# Patient Record
Sex: Male | Born: 1960
Health system: Southern US, Community
[De-identification: ages and names within clinical notes are randomized; demographics above are authoritative.]

## PROBLEM LIST (undated history)

## (undated) DIAGNOSIS — J45909 Unspecified asthma, uncomplicated: Secondary | ICD-10-CM

## (undated) DIAGNOSIS — M8700 Idiopathic aseptic necrosis of unspecified bone: Secondary | ICD-10-CM

## (undated) DIAGNOSIS — I10 Essential (primary) hypertension: Secondary | ICD-10-CM

## (undated) DIAGNOSIS — R519 Headache, unspecified: Secondary | ICD-10-CM

## (undated) DIAGNOSIS — M109 Gout, unspecified: Secondary | ICD-10-CM

## (undated) DIAGNOSIS — K219 Gastro-esophageal reflux disease without esophagitis: Secondary | ICD-10-CM

## (undated) DIAGNOSIS — J449 Chronic obstructive pulmonary disease, unspecified: Secondary | ICD-10-CM

## (undated) DIAGNOSIS — R51 Headache: Secondary | ICD-10-CM

## (undated) DIAGNOSIS — I509 Heart failure, unspecified: Secondary | ICD-10-CM

## (undated) DIAGNOSIS — M9390 Osteochondropathy, unspecified of unspecified site: Secondary | ICD-10-CM

## (undated) DIAGNOSIS — G4700 Insomnia, unspecified: Secondary | ICD-10-CM

## (undated) DIAGNOSIS — B182 Chronic viral hepatitis C: Secondary | ICD-10-CM

## (undated) DIAGNOSIS — T7840XA Allergy, unspecified, initial encounter: Secondary | ICD-10-CM

## (undated) DIAGNOSIS — M1610 Unilateral primary osteoarthritis, unspecified hip: Secondary | ICD-10-CM

## (undated) HISTORY — DX: Osteochondropathy, unspecified of unspecified site: M93.90

## (undated) HISTORY — DX: Chronic obstructive pulmonary disease, unspecified: J44.9

## (undated) HISTORY — DX: Idiopathic aseptic necrosis of unspecified bone: M87.00

## (undated) HISTORY — DX: Insomnia, unspecified: G47.00

## (undated) HISTORY — DX: Allergy, unspecified, initial encounter: T78.40XA

## (undated) HISTORY — DX: Gout, unspecified: M10.9

## (undated) HISTORY — DX: Essential (primary) hypertension: I10

## (undated) HISTORY — DX: Chronic viral hepatitis C: B18.2

---

## 2013-10-08 DIAGNOSIS — M8700 Idiopathic aseptic necrosis of unspecified bone: Secondary | ICD-10-CM

## 2013-10-08 HISTORY — DX: Idiopathic aseptic necrosis of unspecified bone: M87.00

## 2016-08-08 DEATH — deceased

## 2016-10-08 DIAGNOSIS — I509 Heart failure, unspecified: Secondary | ICD-10-CM

## 2016-10-08 HISTORY — DX: Heart failure, unspecified: I50.9

## 2017-07-12 ENCOUNTER — Encounter: Payer: Self-pay | Admitting: Family Medicine

## 2017-07-12 ENCOUNTER — Ambulatory Visit (INDEPENDENT_AMBULATORY_CARE_PROVIDER_SITE_OTHER): Payer: Medicaid Other | Admitting: Family Medicine

## 2017-07-12 VITALS — BP 136/90 | HR 67 | Temp 98.7°F | Resp 14 | Ht 67.0 in | Wt 117.0 lb

## 2017-07-12 DIAGNOSIS — Z23 Encounter for immunization: Secondary | ICD-10-CM | POA: Diagnosis not present

## 2017-07-12 DIAGNOSIS — Z8619 Personal history of other infectious and parasitic diseases: Secondary | ICD-10-CM

## 2017-07-12 DIAGNOSIS — Z13220 Encounter for screening for lipoid disorders: Secondary | ICD-10-CM | POA: Diagnosis not present

## 2017-07-12 DIAGNOSIS — Z131 Encounter for screening for diabetes mellitus: Secondary | ICD-10-CM

## 2017-07-12 DIAGNOSIS — Z125 Encounter for screening for malignant neoplasm of prostate: Secondary | ICD-10-CM

## 2017-07-12 DIAGNOSIS — Z1329 Encounter for screening for other suspected endocrine disorder: Secondary | ICD-10-CM | POA: Diagnosis not present

## 2017-07-12 DIAGNOSIS — J449 Chronic obstructive pulmonary disease, unspecified: Secondary | ICD-10-CM

## 2017-07-12 DIAGNOSIS — I10 Essential (primary) hypertension: Secondary | ICD-10-CM | POA: Diagnosis not present

## 2017-07-12 DIAGNOSIS — I509 Heart failure, unspecified: Secondary | ICD-10-CM | POA: Diagnosis not present

## 2017-07-12 MED ORDER — FLUTICASONE PROPIONATE (INHAL) 50 MCG/BLIST IN AEPB
1.0000 | INHALATION_SPRAY | Freq: Two times a day (BID) | RESPIRATORY_TRACT | 12 refills | Status: DC
Start: 2017-07-12 — End: 2017-07-16

## 2017-07-12 MED ORDER — ALBUTEROL SULFATE HFA 108 (90 BASE) MCG/ACT IN AERS: 2.0000 | INHALATION_SPRAY | RESPIRATORY_TRACT | 1 refills | Status: DC | PRN

## 2017-07-12 MED ORDER — CLONIDINE HCL 0.1 MG PO TABS
0.1000 mg | ORAL_TABLET | Freq: Once | ORAL | Status: AC
  Administered 2017-07-12: 0.1 mg via ORAL

## 2017-07-12 MED ORDER — LOSARTAN POTASSIUM 50 MG PO TABS: 50.0000 mg | ORAL_TABLET | Freq: Every day | ORAL | 3 refills | Status: DC

## 2017-07-12 MED ORDER — HYDROCHLOROTHIAZIDE 25 MG PO TABS: 25.0000 mg | ORAL_TABLET | Freq: Every day | ORAL | 3 refills | Status: DC
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
## 2017-07-12 NOTE — Patient Instructions (Signed)
COPD Continue albuterol inhaler 2 puffs every 4 hours as needed for shortness of breath or wheezing. Start Flovent inhaler 1 puff once daily, use everyday regardless of symptoms.  Hypertension Start Losartan and Hydrochlorothiazide .    Chronic Obstructive Pulmonary Disease Chronic obstructive pulmonary disease (COPD) is a long-term (chronic) lung problem. When you have COPD, it is hard for air to get in and out of your lungs. The way your lungs work will never return to normal. Usually the condition gets worse over time. There are things you can do to keep yourself as healthy as possible. Your doctor may treat your condition with:  Medicines.  Quitting smoking, if you smoke.  Rehabilitation. This may involve a team of specialists.  Oxygen.  Exercise and changes to your diet.  Lung surgery.  Comfort measures (palliative care).  Follow these instructions at home: Medicines  Take over-the-counter and prescription medicines only as told by your doctor.  Talk to your doctor before taking any cough or allergy medicines. You may need to avoid medicines that cause your lungs to be dry. Lifestyle  If you smoke, stop. Smoking makes the problem worse. If you need help quitting, ask your doctor.  Avoid being around things that make your breathing worse. This may include smoke, chemicals, and fumes.  Stay active, but remember to also rest.  Learn and use tips on how to relax.  Make sure you get enough sleep. Most adults need at least 7 hours a night.  Eat healthy foods. Eat smaller meals more often. Rest before meals. Controlled breathing  Learn and use tips on how to control your breathing as told by your doctor. Try: ? Breathing in (inhaling) through your nose for 1 second. Then, pucker your lips and breath out (exhale) through your lips for 2 seconds. ? Putting one hand on your belly (abdomen). Breathe in slowly through your nose for 1 second. Your hand on your belly should  move out. Pucker your lips and breathe out slowly through your lips. Your hand on your belly should move in as you breathe out. Controlled coughing  Learn and use controlled coughing to clear mucus from your lungs. The steps are: 1. Lean your head a little forward. 2. Breathe in deeply. 3. Try to hold your breath for 3 seconds. 4. Keep your mouth slightly open while coughing 2 times. 5. Spit any mucus out into a tissue. 6. Rest and do the steps again 1 or 2 times as needed. General instructions  Make sure you get all the shots (vaccines) that your doctor recommends. Ask your doctor about a flu shot and a pneumonia shot.  Use oxygen therapy and therapy to help improve your lungs (pulmonary rehabilitation) if told by your doctor. If you need home oxygen therapy, ask your doctor if you should buy a tool to measure your oxygen level (oximeter).  Make a COPD action plan with your doctor. This helps you know what to do if you feel worse than usual.  Manage any other conditions you have as told by your doctor.  Avoid going outside when it is very hot, cold, or humid.  Avoid people who have a sickness you can catch (contagious).  Keep all follow-up visits as told by your doctor. This is important. Contact a doctor if:  You cough up more mucus than usual.  There is a change in the color or thickness of the mucus.  It is harder to breathe than usual.  Your breathing is faster than  usual.  You have trouble sleeping.  You need to use your medicines more often than usual.  You have trouble doing your normal activities such as getting dressed or walking around the house. Get help right away if:  You have shortness of breath while resting.  You have shortness of breath that stops you from: ? Being able to talk. ? Doing normal activities.  Your chest hurts for longer than 5 minutes.  Your skin color is more blue than usual.  Your pulse oximeter shows that you have low oxygen for  longer than 5 minutes.  You have a fever.  You feel too tired to breathe normally. Summary  Chronic obstructive pulmonary disease (COPD) is a long-term lung problem.  The way your lungs work will never return to normal. Usually the condition gets worse over time. There are things you can do to keep yourself as healthy as possible.  Take over-the-counter and prescription medicines only as told by your doctor.  If you smoke, stop. Smoking makes the problem worse. This information is not intended to replace advice given to you by your health care provider. Make sure you discuss any questions you have with your health care provider. Document Released: 03/12/2008 Document Revised: 03/01/2016 Document Reviewed: 05/21/2013 Elsevier Interactive Patient Education  2017 [employer].

## 2017-07-13 LAB — BRAIN NATRIURETIC PEPTIDE: BRAIN NATRIURETIC PEPTIDE: 63 pg/mL (ref <100)

## 2017-07-16 ENCOUNTER — Telehealth: Payer: Self-pay

## 2017-07-16 ENCOUNTER — Other Ambulatory Visit: Payer: Self-pay | Admitting: Family Medicine

## 2017-07-16 MED ORDER — BUDESONIDE-FORMOTEROL FUMARATE 80-4.5 MCG/ACT IN AERO: 2.0000 | INHALATION_SPRAY | Freq: Two times a day (BID) | RESPIRATORY_TRACT | 12 refills | Status: DC

## 2017-07-16 NOTE — Telephone Encounter (Signed)
Insurance doesn't cover the [employer]

## 2017-07-16 NOTE — Progress Notes (Signed)
I have ordered Symbicort which according to [facility] list is covered .

## 2017-07-18 ENCOUNTER — Telehealth: Payer: Self-pay | Admitting: Family Medicine

## 2017-07-18 DIAGNOSIS — B182 Chronic viral hepatitis C: Secondary | ICD-10-CM

## 2017-07-18 NOTE — Telephone Encounter (Signed)
Contact patient to advise that I did confirm that he has hep C. He is being referred to infectious disease for further evaluation and treatment. Liver enzymes were abnormally elevated with recent lab work , please schedule him for a liver ultrasound and notify patient time for test.  Charles [name]. [name], MSN, FNP-C [facility]  [address] [place], [place] [address] [phone]

## 2017-07-18 NOTE — Telephone Encounter (Signed)
Letter will be sent out to patient to contact office as he does not have any phone numbers to contact him on.

## 2017-07-20 ENCOUNTER — Encounter: Payer: Self-pay | Admitting: Family Medicine

## 2017-07-20 DIAGNOSIS — Z8619 Personal history of other infectious and parasitic diseases: Secondary | ICD-10-CM | POA: Insufficient documentation

## 2017-07-20 DIAGNOSIS — J449 Chronic obstructive pulmonary disease, unspecified: Secondary | ICD-10-CM | POA: Insufficient documentation

## 2017-07-20 DIAGNOSIS — I10 Essential (primary) hypertension: Secondary | ICD-10-CM | POA: Insufficient documentation

## 2017-07-20 DIAGNOSIS — I509 Heart failure, unspecified: Secondary | ICD-10-CM | POA: Insufficient documentation

## 2017-07-23 LAB — HCV RNA,LIPA RFLX NS5A DRUG RESIST

## 2017-07-23 LAB — LIPID PANEL
CHOLESTEROL: 153 mg/dL (ref <200)
HDL: 104 mg/dL (ref >40)
LDL CHOLESTEROL (CALC): 32 mg/dL
Non-HDL Cholesterol (Calc): 49 mg/dL (calc) (ref <130)
Total CHOL/HDL Ratio: 1.5 (calc) (ref <5.0)
Triglycerides: 88 mg/dL (ref <150)

## 2017-07-23 LAB — COMPLETE METABOLIC PANEL WITH GFR
AG Ratio: 1 (calc) (ref 1.0–2.5)
ALKALINE PHOSPHATASE (APISO): 108 U/L (ref 40–115)
ALT: 103 U/L — AB (ref 9–46)
AST: 170 U/L — AB (ref 10–35)
Albumin: 3.8 g/dL (ref 3.6–5.1)
BUN/Creatinine Ratio: 13 (calc) (ref 6–22)
BUN: 9 mg/dL (ref 7–25)
CALCIUM: 9 mg/dL (ref 8.6–10.3)
CO2: 29 mmol/L (ref 20–32)
CREATININE: 0.69 mg/dL — AB (ref 0.70–1.33)
Chloride: 102 mmol/L (ref 98–110)
GFR, EST NON AFRICAN AMERICAN: 105 mL/min/{1.73_m2} (ref >=60)
GFR, Est African American: 122 mL/min/{1.73_m2} (ref >=60)
GLUCOSE: 109 mg/dL — AB (ref 65–99)
Globulin: 3.8 g/dL (calc) — ABNORMAL HIGH (ref 1.9–3.7)
Potassium: 4.1 mmol/L (ref 3.5–5.3)
SODIUM: 137 mmol/L (ref 135–146)
Total Bilirubin: 0.7 mg/dL (ref 0.2–1.2)
Total Protein: 7.6 g/dL (ref 6.1–8.1)

## 2017-07-23 LAB — CBC WITH DIFFERENTIAL/PLATELET
BASOS PCT: 1.4 %
Basophils Absolute: 39 cells/uL (ref 0–200)
EOS PCT: 1.1 %
Eosinophils Absolute: 31 cells/uL (ref 15–500)
HCT: 37.7 % — ABNORMAL LOW (ref 38.5–50.0)
Hemoglobin: 13.1 g/dL — ABNORMAL LOW (ref 13.2–17.1)
LYMPHS ABS: 1114 {cells}/uL (ref 850–3900)
MCH: 34.3 pg — ABNORMAL HIGH (ref 27.0–33.0)
MCHC: 34.7 g/dL (ref 32.0–36.0)
MCV: 98.7 fL (ref 80.0–100.0)
MPV: 9.7 fL (ref 7.5–12.5)
Monocytes Relative: 16.5 %
NEUTROS PCT: 41.2 %
Neutro Abs: 1154 cells/uL — ABNORMAL LOW (ref 1500–7800)
PLATELETS: 229 10*3/uL (ref 140–400)
RBC: 3.82 10*6/uL — ABNORMAL LOW (ref 4.20–5.80)
RDW: 13.9 % (ref 11.0–15.0)
TOTAL LYMPHOCYTE: 39.8 %
WBC: 2.8 10*3/uL — AB (ref 3.8–10.8)
WBCMIX: 462 {cells}/uL (ref 200–950)

## 2017-07-23 LAB — BRAIN NATRIURETIC PEPTIDE: Brain Natriuretic Peptide: 64 pg/mL (ref <100)

## 2017-07-23 LAB — HCV RNA NS5A DRUG RESISTANCE

## 2017-07-23 LAB — HEMOGLOBIN A1C
EAG (MMOL/L): 5.2 (calc)
HEMOGLOBIN A1C: 4.9 %{Hb} (ref <5.7)
MEAN PLASMA GLUCOSE: 94 (calc)

## 2017-07-23 LAB — THYROID PANEL WITH TSH
FREE THYROXINE INDEX: 2.1 (ref 1.4–3.8)
T3 UPTAKE: 33 % (ref 22–35)
T4 TOTAL: 6.5 ug/dL (ref 4.9–10.5)
TSH: 1.25 mIU/L (ref 0.40–4.50)

## 2017-07-23 LAB — HCV RNA, QUANT REAL-TIME PCR W/REFLEX
HCV RNA, PCR, QN (Log): 6.55 LogIU/mL — ABNORMAL HIGH
HCV RNA, PCR, QN: 3580000 [IU]/mL — AB

## 2017-07-23 LAB — PSA: PSA: 0.4 ng/mL (ref <=4.0)

## 2017-07-30 NOTE — Telephone Encounter (Signed)
Patient notified and will contact infectious disease to schedule appointment. Authorization has been submitted for the [place] and has to be reviewed

## 2017-08-05 ENCOUNTER — Ambulatory Visit (HOSPITAL_COMMUNITY): Admission: RE | Admit: 2017-08-05 | Payer: Medicaid Other | Source: Ambulatory Visit

## 2017-08-09 ENCOUNTER — Encounter: Payer: Self-pay | Admitting: Family Medicine

## 2017-08-09 ENCOUNTER — Ambulatory Visit (INDEPENDENT_AMBULATORY_CARE_PROVIDER_SITE_OTHER): Payer: Medicaid Other | Admitting: Family Medicine

## 2017-08-09 ENCOUNTER — Ambulatory Visit (HOSPITAL_COMMUNITY)
Admission: RE | Admit: 2017-08-09 | Discharge: 2017-08-09 | Disposition: A | Discharge status: Home / Self Care | Payer: Medicaid Other | Source: Ambulatory Visit | Attending: Family Medicine | Admitting: Family Medicine

## 2017-08-09 VITALS — BP 124/90 | HR 94 | Temp 98.6°F | Ht 67.0 in | Wt 124.0 lb

## 2017-08-09 DIAGNOSIS — M87852 Other osteonecrosis, left femur: Secondary | ICD-10-CM | POA: Insufficient documentation

## 2017-08-09 DIAGNOSIS — G8929 Other chronic pain: Secondary | ICD-10-CM

## 2017-08-09 DIAGNOSIS — M87052 Idiopathic aseptic necrosis of left femur: Secondary | ICD-10-CM | POA: Diagnosis not present

## 2017-08-09 DIAGNOSIS — I10 Essential (primary) hypertension: Secondary | ICD-10-CM

## 2017-08-09 DIAGNOSIS — Z23 Encounter for immunization: Secondary | ICD-10-CM

## 2017-08-09 DIAGNOSIS — M25561 Pain in right knee: Secondary | ICD-10-CM | POA: Diagnosis present

## 2017-08-09 DIAGNOSIS — M25552 Pain in left hip: Secondary | ICD-10-CM | POA: Diagnosis present

## 2017-08-09 DIAGNOSIS — J449 Chronic obstructive pulmonary disease, unspecified: Secondary | ICD-10-CM | POA: Diagnosis not present

## 2017-08-09 MED ORDER — LOSARTAN POTASSIUM 50 MG PO TABS: 50.0000 mg | ORAL_TABLET | Freq: Every day | ORAL | 3 refills | Status: DC

## 2017-08-09 MED ORDER — ALBUTEROL SULFATE HFA 108 (90 BASE) MCG/ACT IN AERS: 2.0000 | INHALATION_SPRAY | RESPIRATORY_TRACT | 3 refills | Status: DC | PRN

## 2017-08-09 MED ORDER — MELOXICAM 15 MG PO TABS: 15.0000 mg | ORAL_TABLET | Freq: Every day | ORAL | 0 refills | Status: DC

## 2017-08-09 MED ORDER — HYDROCHLOROTHIAZIDE 25 MG PO TABS: 25.0000 mg | ORAL_TABLET | Freq: Every day | ORAL | 3 refills | Status: DC

## 2017-08-09 NOTE — Patient Instructions (Signed)
I have prescribed Meloxicam for you leg and hip pain.  You will be notified by mail regarding your referral to orthopedics.    Chronic Obstructive Pulmonary Disease Chronic obstructive pulmonary disease (COPD) is a long-term (chronic) lung problem. When you have COPD, it is hard for air to get in and out of your lungs. The way your lungs work will never return to normal. Usually the condition gets worse over time. There are things you can do to keep yourself as healthy as possible. Your doctor may treat your condition with:  Medicines.  Quitting smoking, if you smoke.  Rehabilitation. This may involve a team of specialists.  Oxygen.  Exercise and changes to your diet.  Lung surgery.  Comfort measures (palliative care).  Follow these instructions at home: Medicines  Take over-the-counter and prescription medicines only as told by your doctor.  Talk to your doctor before taking any cough or allergy medicines. You may need to avoid medicines that cause your lungs to be dry. Lifestyle  If you smoke, stop. Smoking makes the problem worse. If you need help quitting, ask your doctor.  Avoid being around things that make your breathing worse. This may include smoke, chemicals, and fumes.  Stay active, but remember to also rest.  Learn and use tips on how to relax.  Make sure you get enough sleep. Most adults need at least 7 hours a night.  Eat healthy foods. Eat smaller meals more often. Rest before meals. Controlled breathing  Learn and use tips on how to control your breathing as told by your doctor. Try: ? Breathing in (inhaling) through your nose for 1 second. Then, pucker your lips and breath out (exhale) through your lips for 2 seconds. ? Putting one hand on your belly (abdomen). Breathe in slowly through your nose for 1 second. Your hand on your belly should move out. Pucker your lips and breathe out slowly through your lips. Your hand on your belly should move in as you  breathe out. Controlled coughing  Learn and use controlled coughing to clear mucus from your lungs. The steps are: 1. Lean your head a little forward. 2. Breathe in deeply. 3. Try to hold your breath for 3 seconds. 4. Keep your mouth slightly open while coughing 2 times. 5. Spit any mucus out into a tissue. 6. Rest and do the steps again 1 or 2 times as needed. General instructions  Make sure you get all the shots (vaccines) that your doctor recommends. Ask your doctor about a flu shot and a pneumonia shot.  Use oxygen therapy and therapy to help improve your lungs (pulmonary rehabilitation) if told by your doctor. If you need home oxygen therapy, ask your doctor if you should buy a tool to measure your oxygen level (oximeter).  Make a COPD action plan with your doctor. This helps you know what to do if you feel worse than usual.  Manage any other conditions you have as told by your doctor.  Avoid going outside when it is very hot, cold, or humid.  Avoid people who have a sickness you can catch (contagious).  Keep all follow-up visits as told by your doctor. This is important. Contact a doctor if:  You cough up more mucus than usual.  There is a change in the color or thickness of the mucus.  It is harder to breathe than usual.  Your breathing is faster than usual.  You have trouble sleeping.  You need to use your medicines more  often than usual.  You have trouble doing your normal activities such as getting dressed or walking around the house. Get help right away if:  You have shortness of breath while resting.  You have shortness of breath that stops you from: ? Being able to talk. ? Doing normal activities.  Your chest hurts for longer than 5 minutes.  Your skin color is more blue than usual.  Your pulse oximeter shows that you have low oxygen for longer than 5 minutes.  You have a fever.  You feel too tired to breathe normally. Summary  Chronic  obstructive pulmonary disease (COPD) is a long-term lung problem.  The way your lungs work will never return to normal. Usually the condition gets worse over time. There are things you can do to keep yourself as healthy as possible.  Take over-the-counter and prescription medicines only as told by your doctor.  If you smoke, stop. Smoking makes the problem worse. This information is not intended to replace advice given to you by your health care provider. Make sure you discuss any questions you have with your health care provider. Document Released: 03/12/2008 Document Revised: 03/01/2016 Document Reviewed: 05/21/2013 Elsevier Interactive Patient Education  2017 [employer].
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
## 2017-08-15 ENCOUNTER — Ambulatory Visit (HOSPITAL_COMMUNITY): Admission: RE | Admit: 2017-08-15 | Payer: Medicaid Other | Source: Ambulatory Visit

## 2017-08-21 ENCOUNTER — Ambulatory Visit (INDEPENDENT_AMBULATORY_CARE_PROVIDER_SITE_OTHER): Payer: Self-pay | Admitting: Surgery

## 2017-09-04 ENCOUNTER — Encounter: Payer: Medicaid Other | Admitting: Internal Medicine

## 2017-09-05 ENCOUNTER — Encounter: Payer: Medicaid Other | Admitting: Internal Medicine

## 2017-09-06 ENCOUNTER — Emergency Department (HOSPITAL_COMMUNITY)
Admission: EM | Admit: 2017-09-06 | Discharge: 2017-09-06 | Disposition: A | Discharge status: Home / Self Care | Payer: Medicaid Other | Attending: Emergency Medicine | Admitting: Emergency Medicine

## 2017-09-06 ENCOUNTER — Emergency Department (HOSPITAL_COMMUNITY): Payer: Medicaid Other

## 2017-09-06 ENCOUNTER — Encounter (HOSPITAL_COMMUNITY): Payer: Self-pay | Admitting: Emergency Medicine

## 2017-09-06 DIAGNOSIS — F17200 Nicotine dependence, unspecified, uncomplicated: Secondary | ICD-10-CM | POA: Diagnosis not present

## 2017-09-06 DIAGNOSIS — J449 Chronic obstructive pulmonary disease, unspecified: Secondary | ICD-10-CM | POA: Diagnosis not present

## 2017-09-06 DIAGNOSIS — M25561 Pain in right knee: Secondary | ICD-10-CM | POA: Diagnosis not present

## 2017-09-06 DIAGNOSIS — I110 Hypertensive heart disease with heart failure: Secondary | ICD-10-CM | POA: Insufficient documentation

## 2017-09-06 DIAGNOSIS — R079 Chest pain, unspecified: Secondary | ICD-10-CM | POA: Diagnosis present

## 2017-09-06 DIAGNOSIS — I509 Heart failure, unspecified: Secondary | ICD-10-CM | POA: Insufficient documentation

## 2017-09-06 DIAGNOSIS — Z79899 Other long term (current) drug therapy: Secondary | ICD-10-CM | POA: Diagnosis not present

## 2017-09-06 DIAGNOSIS — G8929 Other chronic pain: Secondary | ICD-10-CM | POA: Insufficient documentation

## 2017-09-06 DIAGNOSIS — M25552 Pain in left hip: Secondary | ICD-10-CM | POA: Insufficient documentation

## 2017-09-06 LAB — BASIC METABOLIC PANEL
Anion gap: 5 (ref 5–15)
BUN: 11 mg/dL (ref 6–20)
CO2: 26 mmol/L (ref 22–32)
Calcium: 7.7 mg/dL — ABNORMAL LOW (ref 8.9–10.3)
Chloride: 105 mmol/L (ref 101–111)
Creatinine, Ser: 0.95 mg/dL (ref 0.61–1.24)
GFR calc Af Amer: >60 mL/min (ref >60)
GFR calc non Af Amer: >60 mL/min (ref >60)
Glucose, Bld: 91 mg/dL (ref 65–99)
Potassium: 3.8 mmol/L (ref 3.5–5.1)
Sodium: 136 mmol/L (ref 135–145)

## 2017-09-06 LAB — I-STAT TROPONIN, ED
TROPONIN I, POC: 0 ng/mL (ref 0.00–0.08)
Troponin i, poc: 0 ng/mL (ref 0.00–0.08)

## 2017-09-06 LAB — CBC
HEMATOCRIT: 30.7 % — AB (ref 39.0–52.0)
Hemoglobin: 10.6 g/dL — ABNORMAL LOW (ref 13.0–17.0)
MCH: 33.1 pg (ref 26.0–34.0)
MCHC: 34.5 g/dL (ref 30.0–36.0)
MCV: 95.9 fL (ref 78.0–100.0)
PLATELETS: 162 10*3/uL (ref 150–400)
RBC: 3.2 MIL/uL — AB (ref 4.22–5.81)
RDW: 14.4 % (ref 11.5–15.5)
WBC: 4.7 10*3/uL (ref 4.0–10.5)

## 2017-09-06 LAB — HEPATIC FUNCTION PANEL
ALBUMIN: 2.6 g/dL — AB (ref 3.5–5.0)
ALT: 54 U/L (ref 17–63)
AST: 53 U/L — ABNORMAL HIGH (ref 15–41)
Alkaline Phosphatase: 75 U/L (ref 38–126)
BILIRUBIN INDIRECT: 0.2 mg/dL — AB (ref 0.3–0.9)
Bilirubin, Direct: 0.3 mg/dL (ref 0.1–0.5)
TOTAL PROTEIN: 5.9 g/dL — AB (ref 6.5–8.1)
Total Bilirubin: 0.5 mg/dL (ref 0.3–1.2)

## 2017-09-06 LAB — RETICULOCYTES
RBC.: 3.14 MIL/uL — AB (ref 4.22–5.81)
RETIC CT PCT: 2.4 % (ref 0.4–3.1)
Retic Count, Absolute: 75.4 10*3/uL (ref 19.0–186.0)

## 2017-09-06 LAB — BRAIN NATRIURETIC PEPTIDE: B NATRIURETIC PEPTIDE 5: 43.9 pg/mL (ref 0.0–100.0)

## 2017-09-06 MED ORDER — ASPIRIN 81 MG PO CHEW
324.0000 mg | CHEWABLE_TABLET | Freq: Once | ORAL | Status: AC
  Administered 2017-09-06: 324 mg via ORAL
  Filled 2017-09-06: qty 4

## 2017-09-06 MED ORDER — TRAMADOL HCL 50 MG PO TABS
50.0000 mg | ORAL_TABLET | Freq: Once | ORAL | Status: AC
  Administered 2017-09-06: 50 mg via ORAL
  Filled 2017-09-06: qty 1

## 2017-09-06 NOTE — ED Provider Notes (Signed)
[facility] EMERGENCY DEPARTMENT Provider Note   CSN: [id number] Arrival date & time: 09/06/17  1106     History   Chief Complaint Chief Complaint  Patient presents with  . Chest Pain    HPI Charles [name] is a 56 y.o. male.  HPI   Presents with chest pain, reports "feels like my heart is swollen" Feeling lightheaded, afraid will fall in shower because of bad knee and hip Feels like heart fluttering and swelling.   When eat rice, potatoes, makes heart hurt really bad.  Feels like fluttering,  Spitting" pain for the last month or two, has been constant over the last month or two and has been getting too worse.  Reports discomfort is in left chest, no radiation. Neck sore all of the time. Has had some left arm heaviness yesterday.   Nothing else seems to make it better or worse. Didn't know he was like this until this year.  Had pneumonia in October and thinks it is trying to flare back up again. Coughing also. Reports at night will have sweats for 2 years.   PCP now at [facility]  Hx of hip and knee pains Discussing concern regarding chronic hip and knee pains frequently during history, has hx of these, diagnosed with avascular necrosis and had right knee disease, unable to see orthopedic physician as the arranged ride did not come get him.  Reports taking meds rx by PCP but continuing to have severe pain. Has had pain since prior accident years ago.  No new trauma.  Htn, anemia, COPD, asthma, smoking. Does report sickle cell but has not seen hematologist and reports recent diagnosis.      Past Medical History:  Diagnosis Date  . Sickle cell anemia [facility])     Patient Active Problem List   Diagnosis Date Noted  . Essential hypertension 07/20/2017  . Hx of hepatitis C 07/20/2017  . Heart failure (HCC) 07/20/2017  . Chronic obstructive pulmonary disease (HCC) 07/20/2017    History reviewed. No pertinent surgical history.     Home Medications     Prior to Admission medications   Medication Sig Start Date End Date Taking? Authorizing Provider  albuterol (PROVENTIL HFA;VENTOLIN HFA) 108 (90 Base) MCG/ACT inhaler Inhale 2 puffs into the lungs every 4 (four) hours as needed for wheezing or shortness of breath (cough, shortness of breath or wheezing.). 08/09/17  Yes Bing Neighbors, FNP  budesonide-formoterol (SYMBICORT) 80-4.5 MCG/ACT inhaler Inhale 2 puffs into the lungs 2 (two) times daily. 07/16/17  Yes Bing Neighbors, FNP  hydrochlorothiazide (HYDRODIURIL) 25 MG tablet Take 1 tablet (25 mg total) by mouth daily. 08/09/17  Yes Bing Neighbors, FNP  losartan (COZAAR) 50 MG tablet Take 1 tablet (50 mg total) by mouth daily. 08/09/17  Yes Bing Neighbors, FNP  meloxicam (MOBIC) 15 MG tablet Take 1 tablet (15 mg total) by mouth daily. 08/09/17  Yes Bing Neighbors, FNP    Family History No family history on file.  Social History Social History   Tobacco Use  . Smoking status: Current Every Day Smoker  . Smokeless tobacco: Never Used  Substance Use Topics  . Alcohol use: No  . Drug use: No     Allergies   Patient has no known allergies.   Review of Systems Review of Systems  Constitutional: Positive for fatigue. Negative for fever.  HENT: Negative for sore throat.   Eyes: Negative for visual disturbance.  Respiratory: Positive for cough (weeks) and  shortness of breath.   Cardiovascular: Positive for chest pain. Negative for leg swelling.  Gastrointestinal: Positive for nausea and vomiting. Negative for abdominal pain and diarrhea.  Genitourinary: Negative for difficulty urinating and dysuria.  Musculoskeletal: Positive for arthralgias. Negative for back pain and neck stiffness.  Skin: Negative for rash.  Neurological: Positive for light-headedness. Negative for syncope and headaches.     Physical Exam Updated Vital Signs BP 106/66   Pulse 99   Temp (!) 97.5 F (36.4 C) (Oral)   Resp 11   SpO2 100%    Physical Exam  Constitutional: He is oriented to person, place, and time. He appears well-developed and well-nourished. No distress.  HENT:  Head: Normocephalic and atraumatic.  Eyes: Conjunctivae and EOM are normal.  Neck: Normal range of motion.  Cardiovascular: Normal rate, regular rhythm, normal heart sounds and intact distal pulses. Exam reveals no gallop and no friction rub.  No murmur heard. Pulmonary/Chest: Effort normal. No respiratory distress. He has wheezes (occasional wheezes). He has no rales.  Abdominal: Soft. He exhibits no distension. There is no tenderness. There is no guarding.  Musculoskeletal: He exhibits no edema.  Neurological: He is alert and oriented to person, place, and time.  Skin: Skin is warm and dry. He is not diaphoretic.  Nursing note and vitals reviewed.    ED Treatments / Results  Labs (all labs ordered are listed, but only abnormal results are displayed) Labs Reviewed  BASIC METABOLIC PANEL - Abnormal; Notable for the following components:      Result Value   Calcium 7.7 (*)    All other components within normal limits  CBC - Abnormal; Notable for the following components:   RBC 3.20 (*)    Hemoglobin 10.6 (*)    HCT 30.7 (*)    All other components within normal limits  RETICULOCYTES - Abnormal; Notable for the following components:   RBC. 3.14 (*)    All other components within normal limits  HEPATIC FUNCTION PANEL - Abnormal; Notable for the following components:   Total Protein 5.9 (*)    Albumin 2.6 (*)    AST 53 (*)    Indirect Bilirubin 0.2 (*)    All other components within normal limits  BRAIN NATRIURETIC PEPTIDE  I-STAT TROPONIN, ED  I-STAT TROPONIN, ED    EKG  EKG Interpretation  Date/Time:  Friday September 06 2017 11:20:39 EST Ventricular Rate:  75 PR Interval:    QRS Duration: 93 QT Interval:  411 QTC Calculation: 460 R Axis:   77 Text Interpretation:  Normal sinus rhythm Minimal ST elevation, anterior leads No  previous ECGs available Confirmed by Alvira MondaySchlossman, Mel Tadros ([id number]) on 09/06/2017 12:38:56 PM       Radiology Dg Chest 2 View  Result Date: 09/06/2017 CLINICAL DATA:  Shortness of breath.  Chest pain. EXAM: CHEST  2 VIEW COMPARISON:  06/06/2017 and 08/31/2014 FINDINGS: The heart size and mediastinal contours are within normal limits. Both lungs are clear. Old healed fracture of the left sixth rib. No significant bone abnormality. IMPRESSION: No active cardiopulmonary disease. Electronically Signed   By: Francene BoyersJames  Maxwell M.D.   On: 09/06/2017 12:11    Procedures Procedures (including critical care time)  Medications Ordered in ED Medications  aspirin chewable tablet 324 mg (324 mg Oral Given 09/06/17 1314)  traMADol (ULTRAM) tablet 50 mg (50 mg Oral Given 09/06/17 1314)     Initial Impression / Assessment and Plan / ED Course  I have reviewed the triage vital signs  and the nursing notes.  Pertinent labs & imaging results that were available during my care of the patient were reviewed by me and considered in my medical decision making (see chart for details).     56yo male with history of htn, HF, possible sickle cell (although unclear, has not seen hematologist reports diagnosis in adulthood) presents with concern for chest pain over the last few months, worsening today.  EKG without significant findings.  Troponin negative x2, with months of atypical pain/"fluttering", nonexertional. Recommend outpatient follow up for chest pain.  Low suspicion for PE, dissection by history and physical exam.  Given presence of cough, viral etiology. No resp distress, doubt COPD exacerbation. No sign of Acute Chest and unclear if pt has true diagnosis of sickle cell on review of PCP note and available information.  Recommend close follow up with PCP.  Patient with concern regarding chronic hip and knee pain he has had for years. No new trauma.  Has avascular necrosis. Given crutches, knee sleeve, recommend outpt  follow up with orthopedics. Patient discharged in stable condition with understanding of reasons to return.   Final Clinical Impressions(s) / ED Diagnoses   Final diagnoses:  Chest pain, unspecified type  Chronic left hip pain  Chronic pain of right knee    ED Discharge Orders    None       Alvira MondaySchlossman, Tashea Othman, MD 09/06/17 2056

## 2017-09-06 NOTE — ED Triage Notes (Addendum)
Pt arrives via EMS from home with complaints of sudden central chest pain that radiating down left arm and neck. Pt endorses hx of sickle cell. Has been feeling weak for 2-3 weeks, N/V, and feels his "blood is low". EMS gave 325 ASA, nitro and 500 cc NS bolus

## 2017-09-06 NOTE — Progress Notes (Signed)
Orthopedic Tech Progress Note Patient Details:  Charles AbuCarl Maser Nov 29, 1960 [id number]  Ortho Devices Type of Ortho Device: Crutches, Knee Sleeve Ortho Device/Splint Location: Right Ortho Device/Splint Interventions: Application, Adjustment   Alvina ChouWilliams, Daequan Kozma C 09/06/2017, 3:42 PM

## 2017-10-08 DIAGNOSIS — Z96651 Presence of right artificial knee joint: Secondary | ICD-10-CM

## 2017-10-08 HISTORY — DX: Presence of right artificial knee joint: Z96.651

## 2017-11-11 ENCOUNTER — Encounter: Payer: Self-pay | Admitting: Family Medicine

## 2017-11-11 ENCOUNTER — Ambulatory Visit (INDEPENDENT_AMBULATORY_CARE_PROVIDER_SITE_OTHER): Payer: Medicaid Other | Admitting: Family Medicine

## 2017-11-11 VITALS — BP 130/84 | HR 84 | Temp 98.8°F | Resp 16 | Ht 67.0 in | Wt 134.0 lb

## 2017-11-11 DIAGNOSIS — I10 Essential (primary) hypertension: Secondary | ICD-10-CM

## 2017-11-11 DIAGNOSIS — M87052 Idiopathic aseptic necrosis of left femur: Secondary | ICD-10-CM

## 2017-11-11 DIAGNOSIS — M109 Gout, unspecified: Secondary | ICD-10-CM | POA: Diagnosis not present

## 2017-11-11 DIAGNOSIS — J449 Chronic obstructive pulmonary disease, unspecified: Secondary | ICD-10-CM

## 2017-11-11 LAB — POCT URINALYSIS DIP (DEVICE)
Bilirubin Urine: NEGATIVE
GLUCOSE, UA: NEGATIVE mg/dL
Hgb urine dipstick: NEGATIVE
Ketones, ur: NEGATIVE mg/dL
Leukocytes, UA: NEGATIVE
Nitrite: NEGATIVE
PROTEIN: NEGATIVE mg/dL
SPECIFIC GRAVITY, URINE: 1.015 (ref 1.005–1.030)
UROBILINOGEN UA: 0.2 mg/dL (ref 0.0–1.0)
pH: 7 (ref 5.0–8.0)

## 2017-11-11 MED ORDER — MELOXICAM 15 MG PO TABS: 15.0000 mg | ORAL_TABLET | Freq: Every day | ORAL | 0 refills | Status: DC

## 2017-11-11 MED ORDER — LOSARTAN POTASSIUM 50 MG PO TABS: 50.0000 mg | ORAL_TABLET | Freq: Every day | ORAL | 3 refills | Status: DC

## 2017-11-11 MED ORDER — HYDROCHLOROTHIAZIDE 25 MG PO TABS: 25.0000 mg | ORAL_TABLET | Freq: Every day | ORAL | 3 refills | Status: DC

## 2017-11-11 NOTE — Patient Instructions (Addendum)
Contact [facility] to schedule treatment for Hep C- [address] [place], [place] [address] [phone]  Orthopedics call to schedule an appointment at: [address], [place], [place] [address] [address] [place], [place], [place] [address]        Gout Gout is painful swelling that can happen in some of your joints. Gout is a type of arthritis. This condition is caused by having too much uric acid in your body. Uric acid is a chemical that is made when your body breaks down substances called purines. If your body has too much uric acid, sharp crystals can form and build up in your joints. This causes pain and swelling. Gout attacks can happen quickly and be very painful (acute gout). Over time, the attacks can affect more joints and happen more often (chronic gout). Follow these instructions at home: During a Gout Attack  If directed, put ice on the painful area: ? Put ice in a plastic bag. ? Place a towel between your skin and the bag. ? Leave the ice on for 20 minutes, 2-3 times a day.  Rest the joint as much as possible. If the joint is in your leg, you may be given crutches to use.  Raise (elevate) the painful joint above the level of your heart as often as you can.  Drink enough fluids to keep your pee (urine) clear or pale yellow.  Take over-the-counter and prescription medicines only as told by your doctor.  Do not drive or use heavy machinery while taking prescription pain medicine.  Follow instructions from your doctor about what you can or cannot eat and drink.  Return to your normal activities as told by your doctor. Ask your doctor what activities are safe for you. Avoiding Future Gout Attacks  Follow a low-purine diet as told by a specialist (dietitian) or your doctor. Avoid foods and drinks that have a lot of purines, such as: ? Liver. ? Kidney. ? Anchovies. ? Asparagus. ? Herring. ? Mushrooms ? Mussels. ? Beer.  Limit alcohol intake  to no more than 1 drink a day for nonpregnant women and 2 drinks a day for men. One drink equals 12 oz of beer, 5 oz of wine, or 1 oz of hard liquor.  Stay at a healthy weight or lose weight if you are overweight. If you want to lose weight, talk with your doctor. It is important that you do not lose weight too fast.  Start or continue an exercise plan as told by your doctor.  Drink enough fluids to keep your pee clear or pale yellow.  Take over-the-counter and prescription medicines only as told by your doctor.  Keep all follow-up visits as told by your doctor. This is important. Contact a doctor if:  You have another gout attack.  You still have symptoms of a gout attack after10 days of treatment.  You have problems (side effects) because of your medicines.  You have chills or a fever.  You have burning pain when you pee (urinate).  You have pain in your lower back or belly. Get help right away if:  You have very bad pain.  Your pain cannot be controlled.  You cannot pee. This information is not intended to replace advice given to you by your health care provider. Make sure you discuss any questions you have with your health care provider. Document Released: 07/03/2008 Document Revised: 03/01/2016 Document Reviewed: 07/07/2015 Elsevier Interactive Patient Education  [employer].  Low-Purine Diet Purines are compounds that affect the level of uric acid in your body. A low-purine diet is a diet that is low in purines. Eating a low-purine diet can prevent the level of uric acid in your body from getting too high and causing gout or kidney stones or both. What do I need to know about this diet?  Choose low-purine foods. Examples of low-purine foods are listed in the next section.  Drink plenty of fluids, especially water. Fluids can help remove uric acid from your body. Try to drink 8-16 cups (1.9-3.8 L) a day.  Limit foods high in fat, especially saturated fat,  as fat makes it harder for the body to get rid of uric acid. Foods high in saturated fat include pizza, cheese, ice cream, whole milk, fried foods, and gravies. Choose foods that are lower in fat and lean sources of protein. Use olive oil when cooking as it contains healthy fats that are not high in saturated fat.  Limit alcohol. Alcohol interferes with the elimination of uric acid from your body. If you are having a gout attack, avoid all alcohol.  Keep in mind that different people's bodies react differently to different foods. You will probably learn over time which foods do or do not affect you. If you discover that a food tends to cause your gout to flare up, avoid eating that food. You can more freely enjoy foods that do not cause problems. If you have any questions about a food item, talk to your dietitian or health care provider. Which foods are low, moderate, and high in purines? The following is a list of foods that are low, moderate, and high in purines. You can eat any amount of the foods that are low in purines. You may be able to have small amounts of foods that are moderate in purines. Ask your health care provider how much of a food moderate in purines you can have. Avoid foods high in purines. Grains  Foods low in purines: Enriched white bread, pasta, rice, cake, cornbread, popcorn.  Foods moderate in purines: Whole-grain breads and cereals, wheat germ, bran, oatmeal. Uncooked oatmeal. Dry wheat bran or wheat germ.  Foods high in purines: Pancakes, [place] toast, biscuits, muffins. Vegetables  Foods low in purines: All vegetables, except those that are moderate in purines.  Foods moderate in purines: Asparagus, cauliflower, spinach, mushrooms, green peas. Fruits  All fruits are low in purines. Meats and other Protein Foods  Foods low in purines: Eggs, nuts, peanut butter.  Foods moderate in purines: 80-90% lean beef, lamb, veal, pork, poultry, fish, eggs, peanut butter,  nuts. Crab, lobster, oysters, and shrimp. Cooked dried beans, peas, and lentils.  Foods high in purines: Anchovies, sardines, herring, mussels, tuna, codfish, scallops, trout, and haddock. Charles [name]. Organ meats (such as liver or kidney). Tripe. Game meat. Goose. Sweetbreads. Dairy  All dairy foods are low in purines. Low-fat and fat-free dairy products are best because they are low in saturated fat. Beverages  Drinks low in purines: Water, carbonated beverages, tea, coffee, cocoa.  Drinks moderate in purines: Soft drinks and other drinks sweetened with high-fructose corn syrup. Juices. To find whether a food or drink is sweetened with high-fructose corn syrup, look at the ingredients list.  Drinks high in purines: Alcoholic beverages (such as beer). Condiments  Foods low in purines: Salt, herbs, olives, pickles, relishes, vinegar.  Foods moderate in purines: Butter, margarine, oils, mayonnaise. Fats and Oils  Foods low in purines: All types, except  gravies and sauces made with meat.  Foods high in purines: Gravies and sauces made with meat. Other Foods  Foods low in purines: Sugars, sweets, gelatin. Cake. Soups made without meat.  Foods moderate in purines: Meat-based or fish-based soups, broths, or bouillons. Foods and drinks sweetened with high-fructose corn syrup.  Foods high in purines: High-fat desserts (such as ice cream, cookies, cakes, pies, doughnuts, and chocolate). Contact your dietitian for more information on foods that are not listed here. This information is not intended to replace advice given to you by your health care provider. Make sure you discuss any questions you have with your health care provider. Document Released: 01/19/2011 Document Revised: 03/01/2016 Document Reviewed: 08/31/2013 Elsevier Interactive Patient Education  2017 [employer]

## 2017-11-11 NOTE — Progress Notes (Signed)
Patient ID: Charles [name], male    DOB: 10-13-1960, 57 y.o.   MRN: [id number]  PCP: Bing NeighborsHarris, Meshia Rau S, FNP  Chief Complaint  Patient presents with  . Follow-up    3 month n chronic condition    Subjective:  HPI Charles [name] is a 57 y.o. male with a history of COPD, HTN, AVN,  Hep C, substance abuse, presents for evaluation of chronic conditions. Reports that he is no longer living in shelter and is now residing in [place].  He complains of mild wheezing, and shortness of breath secondary to COPD.  He complains of a cough which mostly occurs at nighttime which is nonproductive.  He sleeps with his head elevated at night improve ventilation.  Reports compliance with all medications.  Frequently uses his rescue inhaler.  Unfortunately he continues to smoke although endorses attempt to try and cut back. During Kollen's prior visit he was referred to [facility] for treatment and evaluation of hep C infection and referred to [facility] for evaluation of avascular necrosis of the left femoral head, however he reports that he does not have a telephone and was unaware of these appointments.  He also reports recently being evaluated and diagnosed with gout at a community clinic.  Gout was localized to the left great toe and he was started on emanation treatment with colchicine and allopurinol.  He also complains of left bunion pain which is interfering with walking and wearing shoes as both cause severe left lateral foot pain. He has had no prior follow-up with podiatry.  He denies chest pain, new weakness, headache, dizziness, or worsening shortness of breath compared to normal baseline dyspnea.  Reports that he continues to be alcohol free and participates in classes daily every week. Social History   Socioeconomic History  . Marital status: Single    Spouse name: Not on file  . Number of children: Not on file  . Years of education: Not on file  . Highest education level: Not  on file  Social Needs  . Financial resource strain: Not on file  . Food insecurity - worry: Not on file  . Food insecurity - inability: Not on file  . Transportation needs - medical: Not on file  . Transportation needs - non-medical: Not on file  Occupational History  . Not on file  Tobacco Use  . Smoking status: Current Every Day Smoker  . Smokeless tobacco: Never Used  Substance and Sexual Activity  . Alcohol use: No  . Drug use: No  . Sexual activity: Not on file  Other Topics Concern  . Not on file  Social History Narrative  . Not on file    Family History  Problem Relation Age of Onset  . Hypertension Mother    Review of Systems  HENT: Negative.   Eyes:       Self-reports yellowing of sclera bilaterally  Respiratory: Positive for cough, shortness of breath and wheezing.   Cardiovascular: Negative.   Genitourinary: Negative.   Musculoskeletal: Positive for arthralgias.       Left hip pain.  Left foot pain  Neurological: Negative.   Psychiatric/Behavioral: Negative.  Negative for self-injury, sleep disturbance and suicidal ideas. The patient is not nervous/anxious.    Patient Active Problem List   Diagnosis Date Noted  . Essential hypertension 07/20/2017  . Hx of hepatitis C 07/20/2017  . Heart failure (HCC) 07/20/2017  . Chronic obstructive pulmonary disease (HCC) 07/20/2017   No Known Allergies  Prior  to Admission medications   Medication Sig Start Date End Date Taking? Authorizing Provider  albuterol (PROVENTIL HFA;VENTOLIN HFA) 108 (90 Base) MCG/ACT inhaler Inhale 2 puffs into the lungs every 4 (four) hours as needed for wheezing or shortness of breath (cough, shortness of breath or wheezing.). 08/09/17  Yes Bing Neighbors, FNP  allopurinol (ZYLOPRIM) 100 MG tablet Take 100 mg by mouth daily.   Yes [provider]  budesonide-formoterol (SYMBICORT) 80-4.5 MCG/ACT inhaler Inhale 2 puffs into the lungs 2 (two) times daily. 07/16/17  Yes Bing Neighbors, FNP  Colchicine (MITIGARE) 0.6 MG CAPS Take by mouth.   Yes [provider]  hydrochlorothiazide (HYDRODIURIL) 25 MG tablet Take 1 tablet (25 mg total) by mouth daily. 08/09/17  Yes Bing Neighbors, FNP  losartan (COZAAR) 50 MG tablet Take 1 tablet (50 mg total) by mouth daily. 08/09/17  Yes Bing Neighbors, FNP  meloxicam (MOBIC) 15 MG tablet Take 1 tablet (15 mg total) by mouth daily. 08/09/17  Yes Bing Neighbors, FNP  tamsulosin (FLOMAX) 0.4 MG CAPS capsule Take 0.4 mg by mouth.   Yes [provider]  Past Medical, Surgical Family and Social History reviewed and updated.  Objective:   Today's Vitals   11/11/17 1011  BP: 130/84  Pulse: 84  Resp: 16  Temp: 98.8 F (37.1 C)  TempSrc: Oral  SpO2: 100%  Weight: 134 lb (60.8 kg)  Height: 5\' 7"  (1.702 m)    Wt Readings from Last 3 Encounters:  11/11/17 134 lb (60.8 kg)  08/09/17 124 lb (56.2 kg)  07/12/17 117 lb (53.1 kg)    Physical Exam Constitutional: He is oriented to person, place, and time. He appears well-developed. He appears cachectic.  HENT: Head: Normocephalic.  Eyes: Pupils are equal, round, and reactive to light. Mild scleral icterus.  Neck: Normal range of motion. Neck supple.  Cardiovascular: Normal rate, regular rhythm, normal heart sounds and intact distal pulses.   Pulmonary/Chest: Effort normal and breath sounds normal. He has no wheezes.  Abdominal: Soft. Bowel sounds are normal.  Musculoskeletal: Left hip: He exhibits decreased strength and tenderness.    Neurological: He is alert and oriented to person, place, and time.  Skin: Skin is warm and dry.  Psychiatric: He has a normal mood and affect. His behavior is normal. Judgment and thought content normal.   Assessment & Plan:  1. Avascular necrosis of left femoral head (HCC), diagnosed at prior visit and confirmed by imaging.  Patient was referred to [facility], however did not follow-up as he does not have a  telephone.  I provided him with the phone number [facility] and advised him to follow-up as soon as possible to schedule an initial visit.  2. Essential hypertension, well controlled today we will continue current regimen.  Checking a CMP today to evaluate electrolyte status. We have discussed target BP range and blood pressure goal. I have advised patient to check BP regularly and to call us back or report to clinic if the numbers are consistently higher than 140/90. We discussed the importance of compliance with medical therapy and DASH diet recommended, consequences of uncontrolled hypertension discussed.    -3. Chronic obstructive pulmonary disease, unspecified COPD type (HCC) currently symptoms are active.  Patient encouraged to completely discontinue smoking as this is likely exacerbating his symptoms.  On exam today he is negative for COPD exacerbation therefore prednisone is not indicated.  Advised consistent use of prescribed medications LABA and SABAevery 4-6 hours  as needed for wheezing, shortness of breath, or cough.   4. Gout, unspecified cause, unspecified chronicity, unspecified site, obtaining a uric acid level today.  Uncertain of where patient was diagnosed with gout.  He is continuing colchicine and allopurinol. We will also obtain a CBC.  Patient has been difficult to reach as he does not have a telephone.  I have provided him today with a telephone number for [facility] as well as [facility] in order for him to follow-up on referrals previously placed.   Return for care in 6 months chronic condition management.    Orders Placed This Encounter  Procedures  . Uric Acid  . Comprehensive metabolic panel  . CBC with Differential  . POCT urinalysis dip (device)    Meds ordered this encounter  Medications  . meloxicam (MOBIC) 15 MG tablet    Sig: Take 1 tablet (15 mg total) by mouth daily.    Dispense:  30 tablet    Refill:  0    Order  Specific Question:   Supervising Provider    Answer:   Quentin Angst [id number]  . hydrochlorothiazide (HYDRODIURIL) 25 MG tablet    Sig: Take 1 tablet (25 mg total) by mouth daily.    Dispense:  90 tablet    Refill:  3    Order Specific Question:   Supervising Provider    Answer:   Quentin Angst [id number]  . losartan (COZAAR) 50 MG tablet    Sig: Take 1 tablet (50 mg total) by mouth daily.    Dispense:  90 tablet    Refill:  3    Order Specific Question:   Supervising Provider    Answer:   Quentin Angst [[id number]]     EXBMWUXL K. [name], MSN, FNP-C [facility]  [address] [place], [place] [address] [phone]

## 2017-11-12 LAB — CBC WITH DIFFERENTIAL/PLATELET
BASOS ABS: 0 10*3/uL (ref 0.0–0.2)
Basos: 1 %
EOS (ABSOLUTE): 0 10*3/uL (ref 0.0–0.4)
Eos: 1 %
HEMOGLOBIN: 13.5 g/dL (ref 13.0–17.7)
Hematocrit: 38.8 % (ref 37.5–51.0)
IMMATURE GRANS (ABS): 0 10*3/uL (ref 0.0–0.1)
IMMATURE GRANULOCYTES: 0 %
LYMPHS: 28 %
Lymphocytes Absolute: 1.5 10*3/uL (ref 0.7–3.1)
MCH: 33.4 pg — ABNORMAL HIGH (ref 26.6–33.0)
MCHC: 34.8 g/dL (ref 31.5–35.7)
MCV: 96 fL (ref 79–97)
MONOCYTES: 12 %
Monocytes Absolute: 0.7 10*3/uL (ref 0.1–0.9)
NEUTROS PCT: 58 %
Neutrophils Absolute: 3.1 10*3/uL (ref 1.4–7.0)
PLATELETS: 270 10*3/uL (ref 150–379)
RBC: 4.04 x10E6/uL — ABNORMAL LOW (ref 4.14–5.80)
RDW: 14 % (ref 12.3–15.4)
WBC: 5.3 10*3/uL (ref 3.4–10.8)

## 2017-11-12 LAB — COMPREHENSIVE METABOLIC PANEL
A/G RATIO: 1.1 — AB (ref 1.2–2.2)
ALT: 25 IU/L (ref 0–44)
AST: 30 IU/L (ref 0–40)
Albumin: 3.9 g/dL (ref 3.5–5.5)
Alkaline Phosphatase: 88 IU/L (ref 39–117)
BILIRUBIN TOTAL: 0.2 mg/dL (ref 0.0–1.2)
BUN/Creatinine Ratio: 10 (ref 9–20)
BUN: 8 mg/dL (ref 6–24)
CALCIUM: 9.4 mg/dL (ref 8.7–10.2)
CHLORIDE: 97 mmol/L (ref 96–106)
CO2: 28 mmol/L (ref 20–29)
Creatinine, Ser: 0.78 mg/dL (ref 0.76–1.27)
GFR calc Af Amer: 115 mL/min/{1.73_m2} (ref >59)
GFR, EST NON AFRICAN AMERICAN: 99 mL/min/{1.73_m2} (ref >59)
GLUCOSE: 96 mg/dL (ref 65–99)
Globulin, Total: 3.6 g/dL (ref 1.5–4.5)
POTASSIUM: 3.6 mmol/L (ref 3.5–5.2)
Sodium: 138 mmol/L (ref 134–144)
TOTAL PROTEIN: 7.5 g/dL (ref 6.0–8.5)

## 2017-11-12 LAB — URIC ACID: URIC ACID: 6.2 mg/dL (ref 3.7–8.6)

## 2017-11-20 ENCOUNTER — Telehealth: Payer: Self-pay

## 2017-11-20 ENCOUNTER — Telehealth: Payer: Self-pay | Admitting: Family Medicine

## 2017-11-20 DIAGNOSIS — B1710 Acute hepatitis C without hepatic coma: Secondary | ICD-10-CM

## 2017-11-20 NOTE — Telephone Encounter (Signed)
Patient would like a referral for cardiology and would also like labs showing that he is negative for sickle cell.

## 2017-11-20 NOTE — Telephone Encounter (Signed)
Patient has been referred to orthopedics and will need an infectious disease referral however, he was to call with contact information so that appointments could be scheduled. He has already been referred to Orthopedics and provided there phone number to schedule an appointment. There is a phone number listed on his EMR now and I will proceed with infectious disease referral for evaluation and treatment of hep c. A cardiology referral has not been discussed or warranted per my recent evaluation of patient. He has not been tested for sickle cell disease as he has not history of anemia or any symptoms that warrant a work up for sickle cell disease. We discussed at last visit our office in the past was [facility] and his referral to this clinic doesn't indicate that he has sickle cell disease.  Godfrey PickKimberly S. Tiburcio PeaHarris, MSN, FNP-C [facility]  [address] [place], [place] [address] [phone]

## 2017-11-25 NOTE — Progress Notes (Deleted)
Subjective:    Patient ID: Charles [name], male    DOB: 1960-12-16, 57 y.o.   MRN: [id number]  No chief complaint on file.   HPI:  Charles [name] is a 57 y.o. male who presents today for evaluation of and consideration for treatment of chronic hepatitis C.  Charles [name] has a significant history for chronic Hepatitis C with risk factors including . He most recently tested positive when evaluated by his PCP on 07/12/17. Lab work was reviewed and he was found to have a 1a genotype with a DNA level of 3,580,000 IU/ml. He has never sought treatment prior to today. An ultrasound of the abdomen was ordered, however it has yet to be completed to date. Denies a past history nor family history of liver disease. He does not currently have any associated symptoms. He has not had hepatitis A or B immunity or vaccination.    Immunization History  Administered Date(s) Administered  . Influenza,inj,Quad PF,6+ Mos 08/09/2017  . Tdap 07/12/2017     No Known Allergies    Outpatient Medications Prior to Visit  Medication Sig Dispense Refill  . albuterol (PROVENTIL HFA;VENTOLIN HFA) 108 (90 Base) MCG/ACT inhaler Inhale 2 puffs into the lungs every 4 (four) hours as needed for wheezing or shortness of breath (cough, shortness of breath or wheezing.). 1 Inhaler 3  . allopurinol (ZYLOPRIM) 100 MG tablet Take 100 mg by mouth daily.    . budesonide-formoterol (SYMBICORT) 80-4.5 MCG/ACT inhaler Inhale 2 puffs into the lungs 2 (two) times daily. 1 Inhaler 12  . Colchicine (MITIGARE) 0.6 MG CAPS Take by mouth.    . hydrochlorothiazide (HYDRODIURIL) 25 MG tablet Take 1 tablet (25 mg total) by mouth daily. 90 tablet 3  . losartan (COZAAR) 50 MG tablet Take 1 tablet (50 mg total) by mouth daily. 90 tablet 3  . meloxicam (MOBIC) 15 MG tablet Take 1 tablet (15 mg total) by mouth daily. 30 tablet 0  . tamsulosin (FLOMAX) 0.4 MG CAPS capsule Take 0.4 mg by mouth.     No facility-administered medications prior to visit.       Past Medical History:  Diagnosis Date  . Sickle cell anemia (HCC)       No past surgical history on file.    Family History  Problem Relation Age of Onset  . Hypertension Mother       Social History   Socioeconomic History  . Marital status: Single    Spouse name: Not on file  . Number of children: Not on file  . Years of education: Not on file  . Highest education level: Not on file  Social Needs  . Financial resource strain: Not on file  . Food insecurity - worry: Not on file  . Food insecurity - inability: Not on file  . Transportation needs - medical: Not on file  . Transportation needs - non-medical: Not on file  Occupational History  . Not on file  Tobacco Use  . Smoking status: Current Every Day Smoker  . Smokeless tobacco: Never Used  Substance and Sexual Activity  . Alcohol use: No  . Drug use: No  . Sexual activity: Not on file  Other Topics Concern  . Not on file  Social History Narrative  . Not on file      Review of Systems     Objective:    There were no vitals taken for this visit. Nursing note and vital signs reviewed.  Physical Exam  Assessment & Plan:   Problem List Items Addressed This Visit    None       I am having Charles [name] maintain his budesonide-formoterol, albuterol, Colchicine, allopurinol, tamsulosin, meloxicam, hydrochlorothiazide, and losartan.   No orders of the defined types were placed in this encounter.    Follow-up: No Follow-up on file.  Jeanine Luz, FNP [facility]

## 2017-11-26 ENCOUNTER — Encounter: Payer: Medicaid Other | Admitting: Family

## 2017-12-04 ENCOUNTER — Other Ambulatory Visit (HOSPITAL_COMMUNITY)
Admission: RE | Admit: 2017-12-04 | Discharge: 2017-12-04 | Disposition: A | Discharge status: Home / Self Care | Payer: Medicaid Other | Source: Ambulatory Visit | Attending: Infectious Diseases | Admitting: Infectious Diseases

## 2017-12-04 ENCOUNTER — Ambulatory Visit (INDEPENDENT_AMBULATORY_CARE_PROVIDER_SITE_OTHER): Payer: Medicaid Other | Admitting: Infectious Diseases

## 2017-12-04 ENCOUNTER — Encounter: Payer: Self-pay | Admitting: Infectious Diseases

## 2017-12-04 VITALS — BP 105/103 | Wt 132.0 lb

## 2017-12-04 DIAGNOSIS — I509 Heart failure, unspecified: Secondary | ICD-10-CM | POA: Insufficient documentation

## 2017-12-04 DIAGNOSIS — B182 Chronic viral hepatitis C: Secondary | ICD-10-CM | POA: Insufficient documentation

## 2017-12-04 DIAGNOSIS — Z113 Encounter for screening for infections with a predominantly sexual mode of transmission: Secondary | ICD-10-CM

## 2017-12-04 DIAGNOSIS — M8700 Idiopathic aseptic necrosis of unspecified bone: Secondary | ICD-10-CM | POA: Diagnosis not present

## 2017-12-04 NOTE — Assessment & Plan Note (Signed)
Need to set him up with CV (has name of Dr Herbie BaltimoreHarding)

## 2017-12-04 NOTE — Progress Notes (Signed)
HPI: Charles [name] is a 57 y.o. male who is here for his initial hep C visit with Dr Ninetta LightsHatcher.   Lab Results  Component Value Date   HCVGENOTYPE 1a 07/12/2017    Allergies: No Known Allergies  Vitals: BP: 105/103 (02/27 0948)  Past Medical History: Past Medical History:  Diagnosis Date  . 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 Hepatitis C virus carrier state (HCC)   . Hypertension   . Osteochondrosis   . Sickle cell anemia (HCC)     Social History: Social History   Socioeconomic History  . Marital status: Single    Spouse name: None  . Number of children: None  . Years of education: None  . Highest education level: None  Social Needs  . Financial resource strain: None  . Food insecurity - worry: None  . Food insecurity - inability: None  . Transportation needs - medical: None  . Transportation needs - non-medical: None  Occupational History  . None  Tobacco Use  . Smoking status: Current Every Day Smoker    Packs/day: 0.20  . Smokeless tobacco: Never Used  Substance and Sexual Activity  . Alcohol use: Yes    Alcohol/week: 4.2 oz    Types: 7 Cans of beer per week  . Drug use: No  . Sexual activity: No  Other Topics Concern  . None  Social History Narrative  . None    Labs: No results found for: HIV1RNAQUANT, HIV1RNAVL, CD4TABS, HEPBSAB, HEPBSAG, HCVAB  Lab Results  Component Value Date   HCVGENOTYPE 1a 07/12/2017    No flowsheet data found.  AST  Date Value  11/11/2017 30 IU/L  09/06/2017 53 U/L (H)  07/12/2017 170 U/L (H)   ALT  Date Value  11/11/2017 25 IU/L  09/06/2017 54 U/L  07/12/2017 103 U/L (H)    CrCl: CrCl cannot be calculated (Patient's most recent lab result is older than the maximum 21 days allowed.).  Fibrosis Score: Pending  Child-Pugh Score: Pending  Previous Treatment Regimen: None  Assessment: Charles [name] was diagnosis with hep C around Oct of last year. He has 1a virus. They did do a baseline NS5A and  he is positive for resistance. He admitted to getting this through snorting crack. He has not used for a while and is attending the [employer]. He has active Medicaid. Explained to him that there is a very good chance for a cure but he could get re-infected if he was to use again. He signed the readiness form today. We are going to get all labs today including the ultrasound. Once the results are back, we will submit based on the score. Likely will be [place]. Counseled him on adherence and side effects.   Recommendations:  Baseline labs and US before [facility]  Minh Pham, Pharm.D., BCPS, AAHIVP Clinical Infectious Disease Pharmacist [facility] 12/04/2017, 10:38 AM

## 2017-12-04 NOTE — Assessment & Plan Note (Signed)
Will send him to ortho. He has disability from this.
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
## 2017-12-04 NOTE — Assessment & Plan Note (Addendum)
Will check ultrasound Will check fiborsure Will check Hep A/B Will check HIV Discussed case with pharmacy, will start rx after staging labs.  rtc in 1 month

## 2017-12-05 LAB — RPR: RPR: REACTIVE — AB

## 2017-12-05 LAB — HEPATITIS B SURFACE ANTIGEN: HEP B S AG: NONREACTIVE

## 2017-12-05 LAB — PROTIME-INR
INR: 1
Prothrombin Time: 10.2 s (ref 9.0–11.5)

## 2017-12-05 LAB — RPR TITER

## 2017-12-05 LAB — HIV ANTIBODY (ROUTINE TESTING W REFLEX): HIV 1&2 Ab, 4th Generation: NONREACTIVE

## 2017-12-05 LAB — URINE CYTOLOGY ANCILLARY ONLY
CHLAMYDIA, DNA PROBE: NEGATIVE
Neisseria Gonorrhea: NEGATIVE

## 2017-12-05 LAB — FLUORESCENT TREPONEMAL AB(FTA)-IGG-BLD: Fluorescent Treponemal ABS: REACTIVE — AB

## 2017-12-05 LAB — HEPATITIS B SURFACE ANTIBODY,QUALITATIVE: Hep B S Ab: NONREACTIVE

## 2017-12-05 LAB — HEPATITIS A ANTIBODY, TOTAL: HEPATITIS A AB,TOTAL: REACTIVE — AB

## 2017-12-07 LAB — LIVER FIBROSIS, FIBROTEST-ACTITEST
ALT: 18 U/L (ref 9–46)
Alpha-2-Macroglobulin: 367 mg/dL — ABNORMAL HIGH (ref 106–279)
Apolipoprotein A1: 184 mg/dL — ABNORMAL HIGH (ref 94–176)
Bilirubin: 0.2 mg/dL (ref 0.2–1.2)
Fibrosis Score: 0.37
GGT: 66 U/L (ref 3–85)
Haptoglobin: 105 mg/dL (ref 43–212)
Necroinflammat ACT Score: 0.08
Reference ID: 2355195

## 2017-12-13 ENCOUNTER — Ambulatory Visit
Admission: RE | Admit: 2017-12-13 | Discharge: 2017-12-13 | Disposition: A | Discharge status: Home / Self Care | Payer: Medicaid Other | Source: Ambulatory Visit | Attending: Infectious Diseases | Admitting: Infectious Diseases

## 2017-12-13 DIAGNOSIS — B182 Chronic viral hepatitis C: Secondary | ICD-10-CM

## 2017-12-17 ENCOUNTER — Encounter (INDEPENDENT_AMBULATORY_CARE_PROVIDER_SITE_OTHER): Payer: Self-pay | Admitting: Orthopaedic Surgery

## 2017-12-17 ENCOUNTER — Ambulatory Visit (INDEPENDENT_AMBULATORY_CARE_PROVIDER_SITE_OTHER): Payer: Medicaid Other | Admitting: Orthopaedic Surgery

## 2017-12-17 DIAGNOSIS — M1711 Unilateral primary osteoarthritis, right knee: Secondary | ICD-10-CM

## 2017-12-17 DIAGNOSIS — M87052 Idiopathic aseptic necrosis of left femur: Secondary | ICD-10-CM

## 2017-12-17 NOTE — Progress Notes (Signed)
Office Visit Note   Patient: Charles [name]           Date of Birth: 07-27-1961           MRN: [id number] Visit Date: 12/17/2017              Requested by: Ginnie SmartHatcher, Jeffrey C, MD [address] [place], [place] [address] PCP: No primary care provider on file.   Assessment & Plan: Visit Diagnoses:  1. Unilateral primary osteoarthritis, right knee   2. Avascular necrosis of hip, left (HCC)     Plan: Impression is #1 left hip avascular necrosis with collapse of femoral head and secondary degenerative joint disease and #2 right knee degenerative joint disease.  At this point patient has failed treatment.  His left hip especially is beyond conservative treatment as he has a collapsed femoral head.  We discussed beginning with a left total hip replacement after a thorough discussion of risk benefits alternatives to surgery including infection, dislocation, leg length discrepancy, DVT which he denies a history of.  Patient does have a history of crack cocaine use but he states that he is no longer using this.  He does have hepatitis C which he is seeing Dr. Ninetta LightsHatcher for.  He has a history of congestive heart failure.  We will get preoperative medical and cardiac clearance prior to scheduling surgery for his left total hip replacement.  We will also plan on getting a urine drug screen preoperatively.  He does endorse marijuana use.  Follow-Up Instructions: Return if symptoms worsen or fail to improve.   Orders:  No orders of the defined types were placed in this encounter.  No orders of the defined types were placed in this encounter.     Procedures: No procedures performed   Clinical Data: No additional findings.   Subjective: Chief Complaint  Patient presents with  . Right Knee - Pain  . Left Hip - Pain    Probable is a 57 year old gentleman who comes in with chronic severe left hip pain and right knee pain.  He has a questionable diagnosis of sickle cell disease.  He does  endorse previous history of heavy alcohol abuse.  He has had stable housing since November 2018.  He endorses constant pain in his left hip and right knee that is worse with activity.  It causes him to give out and occasionally fall.  He does also have left groin pain.  Denies any radicular symptoms.  Denies any back pain.    Review of Systems  Constitutional: Negative.   All other systems reviewed and are negative.    Objective: Vital Signs: There were no vitals taken for this visit.  Physical Exam  Constitutional: He is oriented to person, place, and time. He appears well-developed and well-nourished.  HENT:  Head: Normocephalic and atraumatic.  Eyes: Pupils are equal, round, and reactive to light.  Neck: Neck supple.  Pulmonary/Chest: Effort normal.  Abdominal: Soft.  Musculoskeletal: Normal range of motion.  Neurological: He is alert and oriented to person, place, and time.  Skin: Skin is warm.  Psychiatric: He has a normal mood and affect. His behavior is normal. Judgment and thought content normal.  Nursing note and vitals reviewed.   Ortho Exam Left hip exam shows very painful rotation with minimal internal or external rotation.  Positive Stinchfield sign.  Left leg is slightly shorter.  Right knee exam shows no joint effusion.  Pain with range of motion.  Collaterals and  cruciates are stable.  Minimal patellofemoral crepitus. Specialty Comments:  No specialty comments available.  Imaging: No results found.   PMFS History: Patient Active Problem List   Diagnosis Date Noted  . Chronic viral hepatitis C (HCC) 12/04/2017  . AVN (avascular necrosis of bone) (HCC) 12/04/2017  . Essential hypertension 07/20/2017  . CHF (congestive heart failure) (HCC) 07/20/2017  . Chronic obstructive pulmonary disease (HCC) 07/20/2017   Past Medical History:  Diagnosis Date  . 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 Hepatitis C virus carrier state (HCC)   .  Hypertension   . Osteochondrosis   . Sickle cell anemia (HCC)     Family History  Problem Relation Age of Onset  . Hypertension Mother   . Hypertension Sister        twin  . Seizures Brother   . Schizophrenia Sister     History reviewed. No pertinent surgical history. Social History   Occupational History  . Not on file  Tobacco Use  . Smoking status: Current Every Day Smoker    Packs/day: 0.20  . Smokeless tobacco: Never Used  Substance and Sexual Activity  . Alcohol use: Yes    Alcohol/week: 4.2 oz    Types: 7 Cans of beer per week  . Drug use: No  . Sexual activity: No

## 2017-12-18 ENCOUNTER — Encounter: Payer: Self-pay | Admitting: Infectious Diseases

## 2017-12-18 ENCOUNTER — Telehealth: Payer: Self-pay

## 2017-12-18 NOTE — Telephone Encounter (Signed)
   [facility] Pre-operative Risk Assessment    Request for surgical clearance:  1. What type of surgery is being performed? Hip Arthroplasty   2. When is this surgery scheduled? N/A   3. What type of clearance is required (medical clearance vs. Pharmacy clearance to hold med vs. Both)? Medical  4. Are there any medications that need to be held prior to surgery and how long? N/A  5. Practice name and name of physician performing surgery? Dr. Eduard Roux   6. What is your office phone and fax number? [phone]  Fax [phone]   7. Anesthesia type (None, local, MAC, general) ? Unknown   Charles [name] T 12/18/2017, 2:26 PM  _________________________________________________________________   (provider comments below)

## 2017-12-18 NOTE — Progress Notes (Signed)
Letter written for clearance for L hip arthroplasty.

## 2017-12-19 NOTE — Telephone Encounter (Signed)
   Primary Cardiologist: Not assigned  Chart reviewed as part of pre-operative protocol coverage. Pt has not been seen by our practice. He will need a new pt appointment for preop evaluation for clearance. I will route to call back pool for scheduling.      Robbie LisBrittainy Arcelia Pals, PA-C  12/19/2017, 3:06 PM

## 2017-12-19 NOTE — Telephone Encounter (Signed)
Patient has already been scheduled for a new patient appt with Chelsea AusVin Bhagat, PA on 01/09/18 @ 10am.

## 2017-12-23 ENCOUNTER — Other Ambulatory Visit: Payer: Self-pay | Admitting: Family Medicine

## 2017-12-23 DIAGNOSIS — R9431 Abnormal electrocardiogram [ECG] [EKG]: Secondary | ICD-10-CM

## 2017-12-23 DIAGNOSIS — I509 Heart failure, unspecified: Secondary | ICD-10-CM

## 2017-12-23 NOTE — Progress Notes (Signed)
Fax medical clearance form to Dr. Roda ShuttersXu for left hip arthroplasty. Referring to cardiology for cardiac clearance

## 2018-01-01 ENCOUNTER — Encounter: Payer: Self-pay | Admitting: Infectious Diseases

## 2018-01-01 ENCOUNTER — Ambulatory Visit (INDEPENDENT_AMBULATORY_CARE_PROVIDER_SITE_OTHER): Payer: Medicaid Other | Admitting: Infectious Diseases

## 2018-01-01 DIAGNOSIS — M8700 Idiopathic aseptic necrosis of unspecified bone: Secondary | ICD-10-CM

## 2018-01-01 DIAGNOSIS — B182 Chronic viral hepatitis C: Secondary | ICD-10-CM

## 2018-01-01 DIAGNOSIS — H524 Presbyopia: Secondary | ICD-10-CM | POA: Insufficient documentation

## 2018-01-01 DIAGNOSIS — A539 Syphilis, unspecified: Secondary | ICD-10-CM

## 2018-01-01 MED ORDER — GLECAPREVIR-PIBRENTASVIR 100-40 MG PO TABS: 3.0000 | ORAL_TABLET | Freq: Every day | ORAL | 1 refills | Status: DC

## 2018-01-01 NOTE — Assessment & Plan Note (Signed)
[facility]  1:8 at that time.  No further tx needed at this point.

## 2018-01-01 NOTE — Progress Notes (Signed)
HPI: Charles [name] is a 56 y.o. male who is here to f/u with [place] for his hep C.  Lab Results  Component Value Date   HCVGENOTYPE 1a 07/12/2017    Allergies: No Known Allergies  Vitals: Temp: 97.9 F (36.6 C) (03/27 1126) Temp Source: Oral (03/27 1126) BP: 116/77 (03/27 1126) Pulse Rate: 92 (03/27 1126)  Past Medical History: Past Medical History:  Diagnosis Date  . 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 Hepatitis C virus carrier state (HCC)   . Hypertension   . Osteochondrosis   . Sickle cell anemia (HCC)     Social History: Social History   Socioeconomic History  . Marital status: Single    Spouse name: Not on file  . Number of children: Not on file  . Years of education: Not on file  . Highest education level: Not on file  Occupational History  . Not on file  Social Needs  . Financial resource strain: Not on file  . Food insecurity:    Worry: Not on file    Inability: Not on file  . Transportation needs:    Medical: Not on file    Non-medical: Not on file  Tobacco Use  . Smoking status: Current Every Day Smoker    Packs/day: 0.20  . Smokeless tobacco: Never Used  Substance and Sexual Activity  . Alcohol use: Yes    Alcohol/week: 4.2 oz    Types: 7 Cans of beer per week  . Drug use: No  . Sexual activity: Never  Lifestyle  . Physical activity:    Days per week: Not on file    Minutes per session: Not on file  . Stress: Not on file  Relationships  . Social connections:    Talks on phone: Not on file    Gets together: Not on file    Attends religious service: Not on file    Active member of club or organization: Not on file    Attends meetings of clubs or organizations: Not on file    Relationship status: Not on file  Other Topics Concern  . Not on file  Social History Narrative  . Not on file    Labs: Hep B S Ab (no units)  Date Value  12/04/2017 NON-REACTIVE   Hepatitis B Surface Ag (no units)  Date Value  12/04/2017  NON-REACTIVE    Lab Results  Component Value Date   HCVGENOTYPE 1a 07/12/2017    No flowsheet data found.  AST  Date Value  11/11/2017 30 IU/L  09/06/2017 53 U/L (H)  07/12/2017 170 U/L (H)   ALT  Date Value  12/04/2017 18 U/L  11/11/2017 25 IU/L  09/06/2017 54 U/L  07/12/2017 103 U/L (H)   INR (no units)  Date Value  12/04/2017 1.0    CrCl: CrCl cannot be calculated (Patient's most recent lab result is older than the maximum 21 days allowed.).  Fibrosis Score: F2/3 as assessed by ARFI  Child-Pugh Score: Class A  Previous Treatment Regimen: None  Assessment: Charles [name] saw [place] a month ago for his hep C. All of his labs are back now. His fibrosis score came back as F2/3. Therefore, we will use Mavyret x 8 wks through [facility]. Counseled him on the importance of adherence. He would like for it to be shipped to his treatment center with the address below. Once submitted, he should be approved very quickly. We will bring him back two week post  initiation for monitoring. We will see him for the cure visit.   Recommendations:  Mavyret 3 tabs qday with food x 8 wks [facility] F/u after starting therapy  Charles [name], Pharm.D., BCPS, AAHIVP Clinical Infectious Disease Pharmacist [facility] 01/01/2018, 12:01 PM  [facility] [address] [place] [phone]

## 2018-01-01 NOTE — Assessment & Plan Note (Signed)
Appreciate ortho f/u.  Getting CV clearance next week.

## 2018-01-01 NOTE — Assessment & Plan Note (Signed)
He has never seen ophtho before.  Will get him appt.

## 2018-01-01 NOTE — Assessment & Plan Note (Signed)
He is meeting with pharm today.  Start meds soon.  rtc with pharm in ~ 6 weeks.

## 2018-01-01 NOTE — Progress Notes (Signed)
   Subjective:    Patient ID: Charles [name], male    DOB: [birth year], 59 y.o.   MRN: [id number]  HPI 57 yo M with Hep C (1a VL 3.58 million), previous crack use, AVN of L hip.  He was seen in ID clinic on 12-04-17. He was also eval by pharm and was planned to be started on mavyret.  He has had f/u with ortho since that time. He is awaiting further eval and scheduling. He has new CV appt pending (hx of CHF).  His u/s was F1/2.  He had a RPR+ 1.1. States he was treated in early 7180s. Got 1 shot for this.  States he was unable to sleep last night due to hip and knee pain.   Review of Systems  Constitutional: Negative for appetite change and unexpected weight change.  Eyes: Positive for visual disturbance.  Respiratory: Negative for shortness of breath and stridor.   Gastrointestinal: Negative for abdominal pain, constipation and diarrhea.  Genitourinary: Positive for difficulty urinating.  Hematological: Does not bruise/bleed easily.  Psychiatric/Behavioral: Positive for sleep disturbance.  urinary hesitancy and then post-void drip.  Please see HPI. All other systems reviewed and negative.     Objective:   Physical Exam  Constitutional: He appears well-developed and well-nourished.  HENT:  Mouth/Throat: No oropharyngeal exudate.  Eyes: Pupils are equal, round, and reactive to light. EOM are normal.  Neck: Neck supple.  Cardiovascular: Normal rate, regular rhythm and normal heart sounds.  Pulmonary/Chest: Effort normal and breath sounds normal.  Abdominal: Soft. Bowel sounds are normal. There is no tenderness. There is no rebound.  Musculoskeletal: He exhibits no edema.  Lymphadenopathy:    He has no cervical adenopathy.          Assessment & Plan:

## 2018-01-02 ENCOUNTER — Encounter: Payer: Self-pay | Admitting: Pharmacy Technician

## 2018-01-02 MED FILL — MAVYRET 100-40 MG TABS: 100-40 | 28 days supply | Qty: 84 | Fill #0

## 2018-01-09 ENCOUNTER — Ambulatory Visit (INDEPENDENT_AMBULATORY_CARE_PROVIDER_SITE_OTHER): Payer: Medicaid Other | Admitting: Physician Assistant

## 2018-01-09 ENCOUNTER — Encounter: Payer: Self-pay | Admitting: Physician Assistant

## 2018-01-09 VITALS — BP 118/78 | HR 87 | Ht 67.0 in | Wt 126.4 lb

## 2018-01-09 DIAGNOSIS — Z720 Tobacco use: Secondary | ICD-10-CM | POA: Diagnosis not present

## 2018-01-09 DIAGNOSIS — I119 Hypertensive heart disease without heart failure: Secondary | ICD-10-CM

## 2018-01-09 DIAGNOSIS — R0600 Dyspnea, unspecified: Secondary | ICD-10-CM

## 2018-01-09 DIAGNOSIS — Z01818 Encounter for other preprocedural examination: Secondary | ICD-10-CM | POA: Diagnosis not present

## 2018-01-09 DIAGNOSIS — R002 Palpitations: Secondary | ICD-10-CM

## 2018-01-09 LAB — PRO B NATRIURETIC PEPTIDE: NT-Pro BNP: 216 pg/mL — ABNORMAL HIGH (ref 0–210)

## 2018-01-09 NOTE — Patient Instructions (Addendum)
Medication Instructions:  Your physician recommends that you continue on your current medications as directed. Please refer to the Current Medication list given to you today.   Labwork: Lab work to be done today--BNP  Testing/Procedures: Your physician has requested that you have an echocardiogram. Echocardiography is a painless test that uses sound waves to create images of your heart. It provides your doctor with information about the size and shape of your heart and how well your heart's chambers and valves are working. This procedure takes approximately one hour. There are no restrictions for this procedure.    Follow-Up: Follow up as needed (based on echocardiogram results)    Any Other Special Instructions Will Be Listed Below (If Applicable).     If you need a refill on your cardiac medications before your next appointment, please call your pharmacy.

## 2018-01-09 NOTE — Progress Notes (Addendum)
The patient has been seen in conjunction with Vin Baghat, PAC. All aspects of care have been considered and discussed. The patient has been personally interviewed, examined, and all clinical data has been reviewed.   The patient is cleared for the upcoming surgical procedure.  We have ordered an echocardiogram to document systolic function in the setting of EKG showing significant voltage compatible with LVH.  Rule out diastolic heart failure.  Cardiology Office Note    Date:  01/09/2018   ID:  Charles [name], DOB [birth year], MRN [id number]  PCP:  Bing NeighborsHarris, Kimberly S, FNP  Cardiologist: New to Dr.Smith   Chief Complaint:  Cardiac clearance  History of Present Illness:   Charles AbuCarl Federici is a 57 y.o. male with hx of HTN, COPD with ongoing tobacco smoking,  sickle cell anemia, hep C and AVN of L hip refereed by Dr. Glee ArvinMichael Xu for cardiac clearance for left hip arthroplasty.   No prior cardiac hx. No family hx of CAD or MI. MGF with hx of CHF. 40 years of tobacco smoking, trying to quit. Drinks120 oz of liquor few days/week. No illicit drug use.   Patient does limited ambulation due to chronic left hip pain and right knee pain.  He has intermittent shortness of breath mostly after smoking that resolved with use of as needed albuterol.  He complains of intermittent "fluttering sensation" without any associated symptoms.  Resolves within a few minutes.  Might be exacerbated after heavy drinking.  He denies syncope, dizziness, orthopnea, PND, lower extremity edema or melena.  Past Medical History:  Diagnosis Date  . 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 COPD (chronic obstructive pulmonary disease) (HCC)   . Gout   . Hepatitis C virus carrier state (HCC)   . Hypertension   . Osteochondrosis   . Sickle cell anemia (HCC)     No past surgical history on file.  Current Medications: Prior to Admission medications   Medication Sig Start Date End Date Taking? Authorizing Provider   albuterol (PROVENTIL HFA;VENTOLIN HFA) 108 (90 Base) MCG/ACT inhaler Inhale 2 puffs into the lungs every 4 (four) hours as needed for wheezing or shortness of breath (cough, shortness of breath or wheezing.). 08/09/17   Bing NeighborsHarris, Kimberly S, FNP  allopurinol (ZYLOPRIM) 100 MG tablet Take 100 mg by mouth daily.    [provider]  budesonide-formoterol (SYMBICORT) 80-4.5 MCG/ACT inhaler Inhale 2 puffs into the lungs 2 (two) times daily. 07/16/17   Bing NeighborsHarris, Kimberly S, FNP  Colchicine (MITIGARE) 0.6 MG CAPS Take by mouth.    [provider]  Glecaprevir-Pibrentasvir (MAVYRET) 100-40 MG TABS Take 3 tablets by mouth daily with supper. 01/01/18   Ginnie SmartHatcher, Jeffrey C, MD  hydrochlorothiazide (HYDRODIURIL) 25 MG tablet Take 1 tablet (25 mg total) by mouth daily. 11/11/17   Bing NeighborsHarris, Kimberly S, FNP  HYDROcodone-acetaminophen (NORCO/VICODIN) 5-325 MG tablet Take 1 tablet by mouth every 6 (six) hours as needed for moderate pain.    [provider]  losartan (COZAAR) 50 MG tablet Take 1 tablet (50 mg total) by mouth daily. 11/11/17   Bing NeighborsHarris, Kimberly S, FNP  meloxicam (MOBIC) 15 MG tablet Take 1 tablet (15 mg total) by mouth daily. Patient not taking: Reported on 01/01/2018 11/11/17   Bing NeighborsHarris, Kimberly S, FNP  penicillin v potassium (VEETID) 500 MG tablet Take 500 mg by mouth 4 (four) times daily.    [provider]  tamsulosin (FLOMAX) 0.4 MG CAPS capsule Take 0.4 mg by mouth.  [provider]    Allergies:   Patient has no known allergies.   Social History   Socioeconomic History  . Marital status: Single    Spouse name: Not on file  . Number of children: Not on file  . Years of education: Not on file  . Highest education level: Not on file  Occupational History  . Not on file  Social Needs  . Financial resource strain: Not on file  . Food insecurity:    Worry: Not on file    Inability: Not on file  . Transportation needs:    Medical: Not on file     Non-medical: Not on file  Tobacco Use  . Smoking status: Current Every Day Smoker    Packs/day: 0.20  . Smokeless tobacco: Never Used  Substance and Sexual Activity  . Alcohol use: Yes    Alcohol/week: 4.2 oz    Types: 7 Cans of beer per week  . Drug use: No  . Sexual activity: Never  Lifestyle  . Physical activity:    Days per week: Not on file    Minutes per session: Not on file  . Stress: Not on file  Relationships  . Social connections:    Talks on phone: Not on file    Gets together: Not on file    Attends religious service: Not on file    Active member of club or organization: Not on file    Attends meetings of clubs or organizations: Not on file    Relationship status: Not on file  Other Topics Concern  . Not on file  Social History Narrative  . Not on file     Family History:  The patient's family history includes Hypertension in his mother and sister; Schizophrenia in his sister; Seizures in his brother.   ROS:   Please see the history of present illness.    ROS All other systems reviewed and are negative.   PHYSICAL EXAM:   VS:  BP 118/78 (BP Location: Right Arm)   Pulse 87   Ht 5\' 7"  (1.702 m)   Wt 126 lb 6.4 oz (57.3 kg)   SpO2 99%   BMI 19.80 kg/m    GEN: Thin frail male in no acute distress  HEENT: normal  Neck: no JVD, carotid bruits, or masses Cardiac: RRR; no murmurs, rubs, or gallops,no edema  Respiratory:  clear to auscultation bilaterally, normal work of breathing GI: soft, nontender, nondistended, + BS MS: no deformity or atrophy  Skin: warm and dry, no rash Neuro:  Alert and Oriented x 3, Strength and sensation are intact Psych: euthymic mood, full affect  Wt Readings from Last 3 Encounters:  01/09/18 126 lb 6.4 oz (57.3 kg)  01/01/18 135 lb (61.2 kg)  12/04/17 132 lb (59.9 kg)      Studies/Labs Reviewed:   EKG:  EKG is ordered today.  The ekg ordered today demonstrates normal sinus rhythm at rate of 87 bpm with LVH  criteria  Recent Labs: 07/12/2017: TSH 1.25 09/06/2017: B Natriuretic Peptide 43.9 11/11/2017: BUN 8; Creatinine, Ser 0.78; Hemoglobin 13.5; Platelets 270; Potassium 3.6; Sodium 138 12/04/2017: ALT 18   Lipid Panel    Component Value Date/Time   CHOL 153 07/12/2017 1206   TRIG 88 07/12/2017 1206   HDL 104 07/12/2017 1206   CHOLHDL 1.5 07/12/2017 1206   LDLCALC 32 07/12/2017 1206    Additional studies/ records that were reviewed today include:   As above   ASSESSMENT &  PLAN:    1. Dyspnea -Most likely related to his ongoing tobacco smoking.  He has LVH by EKG.  Will get BNP and echocardiogram to rule out heart failure.  Continue hydrochlorothiazide and albuterol.  2.  Preop cardiovascular clearance -Unable to determine functional status due to limitation from chronic left hip and right knee pain.  Will get echocardiogram prior to clearance.  3.  Tobacco and alcohol abuse -Advised complete cessation.  Education given.  4.  Hypertension -Blood pressure well controlled on current medication  5.  Palpitation -Seems like related to excess alcohol intake. No associated symptoms.  We will continue to follow.  Does not warrants monitor currently.    Medication Adjustments/Labs and Tests Ordered: Current medicines are reviewed at length with the patient today.  Concerns regarding medicines are outlined above.  Medication changes, Labs and Tests ordered today are listed in the Patient Instructions below. Patient Instructions  Medication Instructions:  Your physician recommends that you continue on your current medications as directed. Please refer to the Current Medication list given to you today.   Labwork: Lab work to be done today--BNP  Testing/Procedures: Your physician has requested that you have an echocardiogram. Echocardiography is a painless test that uses sound waves to create images of your heart. It provides your doctor with information about the size and shape of  your heart and how well your heart's chambers and valves are working. This procedure takes approximately one hour. There are no restrictions for this procedure.    Follow-Up: Follow up as needed (based on echocardiogram results)    Any Other Special Instructions Will Be Listed Below (If Applicable).     If you need a refill on your cardiac medications before your next appointment, please call your pharmacy.      Lorelei Pont, [place]  01/09/2018 10:38 AM    [facility] [address] [place], [place], [place]  [address] Phone: [phone]; Fax: [phone]

## 2018-01-13 ENCOUNTER — Other Ambulatory Visit: Payer: Self-pay | Admitting: *Deleted

## 2018-01-13 MED ORDER — FUROSEMIDE 20 MG PO TABS: 20.0000 mg | ORAL_TABLET | Freq: Every day | ORAL | 0 refills | Status: DC

## 2018-01-16 ENCOUNTER — Other Ambulatory Visit: Payer: Self-pay

## 2018-01-16 ENCOUNTER — Ambulatory Visit (HOSPITAL_COMMUNITY): Payer: Medicaid Other | Attending: Cardiovascular Disease

## 2018-01-16 DIAGNOSIS — R0600 Dyspnea, unspecified: Secondary | ICD-10-CM

## 2018-01-16 DIAGNOSIS — Z01818 Encounter for other preprocedural examination: Secondary | ICD-10-CM | POA: Diagnosis not present

## 2018-01-16 DIAGNOSIS — F17200 Nicotine dependence, unspecified, uncomplicated: Secondary | ICD-10-CM | POA: Insufficient documentation

## 2018-01-16 DIAGNOSIS — I10 Essential (primary) hypertension: Secondary | ICD-10-CM | POA: Insufficient documentation

## 2018-01-16 DIAGNOSIS — J449 Chronic obstructive pulmonary disease, unspecified: Secondary | ICD-10-CM | POA: Diagnosis not present

## 2018-01-16 DIAGNOSIS — Z01810 Encounter for preprocedural cardiovascular examination: Secondary | ICD-10-CM | POA: Diagnosis not present

## 2018-01-23 ENCOUNTER — Ambulatory Visit (INDEPENDENT_AMBULATORY_CARE_PROVIDER_SITE_OTHER): Payer: Medicaid Other | Admitting: Pharmacist Clinician (PhC)/ Clinical Pharmacy Specialist

## 2018-01-23 DIAGNOSIS — B182 Chronic viral hepatitis C: Secondary | ICD-10-CM

## 2018-01-23 LAB — COMPLETE METABOLIC PANEL WITH GFR
AG RATIO: 1.1 (calc) (ref 1.0–2.5)
ALBUMIN MSPROF: 4 g/dL (ref 3.6–5.1)
ALT: 20 U/L (ref 9–46)
AST: 27 U/L (ref 10–35)
Alkaline phosphatase (APISO): 101 U/L (ref 40–115)
BILIRUBIN TOTAL: 0.7 mg/dL (ref 0.2–1.2)
BUN: 17 mg/dL (ref 7–25)
CHLORIDE: 99 mmol/L (ref 98–110)
CO2: 34 mmol/L — ABNORMAL HIGH (ref 20–32)
Calcium: 9.6 mg/dL (ref 8.6–10.3)
Creat: 1 mg/dL (ref 0.70–1.33)
GFR, EST AFRICAN AMERICAN: 96 mL/min/{1.73_m2} (ref >=60)
GFR, Est Non African American: 83 mL/min/{1.73_m2} (ref >=60)
Globulin: 3.7 g/dL (calc) (ref 1.9–3.7)
Glucose, Bld: 121 mg/dL — ABNORMAL HIGH (ref 65–99)
POTASSIUM: 4 mmol/L (ref 3.5–5.3)
Sodium: 139 mmol/L (ref 135–146)
TOTAL PROTEIN: 7.7 g/dL (ref 6.1–8.1)

## 2018-01-23 NOTE — Progress Notes (Addendum)
HPI: Charles [name] is a 57 y.o. male here today to follow up for Hep C treatment with Mavryet. He started treatment ~3/30.  Lab Results  Component Value Date   HCVGENOTYPE 1a 07/12/2017    Allergies: No Known Allergies  Vitals:    Past Medical History: Past Medical History:  Diagnosis Date  . 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 COPD (chronic obstructive pulmonary disease) (HCC)   . Gout   . Hepatitis C virus carrier state (HCC)   . Hypertension   . Osteochondrosis   . Sickle cell anemia (HCC)     Social History: Social History   Socioeconomic History  . Marital status: Single    Spouse name: Not on file  . Number of children: Not on file  . Years of education: Not on file  . Highest education level: Not on file  Occupational History  . Not on file  Social Needs  . Financial resource strain: Not on file  . Food insecurity:    Worry: Not on file    Inability: Not on file  . Transportation needs:    Medical: Not on file    Non-medical: Not on file  Tobacco Use  . Smoking status: Current Every Day Smoker    Packs/day: 0.20  . Smokeless tobacco: Never Used  Substance and Sexual Activity  . Alcohol use: Yes    Alcohol/week: 4.2 oz    Types: 7 Cans of beer per week  . Drug use: No  . Sexual activity: Never  Lifestyle  . Physical activity:    Days per week: Not on file    Minutes per session: Not on file  . Stress: Not on file  Relationships  . Social connections:    Talks on phone: Not on file    Gets together: Not on file    Attends religious service: Not on file    Active member of club or organization: Not on file    Attends meetings of clubs or organizations: Not on file    Relationship status: Not on file  Other Topics Concern  . Not on file  Social History Narrative  . Not on file    Labs: Hep B S Ab (no units)  Date Value  12/04/2017 NON-REACTIVE   Hepatitis B Surface Ag (no units)  Date Value  12/04/2017 NON-REACTIVE     Lab Results  Component Value Date   HCVGENOTYPE 1a 07/12/2017    No flowsheet data found.  AST  Date Value  11/11/2017 30 IU/L  09/06/2017 53 U/L (H)  07/12/2017 170 U/L (H)   ALT  Date Value  12/04/2017 18 U/L  11/11/2017 25 IU/L  09/06/2017 54 U/L  07/12/2017 103 U/L (H)   INR (no units)  Date Value  12/04/2017 1.0    CrCl: CrCl cannot be calculated (Patient's most recent lab result is older than the maximum 21 days allowed.).  Fibrosis Score: F2/3 as assessed by ARFI   Child-Pugh Score: A  Previous Treatment Regimen: none  Assessment: [name] is here for his first follow up visit for Hep C after starting Mavryet at [facility] end [facility]. He reports 1 missed dose at the beginning of treatment, but otherwise is adherent. He did have some nausea at the beginning of treatment, and has some abdominal pain and dizziness occasionally. These are not unbearable side effects for him and he will continue Mavryet for total 8 weeks of treatment. He has  a little over 1 week's worth of doses left in his current box. He is on many other medications and recently started sumatriptan for migraines.   Recommendations: Continue Mavryet 3 tablets daily x total 8 weeks  Hep C viral load and CMP today F/u EOT visit, SVR12 labs and cure visit with pharmacy  Kathyrn Sheriff, PharmD PGY1 Pharmacy Resident [facility] 01/23/18 10:26 AM   Agreed with Lindsey's note. [facility] will send the second month of Mavyret to his treatment place.   Ulyses Southward, PharmD, BCPS, AAHIVP, CPP Infectious Disease Pharmacist Pager: [phone] 01/23/2018 10:42 AM

## 2018-01-27 ENCOUNTER — Encounter: Payer: Self-pay | Admitting: *Deleted

## 2018-01-27 LAB — HEPATITIS C RNA QUANTITATIVE
HCV Quantitative Log: <1.18 Log IU/mL
HCV RNA, PCR, QN: NOT DETECTED [IU]/mL

## 2018-02-10 ENCOUNTER — Ambulatory Visit (INDEPENDENT_AMBULATORY_CARE_PROVIDER_SITE_OTHER): Payer: Medicaid Other | Admitting: Family Medicine

## 2018-02-10 ENCOUNTER — Encounter: Payer: Self-pay | Admitting: Family Medicine

## 2018-02-10 VITALS — BP 132/84 | HR 88 | Temp 98.0°F | Resp 16 | Ht 67.0 in | Wt 126.0 lb

## 2018-02-10 DIAGNOSIS — R339 Retention of urine, unspecified: Secondary | ICD-10-CM | POA: Diagnosis not present

## 2018-02-10 DIAGNOSIS — M87052 Idiopathic aseptic necrosis of left femur: Secondary | ICD-10-CM | POA: Diagnosis not present

## 2018-02-10 DIAGNOSIS — I10 Essential (primary) hypertension: Secondary | ICD-10-CM | POA: Diagnosis not present

## 2018-02-10 LAB — POCT URINALYSIS DIP (MANUAL ENTRY)
BILIRUBIN UA: NEGATIVE
Blood, UA: NEGATIVE
GLUCOSE UA: NEGATIVE mg/dL
Ketones, POC UA: NEGATIVE mg/dL
LEUKOCYTES UA: NEGATIVE
NITRITE UA: NEGATIVE
PH UA: 6 (ref 5.0–8.0)
Protein Ur, POC: NEGATIVE mg/dL
Spec Grav, UA: 1.015 (ref 1.010–1.025)
Urobilinogen, UA: 0.2 E.U./dL

## 2018-02-10 NOTE — Patient Instructions (Signed)
Your blood pressure is at goal on current medication regimen.   Continue medication, monitor blood pressure at home. Continue DASH diet. Reminder to go to the ER if any CP, SOB, nausea, dizziness, severe HA, changes vision/speech, left arm numbness and tingling and jaw pain.    Your appointment at [facility] orthopedics is Friday, May 10 at 10 AM

## 2018-02-10 NOTE — Progress Notes (Signed)
Chief Complaint  Patient presents with  . Follow-up    3 month on chronic condition    Subjective:    Patient ID: Charles [name], male    DOB: 01/10/1961, 57 y.o.   MRN: [id number]  HPI A 57 year old male with a history of hypertension, a vascular necrosis, and urinary retention presents for follow-up of chronic conditions. Patient states that he has been taking all antihypertensive medications consistently.  He generally follows a low-salt diet.  He is not been very active due to right hip and knee pain.  Patient is followed by [facility] orthopedic services for a vascular necrosis.  He states that it was recommended that he undergo a right hip replacement.  He is not been scheduled for surgery.  Current pain intensity is 8 out of 10 characterized as constant and throbbing.  Patient states that right hip is aggravated when transitioning from sitting to standing, applying full weight, or any pivoting movements.  Patient is also complaining of urinary retention.  He has been consistently taking Flomax 0.4 mg daily.  He states that he has difficulty starting urine stream and feels as if he is not emptying his bladder completely..  Patient has not had digital rectal exam in greater than 1 year.  He had a PSA 7 months ago which was unremarkable. Past Medical History:  Diagnosis Date  . 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 COPD (chronic obstructive pulmonary disease) (HCC)   . Gout   . Hepatitis C virus carrier state (HCC)   . Hypertension   . Osteochondrosis   . Sickle cell anemia (HCC)    Social History   Socioeconomic History  . Marital status: Single    Spouse name: Not on file  . Number of children: Not on file  . Years of education: Not on file  . Highest education level: Not on file  Occupational History  . Not on file  Social Needs  . Financial resource strain: Not on file  . Food insecurity:    Worry: Not on file    Inability: Not on file  . Transportation  needs:    Medical: Not on file    Non-medical: Not on file  Tobacco Use  . Smoking status: Current Every Day Smoker    Packs/day: 0.20  . Smokeless tobacco: Never Used  Substance and Sexual Activity  . Alcohol use: Yes    Alcohol/week: 4.2 oz    Types: 7 Cans of beer per week  . Drug use: No  . Sexual activity: Never  Lifestyle  . Physical activity:    Days per week: Not on file    Minutes per session: Not on file  . Stress: Not on file  Relationships  . Social connections:    Talks on phone: Not on file    Gets together: Not on file    Attends religious service: Not on file    Active member of club or organization: Not on file    Attends meetings of clubs or organizations: Not on file    Relationship status: Not on file  . Intimate partner violence:    Fear of current or ex partner: Not on file    Emotionally abused: Not on file    Physically abused: Not on file    Forced sexual activity: Not on file  Other Topics Concern  . Not on file  Social History Narrative  . Not on file   Immunization History  Administered Date(s) Administered  . Influenza,inj,Quad PF,6+ Mos 08/09/2017  . Tdap 07/12/2017   No Known Allergies  Review of Systems  Cardiovascular: Negative.   Gastrointestinal: Negative.   Endocrine: Negative for polydipsia, polyphagia and polyuria.  Musculoskeletal: Positive for arthralgias.       Left hip pain  Skin: Negative.   Hematological: Negative.   Psychiatric/Behavioral: Negative.        Objective:   Physical Exam  Constitutional: He appears well-developed and well-nourished.  Eyes: Pupils are equal, round, and reactive to light.  Cardiovascular: Normal rate, regular rhythm, normal heart sounds and intact distal pulses.  Pulmonary/Chest: Effort normal and breath sounds normal.  Abdominal: Soft. Bowel sounds are normal.  Musculoskeletal:       Left hip: He exhibits decreased range of motion, decreased strength and swelling.  Neurological: He  is alert.  Skin: Skin is warm and dry.  Psychiatric: He has a normal mood and affect. His behavior is normal. Judgment and thought content normal.       BP 132/84 (BP Location: Left Arm, Patient Position: Sitting, Cuff Size: Small)   Pulse 88   Temp 98 F (36.7 C) (Oral)   Resp 16   Ht  (1.702 m)   Wt 126 lb (57.2 kg)   SpO2 100%   BMI 19.73 kg/m  Assessment & Plan:  1. Essential hypertension Blood pressure is at goal on current medication regimen No medication changes warranted Reviewed urinalysis no proteinuria present Reviewed previous renal functioning, unremarkable We have discussed target BP range and blood pressure goal. I have advised patient to check BP regularly and to call us back or report to clinic if the numbers are consistently higher than 140/90. We discussed the importance of compliance with medical therapy and DASH diet recommended, consequences of uncontrolled hypertension discussed.  - continue current BP medications  - POCT urinalysis dipstick  2. Urinary retention Continue tamsulosin 0.4 mg daily.  Warrants a referral to urology for further work-up and evaluation. - Ambulatory referral to Urology  3. Avascular necrosis of bone of hip, left [facility]) Call [facility] orthopedic specialist to schedule first available appointment for reevaluation of a vascular necrosis of left hip.  Patient is scheduled for Friday, May 10 at 10:15 AM with Dr. Imogene Burn  RTC: 3 months for chronic conditions   Lachina Rennis Petty  MSN, FNP-C [facility] [address] [place], [place] [address] [phone]

## 2018-02-14 ENCOUNTER — Ambulatory Visit (INDEPENDENT_AMBULATORY_CARE_PROVIDER_SITE_OTHER): Payer: Medicaid Other | Admitting: Orthopaedic Surgery

## 2018-02-18 ENCOUNTER — Ambulatory Visit (INDEPENDENT_AMBULATORY_CARE_PROVIDER_SITE_OTHER): Payer: Medicaid Other

## 2018-02-18 ENCOUNTER — Encounter (INDEPENDENT_AMBULATORY_CARE_PROVIDER_SITE_OTHER): Payer: Self-pay | Admitting: Orthopaedic Surgery

## 2018-02-18 ENCOUNTER — Ambulatory Visit (INDEPENDENT_AMBULATORY_CARE_PROVIDER_SITE_OTHER): Payer: Medicaid Other | Admitting: Orthopaedic Surgery

## 2018-02-18 DIAGNOSIS — M87052 Idiopathic aseptic necrosis of left femur: Secondary | ICD-10-CM

## 2018-02-18 NOTE — Progress Notes (Signed)
Office Visit Note   Patient: Charles [name]           Date of Birth: 04-16-1961           MRN: [id number] Visit Date: 02/18/2018              Requested by: Bing Neighbors, FNP [address] [place], [place] [address] PCP: Bing Neighbors, FNP   Assessment & Plan: Visit Diagnoses:  1. Avascular necrosis of hip, left (HCC)     Plan: Impression is severe collapse of left femoral head secondary to avascular necrosis due to his sickle cell disease.  At this point patient has been medically optimized and ready to proceed with scheduling for his left total hip replacement.  He is aware the risks and benefits related to surgery.  He wishes to have this scheduled as soon as possible.  He may require a brief stay in a skilled nursing facility postoperatively.  Denies any history of DVT.  Follow-Up Instructions: Return if symptoms worsen or fail to improve.   Orders:  Orders Placed This Encounter  Procedures  . XR HIP UNILAT W OR W/O PELVIS 2-3 VIEWS LEFT   No orders of the defined types were placed in this encounter.     Procedures: No procedures performed   Clinical Data: No additional findings.   Subjective: Chief Complaint  Patient presents with  . Left Hip - Pain    [name] follows up for continued left hip avascular necrosis.  He has seen infectious disease, cardiology, PCP has gotten the necessary preoperative clearance.  His hepatitis C viral load is now undetectable.  He has been compliant with his medicines.  He wishes to have hip replacement surgery scheduled ASAP.  He is in significant pain and has limited ability to ambulate any significant distances.  He has severe difficulty with ADLs.   Review of Systems  Constitutional: Negative.   All other systems reviewed and are negative.    Objective: Vital Signs: There were no vitals taken for this visit.  Physical Exam  Constitutional: He is oriented to person, place, and time. He appears well-developed and  well-nourished.  HENT:  Head: Normocephalic and atraumatic.  Eyes: Pupils are equal, round, and reactive to light.  Neck: Neck supple.  Pulmonary/Chest: Effort normal.  Abdominal: Soft.  Musculoskeletal: Normal range of motion.  Neurological: He is alert and oriented to person, place, and time.  Skin: Skin is warm.  Psychiatric: He has a normal mood and affect. His behavior is normal. Judgment and thought content normal.  Nursing note and vitals reviewed.   Ortho Exam Left hip exam shows positive FADIR.  Positive Stinchfield sign. Specialty Comments:  No specialty comments available.  Imaging: No results found.   PMFS History: Patient Active Problem List   Diagnosis Date Noted  . Syphilis 01/01/2018  . Presbyopia 01/01/2018  . Chronic viral hepatitis C (HCC) 12/04/2017  . AVN (avascular necrosis of bone) (HCC) 12/04/2017  . Essential hypertension 07/20/2017  . CHF (congestive heart failure) (HCC) 07/20/2017  . Chronic obstructive pulmonary disease (HCC) 07/20/2017   Past Medical History:  Diagnosis Date  . 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 COPD (chronic obstructive pulmonary disease) (HCC)   . Gout   . Hepatitis C virus carrier state (HCC)   . Hypertension   . Osteochondrosis   . Sickle cell anemia (HCC)     Family History  Problem Relation Age of Onset  .  Hypertension Mother   . Hypertension Sister        twin  . Seizures Brother   . Schizophrenia Sister     History reviewed. No pertinent surgical history. Social History   Occupational History  . Not on file  Tobacco Use  . Smoking status: Current Every Day Smoker    Packs/day: 0.20  . Smokeless tobacco: Never Used  Substance and Sexual Activity  . Alcohol use: Yes    Alcohol/week: 4.2 oz    Types: 7 Cans of beer per week  . Drug use: No  . Sexual activity: Never

## 2018-02-19 ENCOUNTER — Telehealth: Payer: Self-pay | Admitting: Pharmacy Technician

## 2018-02-19 ENCOUNTER — Other Ambulatory Visit: Payer: Self-pay | Admitting: Pharmacist Clinician (PhC)/ Clinical Pharmacy Specialist

## 2018-02-19 NOTE — Telephone Encounter (Signed)
Charles [name] did not complete hep C treatment.  He stopped after 1 box of Mavyret and was supposed to take 2 months.  He told [name] he wanted to get the second box from [facility].  They made all the arrangements and gave him [facility]'s phone number.  He never followed up with them.

## 2018-02-19 NOTE — Progress Notes (Signed)
He finished a month of Mavyret but requested the medication transferred but never follow up with the other pharmacy to complete the transfer. He is off therapy now for at least 15 days now. We will bring him back for labs. No point of continuing med now with this large of a gap.

## 2018-02-28 NOTE — Pre-Procedure Instructions (Signed)
Charles [name]  02/28/2018      [facility] - [place], [place] - [address] [place] [address] Phone: [phone] Fax: [phone]    Your procedure is scheduled on March 10, 2018.  Report to [facility]mitting at 1030 AM.  Call this number if you have problems the morning of surgery:  [phone]   Remember:  No food or drink after midnight.   Continue all medications as directed by your physician except follow these medication instructions before surgery below   Take these medicines the morning of surgery with A SIP OF WATER  Albuterol inhaler-if needed (bring inhaler with you) Allopurinol (zyloprim) symbicort inhaler-bring inhaler with you cochicine (mitigare)   7 days prior to surgery STOP taking any meloxicam (mobic), Aspirin (unless otherwise instructed by your surgeon), Aleve, Naproxen, Ibuprofen, Motrin, Advil, Goody's, BC's, all herbal medications, fish oil, and all vitamins   Do not wear jewelry  Do not wear lotions, powders, or colognes, or deodorant.  Men may shave face and neck.  Do not bring valuables to the hospital.  [facility] is not responsible for any belongings or valuables.  Contacts, dentures or bridgework may not be worn into surgery.  Leave your suitcase in the car.  After surgery it may be brought to your room.  For patients admitted to the hospital, discharge time will be determined by your treatment team.  Patients discharged [name] will not be allowed to drive home.  [facility]- Preparing For Surgery  Before surgery, you can play an important role. Because skin is not sterile, your skin needs to be as free of germs as possible. You can reduce the number of germs on your skin by washing with CHG (chlorahexidine gluconate) Soap before surgery.  CHG is an antiseptic cleaner which kills germs and bonds with the skin to continue killing germs even after washing.    Oral Hygiene  is also important to reduce your risk of infection.  Remember - BRUSH YOUR TEETH THE MORNING OF SURGERY WITH YOUR REGULAR TOOTHPASTE  Please do not use if you have an allergy to CHG or antibacterial soaps. If your skin becomes reddened/irritated stop using the CHG.  Do not shave (including legs and underarms) for at least 48 hours prior to first CHG shower. It is OK to shave your face.  Please follow these instructions carefully.   1. Shower the NIGHT BEFORE SURGERY and the MORNING OF SURGERY with CHG.   2. If you chose to wash your hair, wash your hair first as usual with your normal shampoo.  3. After you shampoo, rinse your hair and body thoroughly to remove the shampoo.  4. Use CHG as you would any other liquid soap. You can apply CHG directly to the skin and wash gently with a scrungie or a clean washcloth.   5. Apply the CHG Soap to your body ONLY FROM THE NECK DOWN.  Do not use on open wounds or open sores. Avoid contact with your eyes, ears, mouth and genitals (private parts). Wash Face and genitals (private parts)  with your normal soap.  6. Wash thoroughly, paying special attention to the area where your surgery will be performed.  7. Thoroughly rinse your body with warm water from the neck down.  8. DO NOT shower/wash with your normal soap after using and rinsing off the CHG Soap.  9. Pat yourself dry with a CLEAN TOWEL.  10.  Wear CLEAN PAJAMAS to bed the night before surgery, wear comfortable clothes the morning of surgery  11. Place CLEAN SHEETS on your bed the night of your first shower and DO NOT SLEEP WITH PETS.   [name]:  Do not apply any deodorants/lotions.  Please wear clean clothes to the hospital/surgery center.   Remember to brush your teeth WITH YOUR REGULAR TOOTHPASTE.   Please read over the following fact sheets that you were given. Pain Booklet, Coughing and Deep Breathing, MRSA Information and Surgical Site Infection Prevention

## 2018-03-04 ENCOUNTER — Encounter (HOSPITAL_COMMUNITY): Payer: Self-pay

## 2018-03-04 ENCOUNTER — Other Ambulatory Visit: Payer: Self-pay

## 2018-03-04 ENCOUNTER — Encounter (HOSPITAL_COMMUNITY)
Admission: RE | Admit: 2018-03-04 | Discharge: 2018-03-04 | Disposition: A | Discharge status: Home / Self Care | Payer: Medicaid Other | Source: Ambulatory Visit | Attending: Orthopaedic Surgery | Admitting: Orthopaedic Surgery

## 2018-03-04 DIAGNOSIS — Z01812 Encounter for preprocedural laboratory examination: Secondary | ICD-10-CM | POA: Insufficient documentation

## 2018-03-04 DIAGNOSIS — M1612 Unilateral primary osteoarthritis, left hip: Secondary | ICD-10-CM | POA: Insufficient documentation

## 2018-03-04 DIAGNOSIS — M879 Osteonecrosis, unspecified: Secondary | ICD-10-CM | POA: Insufficient documentation

## 2018-03-04 HISTORY — DX: Unilateral primary osteoarthritis, unspecified hip: M16.10

## 2018-03-04 LAB — COMPREHENSIVE METABOLIC PANEL
ALK PHOS: 96 U/L (ref 38–126)
ALT: 69 U/L — ABNORMAL HIGH (ref 17–63)
AST: 105 U/L — AB (ref 15–41)
Albumin: 3.3 g/dL — ABNORMAL LOW (ref 3.5–5.0)
Anion gap: 8 (ref 5–15)
BILIRUBIN TOTAL: 1.1 mg/dL (ref 0.3–1.2)
BUN: 15 mg/dL (ref 6–20)
CALCIUM: 8.7 mg/dL — AB (ref 8.9–10.3)
CO2: 29 mmol/L (ref 22–32)
CREATININE: 0.93 mg/dL (ref 0.61–1.24)
Chloride: 102 mmol/L (ref 101–111)
GFR calc Af Amer: >60 mL/min (ref >60)
Glucose, Bld: 182 mg/dL — ABNORMAL HIGH (ref 65–99)
POTASSIUM: 3.9 mmol/L (ref 3.5–5.1)
Sodium: 139 mmol/L (ref 135–145)
TOTAL PROTEIN: 7 g/dL (ref 6.5–8.1)

## 2018-03-04 LAB — CBC WITH DIFFERENTIAL/PLATELET
Abs Immature Granulocytes: 0 10*3/uL (ref 0.0–0.1)
Basophils Absolute: 0.1 10*3/uL (ref 0.0–0.1)
Basophils Relative: 2 %
EOS ABS: 0 10*3/uL (ref 0.0–0.7)
EOS PCT: 1 %
HEMATOCRIT: 37.2 % — AB (ref 39.0–52.0)
Hemoglobin: 12.9 g/dL — ABNORMAL LOW (ref 13.0–17.0)
IMMATURE GRANULOCYTES: 1 %
LYMPHS ABS: 1.6 10*3/uL (ref 0.7–4.0)
Lymphocytes Relative: 40 %
MCH: 32.9 pg (ref 26.0–34.0)
MCHC: 34.7 g/dL (ref 30.0–36.0)
MCV: 94.9 fL (ref 78.0–100.0)
MONOS PCT: 15 %
Monocytes Absolute: 0.6 10*3/uL (ref 0.1–1.0)
NEUTROS PCT: 41 %
Neutro Abs: 1.6 10*3/uL — ABNORMAL LOW (ref 1.7–7.7)
Platelets: 233 10*3/uL (ref 150–400)
RBC: 3.92 MIL/uL — ABNORMAL LOW (ref 4.22–5.81)
RDW: 16.7 % — AB (ref 11.5–15.5)
WBC: 3.9 10*3/uL — ABNORMAL LOW (ref 4.0–10.5)

## 2018-03-04 LAB — TYPE AND SCREEN
ABO/RH(D): O POS
ANTIBODY SCREEN: NEGATIVE

## 2018-03-04 LAB — RAPID URINE DRUG SCREEN, HOSP PERFORMED
AMPHETAMINES: NOT DETECTED
Barbiturates: NOT DETECTED
Benzodiazepines: NOT DETECTED
COCAINE: NOT DETECTED
OPIATES: NOT DETECTED
TETRAHYDROCANNABINOL: POSITIVE — AB

## 2018-03-04 LAB — ABO/RH: ABO/RH(D): O POS

## 2018-03-04 LAB — APTT: aPTT: 33 seconds (ref 24–36)

## 2018-03-04 LAB — SURGICAL PCR SCREEN
MRSA, PCR: NEGATIVE
STAPHYLOCOCCUS AUREUS: NEGATIVE

## 2018-03-04 LAB — PROTIME-INR
INR: 1.28
PROTHROMBIN TIME: 15.9 s — AB (ref 11.4–15.2)

## 2018-03-04 NOTE — Progress Notes (Signed)
PCP - FNP- Joaquin Courts  Cardiologist - Denies  Chest x-ray - 09/06/18 (E)  EKG - 01/09/18 (E)  Stress Test - Denies  ECHO - 01/16/18 (E)  Cardiac Cath - Denies  Sleep Study - Denies CPAP - None  LABS- 03/04/18: CBC w/D, CMP, PT, PTT, T/S  ASA- Denies   Anesthesia- No  Pt denies having chest pain, sob, or fever at this time. All instructions explained to the pt, with a verbal understanding of the material. Pt agrees to go over the instructions while at home for a better understanding. The opportunity to ask questions was provided.

## 2018-03-04 NOTE — Progress Notes (Addendum)
PA Mardella Layman of Dr. Roda Shutters made aware of PT 15.9 and U-drug screen results. Will send to anesthesia for review.

## 2018-03-05 ENCOUNTER — Encounter (HOSPITAL_COMMUNITY): Payer: Self-pay

## 2018-03-05 NOTE — Progress Notes (Signed)
Anesthesia Chart Review:   Case:  [id number] Date/Time:  03/10/18 1215   Procedure:  LEFT TOTAL HIP ARTHROPLASTY ANTERIOR APPROACH (Left Hip)   Anesthesia type:  Spinal   Pre-op diagnosis:  left hip degenerative joint disease, avascular necrosis   Location:  [facility] ROOM 04 / [facility] OR   Surgeon:  Tarry Kos, MD      DISCUSSION: - Pt is a 57 year old male with hx HTN, COPD, hepatitis C  - Pt has cardiac clearance for surgery  - PT 15.9; will recheck day of surgery   VS: BP (!) 158/92   Pulse 76   Temp 36.7 C   Resp 20   Ht 6' (1.829 m)   Wt 126 lb 1.6 oz (57.2 kg)   SpO2 97%   BMI 17.10 kg/m    PROVIDERS: Bing Neighbors, FNP  ID is Johny Sax, MD  - [address] [place] with cardiology for pre-op eval.  Echo ordered, results below.  Pt cleared for surgery in comment on echo   LABS:  - AST 105, ALT 69. Pt has chronic hepatitis.  - PT 15.9, PTT normal. Will recheck PT day of surgery  (all labs ordered are listed, but only abnormal results are displayed)  Labs Reviewed  RAPID URINE DRUG SCREEN, HOSP PERFORMED - Abnormal; Notable for the following components:      Result Value   Tetrahydrocannabinol POSITIVE (*)    All other components within normal limits  CBC WITH DIFFERENTIAL/PLATELET - Abnormal; Notable for the following components:   WBC 3.9 (*)    RBC 3.92 (*)    Hemoglobin 12.9 (*)    HCT 37.2 (*)    RDW 16.7 (*)    Neutro Abs 1.6 (*)    All other components within normal limits  COMPREHENSIVE METABOLIC PANEL - Abnormal; Notable for the following components:   Glucose, Bld 182 (*)    Calcium 8.7 (*)    Albumin 3.3 (*)    AST 105 (*)    ALT 69 (*)    All other components within normal limits  PROTIME-INR - Abnormal; Notable for the following components:   Prothrombin Time 15.9 (*)    All other components within normal limits  SURGICAL PCR SCREEN  APTT  TYPE AND SCREEN  ABO/RH     IMAGES:  CXR 09/06/17: No active cardiopulmonary  disease.   EKG 01/09/18: NSR.  Voltage criteria for LVH.   CV:  Echo 01/16/18:  - Left ventricle: The cavity size was normal. Systolic function was normal. The estimated ejection fraction was in the range of 55% to 60%. Wall motion was normal; there were no regional wall motion abnormalities. Doppler parameters are consistent with abnormal left ventricular relaxation (grade 1 diastolic dysfunction). Doppler parameters are consistent with   indeterminate ventricular filling pressure. - Aortic valve: Transvalvular velocity was within the normal range. There was no stenosis. There was no regurgitation. - Mitral valve: Transvalvular velocity was within the normal range. There was no evidence for stenosis. There was no regurgitation. - Right ventricle: The cavity size was normal. Wall thickness was normal. Systolic function was normal. - Atrial septum: No defect or patent foramen ovale was identified. - Tricuspid valve: There was no regurgitation   Past Medical History:  Diagnosis Date  . 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 COPD (chronic obstructive pulmonary disease) (HCC)   . Gout   . Hepatitis C virus carrier state (HCC)   .  Hypertension   . Osteochondrosis   . Primary localized osteoarthritis of hip    Left  . Sickle cell anemia (HCC)     History reviewed. No pertinent surgical history.  MEDICATIONS: . albuterol (PROVENTIL HFA;VENTOLIN HFA) 108 (90 Base) MCG/ACT inhaler  . allopurinol (ZYLOPRIM) 100 MG tablet  . budesonide-formoterol (SYMBICORT) 80-4.5 MCG/ACT inhaler  . Colchicine (MITIGARE) 0.6 MG CAPS  . Glecaprevir-Pibrentasvir (MAVYRET) 100-40 MG TABS  . hydrochlorothiazide (HYDRODIURIL) 25 MG tablet  . losartan (COZAAR) 50 MG tablet  . meloxicam (MOBIC) 15 MG tablet  . SUMAtriptan (IMITREX) 100 MG tablet  . tamsulosin (FLOMAX) 0.4 MG CAPS capsule   No current facility-administered medications for this encounter.     If labs acceptable day of  surgery, I anticipate pt can proceed with surgery as scheduled.  Rica Mast, FNP-BC [facility] Short Stay Surgical Center/Anesthesiology Phone: [phone] 03/05/2018 11:36 AM

## 2018-03-06 ENCOUNTER — Ambulatory Visit: Payer: Medicaid Other

## 2018-03-07 MED ORDER — TRANEXAMIC ACID 1000 MG/10ML IV SOLN
1000.0000 mg | INTRAVENOUS | Status: DC
  Filled 2018-03-07: qty 10

## 2018-03-07 MED ORDER — TRANEXAMIC ACID 1000 MG/10ML IV SOLN
2000.0000 mg | INTRAVENOUS | Status: AC
  Administered 2018-03-10: 2000 mg via TOPICAL
  Filled 2018-03-07: qty 20

## 2018-03-10 ENCOUNTER — Encounter (HOSPITAL_COMMUNITY): Payer: Self-pay | Admitting: *Deleted

## 2018-03-10 ENCOUNTER — Inpatient Hospital Stay (HOSPITAL_COMMUNITY): Payer: Medicaid Other

## 2018-03-10 ENCOUNTER — Inpatient Hospital Stay (HOSPITAL_COMMUNITY): Payer: Medicaid Other | Admitting: Emergency Medicine

## 2018-03-10 ENCOUNTER — Inpatient Hospital Stay (HOSPITAL_COMMUNITY)
Admission: RE | Admit: 2018-03-10 | Discharge: 2018-03-12 | DRG: 470 | Disposition: A | Discharge status: Skilled Nursing Facility | Payer: Medicaid Other | Attending: Orthopaedic Surgery | Admitting: Orthopaedic Surgery

## 2018-03-10 ENCOUNTER — Encounter (HOSPITAL_COMMUNITY)
Admission: RE | Disposition: A | Discharge status: Skilled Nursing Facility | Payer: Self-pay | Source: Home / Self Care | Attending: Orthopaedic Surgery

## 2018-03-10 DIAGNOSIS — I10 Essential (primary) hypertension: Secondary | ICD-10-CM | POA: Diagnosis present

## 2018-03-10 DIAGNOSIS — F17210 Nicotine dependence, cigarettes, uncomplicated: Secondary | ICD-10-CM | POA: Diagnosis present

## 2018-03-10 DIAGNOSIS — B182 Chronic viral hepatitis C: Secondary | ICD-10-CM | POA: Diagnosis present

## 2018-03-10 DIAGNOSIS — Z7951 Long term (current) use of inhaled steroids: Secondary | ICD-10-CM

## 2018-03-10 DIAGNOSIS — M1612 Unilateral primary osteoarthritis, left hip: Principal | ICD-10-CM | POA: Diagnosis present

## 2018-03-10 DIAGNOSIS — M109 Gout, unspecified: Secondary | ICD-10-CM | POA: Diagnosis present

## 2018-03-10 DIAGNOSIS — J449 Chronic obstructive pulmonary disease, unspecified: Secondary | ICD-10-CM | POA: Diagnosis present

## 2018-03-10 DIAGNOSIS — Z791 Long term (current) use of non-steroidal anti-inflammatories (NSAID): Secondary | ICD-10-CM | POA: Diagnosis not present

## 2018-03-10 DIAGNOSIS — M87052 Idiopathic aseptic necrosis of left femur: Secondary | ICD-10-CM | POA: Diagnosis not present

## 2018-03-10 DIAGNOSIS — M879 Osteonecrosis, unspecified: Secondary | ICD-10-CM | POA: Diagnosis present

## 2018-03-10 DIAGNOSIS — D62 Acute posthemorrhagic anemia: Secondary | ICD-10-CM | POA: Diagnosis not present

## 2018-03-10 DIAGNOSIS — Z96642 Presence of left artificial hip joint: Secondary | ICD-10-CM

## 2018-03-10 DIAGNOSIS — Z8249 Family history of ischemic heart disease and other diseases of the circulatory system: Secondary | ICD-10-CM

## 2018-03-10 DIAGNOSIS — Z419 Encounter for procedure for purposes other than remedying health state, unspecified: Secondary | ICD-10-CM

## 2018-03-10 DIAGNOSIS — Z79899 Other long term (current) drug therapy: Secondary | ICD-10-CM | POA: Diagnosis not present

## 2018-03-10 DIAGNOSIS — Z96649 Presence of unspecified artificial hip joint: Secondary | ICD-10-CM

## 2018-03-10 HISTORY — PX: TOTAL HIP ARTHROPLASTY: SHX124

## 2018-03-10 HISTORY — DX: Presence of left artificial hip joint: Z96.642

## 2018-03-10 LAB — PROTIME-INR
INR: 0.99
PROTHROMBIN TIME: 12.9 s (ref 11.4–15.2)

## 2018-03-10 SURGERY — ARTHROPLASTY, HIP, TOTAL, ANTERIOR APPROACH
Anesthesia: Monitor Anesthesia Care | Site: Hip | Laterality: Left

## 2018-03-10 MED ORDER — ACETAMINOPHEN 325 MG PO TABS
325.0000 mg | ORAL_TABLET | Freq: Four times a day (QID) | ORAL | Status: DC | PRN
  Filled 2018-03-10: qty 2

## 2018-03-10 MED ORDER — METHOCARBAMOL 500 MG PO TABS
500.0000 mg | ORAL_TABLET | Freq: Four times a day (QID) | ORAL | Status: DC | PRN
  Administered 2018-03-10 – 2018-03-12 (×5): 500 mg via ORAL
  Filled 2018-03-10 (×5): qty 1

## 2018-03-10 MED ORDER — ONDANSETRON HCL 4 MG/2ML IJ SOLN
INTRAMUSCULAR | Status: AC
  Filled 2018-03-10: qty 2

## 2018-03-10 MED ORDER — KETOROLAC TROMETHAMINE 15 MG/ML IJ SOLN
30.0000 mg | Freq: Four times a day (QID) | INTRAMUSCULAR | Status: AC
  Administered 2018-03-10 – 2018-03-11 (×4): 30 mg via INTRAVENOUS
  Filled 2018-03-10 (×3): qty 2

## 2018-03-10 MED ORDER — OXYCODONE HCL ER 10 MG PO T12A: 10.0000 mg | EXTENDED_RELEASE_TABLET | Freq: Two times a day (BID) | ORAL | 0 refills | Status: AC

## 2018-03-10 MED ORDER — TRANEXAMIC ACID 1000 MG/10ML IV SOLN
1000.0000 mg | Freq: Once | INTRAVENOUS | Status: AC
  Administered 2018-03-10: 1000 mg via INTRAVENOUS
  Filled 2018-03-10: qty 10

## 2018-03-10 MED ORDER — DEXTROSE 5 % IV SOLN
INTRAVENOUS | Status: DC | PRN
  Administered 2018-03-10: 25 ug/min via INTRAVENOUS

## 2018-03-10 MED ORDER — LACTATED RINGERS IV SOLN
INTRAVENOUS | Status: DC
  Administered 2018-03-10 (×2): via INTRAVENOUS

## 2018-03-10 MED ORDER — LOSARTAN POTASSIUM 50 MG PO TABS
50.0000 mg | ORAL_TABLET | Freq: Every day | ORAL | Status: DC
  Administered 2018-03-10 – 2018-03-12 (×3): 50 mg via ORAL
  Filled 2018-03-10 (×3): qty 1

## 2018-03-10 MED ORDER — ACETAMINOPHEN 500 MG PO TABS
1000.0000 mg | ORAL_TABLET | Freq: Four times a day (QID) | ORAL | Status: AC
  Administered 2018-03-10 – 2018-03-11 (×4): 1000 mg via ORAL
  Filled 2018-03-10 (×4): qty 2

## 2018-03-10 MED ORDER — PROMETHAZINE HCL 25 MG PO TABS: 25.0000 mg | ORAL_TABLET | Freq: Four times a day (QID) | ORAL | 1 refills | Status: DC | PRN

## 2018-03-10 MED ORDER — PROPOFOL 500 MG/50ML IV EMUL
INTRAVENOUS | Status: DC | PRN
  Administered 2018-03-10: 100 ug/kg/min via INTRAVENOUS

## 2018-03-10 MED ORDER — DIPHENHYDRAMINE HCL 12.5 MG/5ML PO ELIX
25.0000 mg | ORAL_SOLUTION | ORAL | Status: DC | PRN
  Administered 2018-03-11: 25 mg via ORAL
  Filled 2018-03-10: qty 10

## 2018-03-10 MED ORDER — DEXAMETHASONE SODIUM PHOSPHATE 10 MG/ML IJ SOLN
10.0000 mg | Freq: Once | INTRAMUSCULAR | Status: AC
  Administered 2018-03-11: 10 mg via INTRAVENOUS
  Filled 2018-03-10: qty 1

## 2018-03-10 MED ORDER — SENNOSIDES-DOCUSATE SODIUM 8.6-50 MG PO TABS: 1.0000 | ORAL_TABLET | Freq: Every evening | ORAL | 1 refills | Status: DC | PRN

## 2018-03-10 MED ORDER — PROPOFOL 10 MG/ML IV BOLUS
INTRAVENOUS | Status: DC | PRN
  Administered 2018-03-10: 20 mg via INTRAVENOUS
  Administered 2018-03-10: 30 mg via INTRAVENOUS

## 2018-03-10 MED ORDER — CEFAZOLIN SODIUM-DEXTROSE 2-4 GM/100ML-% IV SOLN
2.0000 g | INTRAVENOUS | Status: AC
  Administered 2018-03-10: 2 g via INTRAVENOUS
  Filled 2018-03-10: qty 100

## 2018-03-10 MED ORDER — HYDROMORPHONE HCL 2 MG/ML IJ SOLN: 0.5000 mg | INTRAMUSCULAR | Status: DC | PRN

## 2018-03-10 MED ORDER — PROMETHAZINE HCL 25 MG/ML IJ SOLN: 6.2500 mg | INTRAMUSCULAR | Status: DC | PRN

## 2018-03-10 MED ORDER — POLYETHYLENE GLYCOL 3350 17 G PO PACK: 17.0000 g | PACK | Freq: Every day | ORAL | Status: DC | PRN

## 2018-03-10 MED ORDER — OXYCODONE HCL 5 MG PO TABS
ORAL_TABLET | ORAL | Status: AC
  Filled 2018-03-10: qty 1

## 2018-03-10 MED ORDER — SORBITOL 70 % SOLN: 30.0000 mL | Freq: Every day | Status: DC | PRN

## 2018-03-10 MED ORDER — CHLORHEXIDINE GLUCONATE 4 % EX LIQD: 60.0000 mL | Freq: Once | CUTANEOUS | Status: DC

## 2018-03-10 MED ORDER — MENTHOL 3 MG MT LOZG: 1.0000 | LOZENGE | OROMUCOSAL | Status: DC | PRN

## 2018-03-10 MED ORDER — SODIUM CHLORIDE 0.9 % IR SOLN
Status: DC | PRN
  Administered 2018-03-10: 3000 mL

## 2018-03-10 MED ORDER — HYDROMORPHONE HCL 2 MG/ML IJ SOLN
0.3000 mg | INTRAMUSCULAR | Status: DC | PRN
Start: 2018-03-10 — End: 2018-03-10

## 2018-03-10 MED ORDER — ALUM & MAG HYDROXIDE-SIMETH 200-200-20 MG/5ML PO SUSP: 30.0000 mL | ORAL | Status: DC | PRN

## 2018-03-10 MED ORDER — DOCUSATE SODIUM 100 MG PO CAPS
100.0000 mg | ORAL_CAPSULE | Freq: Two times a day (BID) | ORAL | Status: DC
  Administered 2018-03-10 – 2018-03-12 (×4): 100 mg via ORAL
  Filled 2018-03-10 (×4): qty 1

## 2018-03-10 MED ORDER — PHENOL 1.4 % MT LIQD: 1.0000 | OROMUCOSAL | Status: DC | PRN

## 2018-03-10 MED ORDER — OXYCODONE HCL 5 MG PO TABS
10.0000 mg | ORAL_TABLET | ORAL | Status: DC | PRN
  Administered 2018-03-11: 10 mg via ORAL
  Administered 2018-03-11 (×2): 15 mg via ORAL
  Administered 2018-03-12: 10 mg via ORAL
  Administered 2018-03-12: 15 mg via ORAL
  Filled 2018-03-10 (×3): qty 3

## 2018-03-10 MED ORDER — VANCOMYCIN HCL 1000 MG IV SOLR
INTRAVENOUS | Status: DC | PRN
  Administered 2018-03-10: 1000 mg via TOPICAL

## 2018-03-10 MED ORDER — FENTANYL CITRATE (PF) 100 MCG/2ML IJ SOLN
INTRAMUSCULAR | Status: DC | PRN
  Administered 2018-03-10: 50 ug via INTRAVENOUS

## 2018-03-10 MED ORDER — ALBUTEROL SULFATE (2.5 MG/3ML) 0.083% IN NEBU
3.0000 mL | INHALATION_SOLUTION | RESPIRATORY_TRACT | Status: DC | PRN
  Administered 2018-03-11: 3 mL via RESPIRATORY_TRACT
  Filled 2018-03-10: qty 3

## 2018-03-10 MED ORDER — ASPIRIN 81 MG PO CHEW
81.0000 mg | CHEWABLE_TABLET | Freq: Two times a day (BID) | ORAL | Status: DC
  Administered 2018-03-11 – 2018-03-12 (×3): 81 mg via ORAL
  Filled 2018-03-10 (×3): qty 1

## 2018-03-10 MED ORDER — OXYCODONE HCL ER 10 MG PO T12A
10.0000 mg | EXTENDED_RELEASE_TABLET | Freq: Two times a day (BID) | ORAL | Status: DC
  Administered 2018-03-10 – 2018-03-12 (×4): 10 mg via ORAL
  Filled 2018-03-10 (×4): qty 1

## 2018-03-10 MED ORDER — VANCOMYCIN HCL 1000 MG IV SOLR
INTRAVENOUS | Status: AC
  Filled 2018-03-10: qty 1000

## 2018-03-10 MED ORDER — OXYCODONE HCL 5 MG PO TABS: 5.0000 mg | ORAL_TABLET | ORAL | 0 refills | Status: DC | PRN

## 2018-03-10 MED ORDER — OXYCODONE HCL 5 MG PO TABS
5.0000 mg | ORAL_TABLET | ORAL | Status: DC | PRN
  Administered 2018-03-10: 5 mg via ORAL
  Administered 2018-03-12: 10 mg via ORAL
  Filled 2018-03-10 (×2): qty 2

## 2018-03-10 MED ORDER — METHOCARBAMOL 1000 MG/10ML IJ SOLN
500.0000 mg | Freq: Four times a day (QID) | INTRAVENOUS | Status: DC | PRN
  Filled 2018-03-10: qty 5

## 2018-03-10 MED ORDER — ONDANSETRON HCL 4 MG PO TABS: 4.0000 mg | ORAL_TABLET | Freq: Three times a day (TID) | ORAL | 0 refills | Status: DC | PRN

## 2018-03-10 MED ORDER — MIDAZOLAM HCL 5 MG/5ML IJ SOLN
INTRAMUSCULAR | Status: DC | PRN
  Administered 2018-03-10: 2 mg via INTRAVENOUS

## 2018-03-10 MED ORDER — TAMSULOSIN HCL 0.4 MG PO CAPS
0.4000 mg | ORAL_CAPSULE | Freq: Every day | ORAL | Status: DC
  Administered 2018-03-10 – 2018-03-12 (×3): 0.4 mg via ORAL
  Filled 2018-03-10 (×3): qty 1

## 2018-03-10 MED ORDER — ONDANSETRON HCL 4 MG PO TABS: 4.0000 mg | ORAL_TABLET | Freq: Four times a day (QID) | ORAL | Status: DC | PRN

## 2018-03-10 MED ORDER — 0.9 % SODIUM CHLORIDE (POUR BTL) OPTIME
TOPICAL | Status: DC | PRN
  Administered 2018-03-10: 1000 mL

## 2018-03-10 MED ORDER — KETOROLAC TROMETHAMINE 15 MG/ML IJ SOLN
INTRAMUSCULAR | Status: AC
  Filled 2018-03-10: qty 2

## 2018-03-10 MED ORDER — METHOCARBAMOL 500 MG PO TABS
ORAL_TABLET | ORAL | Status: AC
  Administered 2018-03-10: 500 mg
  Filled 2018-03-10: qty 1

## 2018-03-10 MED ORDER — ALLOPURINOL 100 MG PO TABS
100.0000 mg | ORAL_TABLET | Freq: Two times a day (BID) | ORAL | Status: DC
  Administered 2018-03-10 – 2018-03-12 (×4): 100 mg via ORAL
  Filled 2018-03-10 (×4): qty 1

## 2018-03-10 MED ORDER — MAGNESIUM CITRATE PO SOLN: 1.0000 | Freq: Once | ORAL | Status: DC | PRN

## 2018-03-10 MED ORDER — LACTATED RINGERS IV SOLN: INTRAVENOUS | Status: DC

## 2018-03-10 MED ORDER — CEFAZOLIN SODIUM-DEXTROSE 2-4 GM/100ML-% IV SOLN
2.0000 g | Freq: Four times a day (QID) | INTRAVENOUS | Status: AC
  Administered 2018-03-10 – 2018-03-11 (×3): 2 g via INTRAVENOUS
  Filled 2018-03-10 (×3): qty 100

## 2018-03-10 MED ORDER — SODIUM CHLORIDE 0.9 % IV SOLN
INTRAVENOUS | Status: DC
  Administered 2018-03-10: 22:00:00 via INTRAVENOUS

## 2018-03-10 MED ORDER — MEPERIDINE HCL 50 MG/ML IJ SOLN: 6.2500 mg | INTRAMUSCULAR | Status: DC | PRN

## 2018-03-10 MED ORDER — METOCLOPRAMIDE HCL 5 MG PO TABS: 5.0000 mg | ORAL_TABLET | Freq: Three times a day (TID) | ORAL | Status: DC | PRN

## 2018-03-10 MED ORDER — ONDANSETRON HCL 4 MG/2ML IJ SOLN: 4.0000 mg | Freq: Four times a day (QID) | INTRAMUSCULAR | Status: DC | PRN

## 2018-03-10 MED ORDER — MIDAZOLAM HCL 2 MG/2ML IJ SOLN
INTRAMUSCULAR | Status: AC
  Filled 2018-03-10: qty 2

## 2018-03-10 MED ORDER — FENTANYL CITRATE (PF) 250 MCG/5ML IJ SOLN
INTRAMUSCULAR | Status: AC
  Filled 2018-03-10: qty 5

## 2018-03-10 MED ORDER — SUMATRIPTAN SUCCINATE 100 MG PO TABS
100.0000 mg | ORAL_TABLET | ORAL | Status: DC | PRN
  Filled 2018-03-10: qty 1

## 2018-03-10 MED ORDER — PHENYLEPHRINE HCL 10 MG/ML IJ SOLN
INTRAMUSCULAR | Status: DC | PRN
  Administered 2018-03-10 (×2): 120 ug via INTRAVENOUS

## 2018-03-10 MED ORDER — ASPIRIN EC 81 MG PO TBEC: 81.0000 mg | DELAYED_RELEASE_TABLET | Freq: Two times a day (BID) | ORAL | 0 refills | Status: DC

## 2018-03-10 MED ORDER — ONDANSETRON HCL 4 MG/2ML IJ SOLN
INTRAMUSCULAR | Status: DC | PRN
  Administered 2018-03-10: 4 mg via INTRAVENOUS

## 2018-03-10 MED ORDER — GABAPENTIN 300 MG PO CAPS
300.0000 mg | ORAL_CAPSULE | Freq: Three times a day (TID) | ORAL | Status: DC
  Administered 2018-03-10 – 2018-03-12 (×5): 300 mg via ORAL
  Filled 2018-03-10 (×5): qty 1

## 2018-03-10 MED ORDER — METHOCARBAMOL 750 MG PO TABS: 750.0000 mg | ORAL_TABLET | Freq: Two times a day (BID) | ORAL | 0 refills | Status: DC | PRN

## 2018-03-10 MED ORDER — METOCLOPRAMIDE HCL 5 MG/ML IJ SOLN: 5.0000 mg | Freq: Three times a day (TID) | INTRAMUSCULAR | Status: DC | PRN

## 2018-03-10 MED ORDER — ALBUMIN HUMAN 5 % IV SOLN
INTRAVENOUS | Status: DC | PRN
  Administered 2018-03-10 (×2): via INTRAVENOUS

## 2018-03-10 SURGICAL SUPPLY — 49 items
BAG DECANTER FOR FLEXI CONT (MISCELLANEOUS) ×3 IMPLANT
CAPT HIP TOTAL 2 ×3 IMPLANT
CELLS DAT CNTRL 66122 CELL SVR (MISCELLANEOUS) IMPLANT
CLOSURE STERI-STRIP 1/2X4 (GAUZE/BANDAGES/DRESSINGS) ×2
CLSR STERI-STRIP ANTIMIC 1/2X4 (GAUZE/BANDAGES/DRESSINGS) ×4 IMPLANT
COVER SURGICAL LIGHT HANDLE (MISCELLANEOUS) ×3 IMPLANT
DRAPE C-ARM 42X72 X-RAY (DRAPES) ×3 IMPLANT
DRAPE POUCH INSTRU U-SHP 10X18 (DRAPES) ×3 IMPLANT
DRAPE STERI IOBAN 125X83 (DRAPES) ×3 IMPLANT
DRAPE U-SHAPE 47X51 STRL (DRAPES) ×6 IMPLANT
DRSG AQUACEL AG ADV 3.5X10 (GAUZE/BANDAGES/DRESSINGS) ×3 IMPLANT
DURAPREP 26ML APPLICATOR (WOUND CARE) ×3 IMPLANT
ELECT BLADE 4.0 EZ CLEAN MEGAD (MISCELLANEOUS) ×3
ELECT REM PT RETURN 9FT ADLT (ELECTROSURGICAL) ×3
ELECTRODE BLDE 4.0 EZ CLN MEGD (MISCELLANEOUS) ×1 IMPLANT
ELECTRODE REM PT RTRN 9FT ADLT (ELECTROSURGICAL) ×1 IMPLANT
GLOVE BIOGEL PI IND STRL 7.0 (GLOVE) ×1 IMPLANT
GLOVE BIOGEL PI INDICATOR 7.0 (GLOVE) ×2
GLOVE ECLIPSE 7.0 STRL STRAW (GLOVE) ×3 IMPLANT
GLOVE SKINSENSE NS SZ7.5 (GLOVE) ×2
GLOVE SKINSENSE STRL SZ7.5 (GLOVE) ×1 IMPLANT
GLOVE SURG SYN 7.5  E (GLOVE) ×4
GLOVE SURG SYN 7.5 E (GLOVE) ×2 IMPLANT
GOWN SRG XL XLNG 56XLVL 4 (GOWN DISPOSABLE) ×1 IMPLANT
GOWN STRL NON-REIN XL XLG LVL4 (GOWN DISPOSABLE) ×2
GOWN STRL REUS W/ TWL LRG LVL3 (GOWN DISPOSABLE) IMPLANT
GOWN STRL REUS W/TWL LRG LVL3 (GOWN DISPOSABLE)
HANDPIECE INTERPULSE COAX TIP (DISPOSABLE) ×2
HOOD PEEL AWAY FLYTE STAYCOOL (MISCELLANEOUS) ×6 IMPLANT
IV NS IRRIG 3000ML ARTHROMATIC (IV SOLUTION) ×3 IMPLANT
KIT BASIN OR (CUSTOM PROCEDURE TRAY) ×3 IMPLANT
MARKER SKIN DUAL TIP RULER LAB (MISCELLANEOUS) ×3 IMPLANT
NEEDLE SPNL 18GX3.5 QUINCKE PK (NEEDLE) ×3 IMPLANT
PACK TOTAL JOINT (CUSTOM PROCEDURE TRAY) ×3 IMPLANT
PACK UNIVERSAL I (CUSTOM PROCEDURE TRAY) ×3 IMPLANT
RTRCTR WOUND ALEXIS 18CM MED (MISCELLANEOUS)
SAW OSC TIP CART 19.5X105X1.3 (SAW) ×3 IMPLANT
SET HNDPC FAN SPRY TIP SCT (DISPOSABLE) ×1 IMPLANT
STAPLER VISISTAT 35W (STAPLE) IMPLANT
SUT ETHIBOND 2 V 37 (SUTURE) ×3 IMPLANT
SUT MNCRL AB 4-0 PS2 18 (SUTURE) ×3 IMPLANT
SUT VIC AB 1 CT1 27 (SUTURE) ×2
SUT VIC AB 1 CT1 27XBRD ANBCTR (SUTURE) ×1 IMPLANT
SUT VIC AB 2-0 CT1 27 (SUTURE) ×4
SUT VIC AB 2-0 CT1 TAPERPNT 27 (SUTURE) ×2 IMPLANT
SYRINGE 60CC LL (MISCELLANEOUS) ×3 IMPLANT
TOWEL OR 17X26 10 PK STRL BLUE (TOWEL DISPOSABLE) ×3 IMPLANT
TRAY CATH 16FR W/PLASTIC CATH (SET/KITS/TRAYS/PACK) IMPLANT
YANKAUER SUCT BULB TIP NO VENT (SUCTIONS) ×3 IMPLANT

## 2018-03-10 NOTE — Anesthesia Procedure Notes (Signed)
Procedure Name: MAC Date/Time: 03/10/2018 1:16 PM Performed by: Lance Coon, CRNA Pre-anesthesia Checklist: Patient identified, Emergency Drugs available, Suction available, Patient being monitored and Timeout performed Patient Re-evaluated:Patient Re-evaluated prior to induction Oxygen Delivery Method: Nasal cannula

## 2018-03-10 NOTE — H&P (Signed)
PREOPERATIVE H&P  Chief Complaint: left hip degenerative joint disease, avascular necrosis  HPI: Charles [name] is a 57 y.o. male who presents for surgical treatment of left hip degenerative joint disease, avascular necrosis.  He denies any changes in medical history.  Past Medical History:  Diagnosis Date  . 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 COPD (chronic obstructive pulmonary disease) (HCC)   . Gout   . Hepatitis C virus carrier state (HCC)   . Hypertension   . Osteochondrosis   . Primary localized osteoarthritis of hip    Left   No past surgical history on file. Social History   Socioeconomic History  . Marital status: Single    Spouse name: Not on file  . Number of children: Not on file  . Years of education: Not on file  . Highest education level: Not on file  Occupational History  . Not on file  Social Needs  . Financial resource strain: Not on file  . Food insecurity:    Worry: Not on file    Inability: Not on file  . Transportation needs:    Medical: Not on file    Non-medical: Not on file  Tobacco Use  . Smoking status: Current Every Day Smoker    Packs/day: 0.20  . Smokeless tobacco: Never Used  Substance and Sexual Activity  . Alcohol use: Yes    Alcohol/week: 4.2 oz    Types: 7 Cans of beer per week    Comment: OCCASIONAL  . Drug use: No  . Sexual activity: Never  Lifestyle  . Physical activity:    Days per week: Not on file    Minutes per session: Not on file  . Stress: Not on file  Relationships  . Social connections:    Talks on phone: Not on file    Gets together: Not on file    Attends religious service: Not on file    Active member of club or organization: Not on file    Attends meetings of clubs or organizations: Not on file    Relationship status: Not on file  Other Topics Concern  . Not on file  Social History Narrative  . Not on file   Family History  Problem Relation Age of Onset  . Hypertension Mother    . Hypertension Sister        twin  . Seizures Brother   . Schizophrenia Sister    No Known Allergies Prior to Admission medications   Medication Sig Start Date End Date Taking? Authorizing Provider  albuterol (PROVENTIL HFA;VENTOLIN HFA) 108 (90 Base) MCG/ACT inhaler Inhale 2 puffs into the lungs every 4 (four) hours as needed for wheezing or shortness of breath (cough, shortness of breath or wheezing.). 08/09/17  Yes Bing Neighbors, FNP  allopurinol (ZYLOPRIM) 100 MG tablet Take 100 mg by mouth 2 (two) times daily.    Yes [provider]  budesonide-formoterol (SYMBICORT) 80-4.5 MCG/ACT inhaler Inhale 2 puffs into the lungs 2 (two) times daily.   Yes [provider]  Colchicine (MITIGARE) 0.6 MG CAPS Take 0.6 mg by mouth daily as needed (gout flare).    Yes [provider]  hydrochlorothiazide (HYDRODIURIL) 25 MG tablet Take 1 tablet (25 mg total) by mouth daily. 11/11/17  Yes Bing Neighbors, FNP  losartan (COZAAR) 50 MG tablet Take 1 tablet (50 mg total) by mouth daily. 11/11/17  Yes Bing Neighbors, FNP  meloxicam [facility]) 15  MG tablet Take 1 tablet (15 mg total) by mouth daily. 11/11/17  Yes Bing NeighborsHarris, Kimberly S, FNP  SUMAtriptan (IMITREX) 100 MG tablet Take 100 mg by mouth every 2 (two) hours as needed for migraine. May repeat in 2 hours if headache persists or recurs.   Yes [provider]  tamsulosin (FLOMAX) 0.4 MG CAPS capsule Take 0.4 mg by mouth daily.    Yes [provider]  Glecaprevir-Pibrentasvir (MAVYRET) 100-40 MG TABS Take 3 tablets by mouth daily with supper. 01/01/18   Ginnie SmartHatcher, Jeffrey C, MD     Positive ROS: All other systems have been reviewed and were otherwise negative with the exception of those mentioned in the HPI and as above.  Physical Exam: General: Alert, no acute distress Cardiovascular: No pedal edema Respiratory: No cyanosis, no use of accessory musculature GI: abdomen soft Skin: No lesions in the area of  chief complaint Neurologic: Sensation intact distally Psychiatric: Patient is competent for consent with normal mood and affect Lymphatic: no lymphedema  MUSCULOSKELETAL: exam stable  Assessment: left hip degenerative joint disease, avascular necrosis  Plan: Plan for Procedure(s): LEFT TOTAL HIP ARTHROPLASTY ANTERIOR APPROACH  The risks benefits and alternatives were discussed with the patient including but not limited to the risks of nonoperative treatment, versus surgical intervention including infection, bleeding, nerve injury,  blood clots, cardiopulmonary complications, morbidity, mortality, among others, and they were willing to proceed.   Preoperative templating of the joint replacement has been completed, documented, and submitted to the Operating Room personnel in order to optimize intra-operative equipment management.  Glee ArvinMichael Xu, MD   03/10/2018 7:22 AM

## 2018-03-10 NOTE — Transfer of Care (Signed)
Immediate Anesthesia Transfer of Care Note  Patient: Charles [name]  Procedure(s) Performed: LEFT TOTAL HIP ARTHROPLASTY ANTERIOR APPROACH (Left Hip)  Patient Location: PACU  Anesthesia Type:Spinal and MAC combined with regional for post-op pain  Level of Consciousness: awake and patient cooperative  Airway & Oxygen Therapy: Patient Spontanous Breathing  Post-op Assessment: Report given to RN and Post -op Vital signs reviewed and stable  Post vital signs: Reviewed and stable  Last Vitals:  Vitals Value Taken Time  BP 101/77 03/10/2018  3:45 PM  Temp 36.3 C 03/10/2018  3:45 PM  Pulse 75 03/10/2018  3:45 PM  Resp 14 03/10/2018  3:45 PM  SpO2 95 % 03/10/2018  3:45 PM  Vitals shown include unvalidated device data.  Last Pain:  Vitals:   03/10/18 1545  TempSrc:   PainSc: (P) 0-No pain      Patients Stated Pain Goal: 3 (97/41/63 8453)  Complications: No apparent anesthesia complications

## 2018-03-10 NOTE — Anesthesia Preprocedure Evaluation (Addendum)
Anesthesia Evaluation  Patient identified by MRN, date of birth, ID band Patient awake    Reviewed: Allergy & Precautions, NPO status , Patient's Chart, lab work & pertinent test results  Airway Mallampati: II  TM Distance: >3 FB Neck ROM: Full    Dental  (+) Poor Dentition, Dental Advisory Given,    Pulmonary COPD,  COPD inhaler, Current Smoker,    breath sounds clear to auscultation       Cardiovascular hypertension, Pt. on medications  Rhythm:Regular Rate:Normal     Neuro/Psych negative neurological ROS  negative psych ROS   GI/Hepatic (+) Hepatitis -  Endo/Other  negative endocrine ROS  Renal/GU negative Renal ROS     Musculoskeletal  (+) Arthritis , Osteoarthritis,    Abdominal Normal abdominal exam  (+)   Peds  Hematology negative hematology ROS (+)   Anesthesia Other Findings   Reproductive/Obstetrics                            Lab Results  Component Value Date   WBC 3.9 (L) 03/04/2018   HGB 12.9 (L) 03/04/2018   HCT 37.2 (L) 03/04/2018   MCV 94.9 03/04/2018   PLT 233 03/04/2018   Lab Results  Component Value Date   CREATININE 0.93 03/04/2018   BUN 15 03/04/2018   NA 139 03/04/2018   K 3.9 03/04/2018   CL 102 03/04/2018   CO2 29 03/04/2018   Lab Results  Component Value Date   INR 0.99 03/10/2018   INR 1.28 03/04/2018   INR 1.0 12/04/2017   Echo: Left ventricle: The cavity size was normal. Systolic function was   normal. The estimated ejection fraction was in the range of 55%   to 60%. Wall motion was normal; there were no regional wall   motion abnormalities. Doppler parameters are consistent with   abnormal left ventricular relaxation (grade 1 diastolic   dysfunction). Doppler parameters are consistent with   indeterminate ventricular filling pressure. - Aortic valve: Transvalvular velocity was within the normal range.   There was no stenosis. There was no  regurgitation. - Mitral valve: Transvalvular velocity was within the normal range.   There was no evidence for stenosis. There was no regurgitation. - Right ventricle: The cavity size was normal. Wall thickness was   normal. Systolic function was normal. - Atrial septum: No defect or patent foramen ovale was identified. - Tricuspid valve: There was no regurgitation.   Anesthesia Physical Anesthesia Plan  ASA: II  Anesthesia Plan: Spinal   Post-op Pain Management:    Induction: Intravenous  PONV Risk Score and Plan: Ondansetron, Propofol infusion and Midazolam  Airway Management Planned: Simple Face Mask  Additional Equipment: None  Intra-op Plan:   Post-operative Plan:   Informed Consent: I have reviewed the patients History and Physical, chart, labs and discussed the procedure including the risks, benefits and alternatives for the proposed anesthesia with the patient or authorized representative who has indicated his/her understanding and acceptance.     Plan Discussed with: CRNA  Anesthesia Plan Comments:        Anesthesia Quick Evaluation

## 2018-03-10 NOTE — Op Note (Signed)
LEFT TOTAL HIP ARTHROPLASTY ANTERIOR APPROACH  Procedure Note Barnett AbuCarl Ayre   [id number]  Pre-op Diagnosis: left hip degenerative joint disease, avascular necrosis     Post-op Diagnosis: same   Operative Procedures  1. Total hip replacement; Left hip; uncemented [id number]   Personnel  Surgeon(s): Tarry KosXu, Arrietty Dercole M, MD  Assist: Oneal GroutMary Lindsey Stanbery, PA-C; necessary for the timely completion of procedure and due to complexity of procedure.   Anesthesia: spinal  Prosthesis: Depuy Acetabulum: Pinnacle 54 mm Femur: Actis 5 STD Head: 36 mm size: +12 Liner: +4 neutral Bearing Type: ceramic on poly  Total Hip Arthroplasty (Anterior Approach) Op Note:  After informed consent was obtained and the operative extremity marked in the holding area, the patient was brought back to the operating room and placed supine on the HANA table. Next, the operative extremity was prepped and draped in normal sterile fashion. Surgical timeout occurred verifying patient identification, surgical site, surgical procedure and administration of antibiotics.  A modified anterior Smith-Peterson approach to the hip was performed, using the interval between tensor fascia lata and sartorius.  Dissection was carried bluntly down onto the anterior hip capsule. The lateral femoral circumflex vessels were identified and coagulated. A capsulotomy was performed and the capsular flaps tagged for later repair.  Fluoroscopy was utilized to prepare for the femoral neck cut. The neck osteotomy was performed. The femoral head was removed, the acetabular rim was cleared of soft tissue and attention was turned to reaming the acetabulum.  Sequential reaming was performed under fluoroscopic guidance. We reamed to a size 53 mm, and then impacted the acetabular shell. The liner was then placed after irrigation and attention turned to the femur.  After placing the femoral hook, the leg was taken to externally rotated, extended and adducted  position taking care to perform soft tissue releases to allow for adequate mobilization of the femur. Soft tissue was cleared from the shoulder of the greater trochanter and the hook elevator used to improve exposure of the proximal femur. Sequential broaching performed up to a size 5. Trial neck and head were placed. The leg was brought back up to neutral and the construct reduced. The position and sizing of components, offset and leg lengths were checked using fluoroscopy. Stability of the  construct was checked in extension and external rotation without any subluxation or impingement of prosthesis. We dislocated the prosthesis, dropped the leg back into position, removed trial components, and irrigated copiously. The final stem and head was then placed, the leg brought back up, the system reduced and fluoroscopy used to verify positioning.  We irrigated, obtained hemostasis and closed the capsule using #2 ethibond suture.  One gram of vancomycin powder was placed in the surgical bed. The fascia was closed with #1 vicryl plus, the deep fat layer was closed with 0 vicryl, the subcutaneous layers closed with 2.0 Vicryl Plus and the skin closed with 3.0 monocryl and steri strips. A sterile dressing was applied. The patient was awakened in the operating room and taken to recovery in stable condition.  All sponge, needle, and instrument counts were correct at the end of the case.   Position: supine  Complications: none.  Time Out: performed   Drains/Packing: none  Estimated blood loss: 150 cc  Returned to Recovery Room: in good condition.   Antibiotics: yes   Mechanical VTE (DVT) Prophylaxis: sequential compression devices, TED thigh-high  Chemical VTE (DVT) Prophylaxis: aspirin   Fluid Replacement: see anesthesia record  Specimens Removed: 1 to pathology  Sponge and Instrument Count Correct? yes   PACU: portable radiograph - low AP   Admission: inpatient status  Plan/RTC: Return in 2  weeks for staple removal. Weight Bearing/Load Lower Extremity: full  Hip precautions: none Suture Removal: 10-14 days  Betadine to incision twice daily once dressing is removed on POD#7  N. Glee Arvin, MD [facility] [phone] 3:13 PM     Implant Name Type Inv. Item Serial No. Manufacturer Lot No. LRB No. Used  ACETABULAR CUP Cathe Mons - [id number] Plate ACETABULAR CUP W GRIPTION  DEPUY SYNTHES [id number] Left 1  LINER NEUTRAL 54X36MM PLUS 4 - [id number] Hips LINER NEUTRAL 54X36MM PLUS 4  DEPUY SYNTHES [id number] Left 1  SCREW 6.5MMX25MM - [id number] Screw SCREW 6.5MMX25MM  DEPUY SYNTHES [id number] Left 1  HEAD CERAMIC BIO DELTA 36 - [id number] Hips HEAD CERAMIC BIO DELTA 36  DEPUY SYNTHES [id number] Left 1  Femoral Stem 12/14 Taper Hip Prosthesis Cementless Size 5 Stem  1010-11-050 DEPUY SYNTHES [id number] Left 1

## 2018-03-10 NOTE — Discharge Instructions (Signed)

## 2018-03-11 ENCOUNTER — Encounter (HOSPITAL_COMMUNITY): Payer: Self-pay | Admitting: Orthopaedic Surgery

## 2018-03-11 ENCOUNTER — Other Ambulatory Visit: Payer: Self-pay

## 2018-03-11 LAB — BASIC METABOLIC PANEL
Anion gap: 7 (ref 5–15)
BUN: 7 mg/dL (ref 6–20)
CHLORIDE: 99 mmol/L — AB (ref 101–111)
CO2: 30 mmol/L (ref 22–32)
CREATININE: 1.04 mg/dL (ref 0.61–1.24)
Calcium: 8.5 mg/dL — ABNORMAL LOW (ref 8.9–10.3)
GFR calc non Af Amer: >60 mL/min (ref >60)
Glucose, Bld: 239 mg/dL — ABNORMAL HIGH (ref 65–99)
POTASSIUM: 3.8 mmol/L (ref 3.5–5.1)
Sodium: 136 mmol/L (ref 135–145)

## 2018-03-11 LAB — CBC
HEMATOCRIT: 27.6 % — AB (ref 39.0–52.0)
Hemoglobin: 9.3 g/dL — ABNORMAL LOW (ref 13.0–17.0)
MCH: 33.6 pg (ref 26.0–34.0)
MCHC: 33.7 g/dL (ref 30.0–36.0)
MCV: 99.6 fL (ref 78.0–100.0)
PLATELETS: 150 10*3/uL (ref 150–400)
RBC: 2.77 MIL/uL — ABNORMAL LOW (ref 4.22–5.81)
RDW: 15.5 % (ref 11.5–15.5)
WBC: 7.4 10*3/uL (ref 4.0–10.5)

## 2018-03-11 NOTE — Clinical Social Work Note (Signed)
Clinical Social Work Assessment  Patient Details  Name: Charles [name] MRN: [id number] Date of Birth: 08-27-1961  Date of referral:  03/11/18               Reason for consult:  Facility Placement, Discharge Planning                Permission sought to share information with:  Facility Sport and exercise psychologist, Family Supports Permission granted to share information::  Yes, Verbal Permission Granted  Name::     Hyland Mollenkopf  Agency::  SNFs  Relationship::  sister  Contact Information:  [phone]  Housing/Transportation Living arrangements for the past 2 months:  Apartment Source of Information:  Patient Patient Interpreter Needed:  None Criminal Activity/Legal Involvement Pertinent to Current Situation/Hospitalization:  No - Comment as needed Significant Relationships:  Siblings Lives with:  Self Do you feel safe going back to the place where you live?  Yes Need for family participation in patient care:  No (Coment)  Care giving concerns: Patient from home. Hip surgery this admission. PT recommending SNF.   Social Worker assessment / plan: CSW met with patient at bedside. Patient alert and oriented. CSW introduced self and role and discussed disposition planning. Patient agreeable to SNF.   Patient reported he is currently connected with [employer] program for housing, though is in the process of applying for social security disability.  CSW sent out initial SNF referrals, awaiting bed offers. CSW to provide bed offers when available and support with discharge.  Employment status:  Disabled (Comment on whether or not currently receiving Disability) Insurance information:  [place] PT Recommendations:  [place] / Referral to community resources:  [place]  Patient/Family's Response to care: Patient appreciative of care.  Patient/Family's Understanding of and Emotional Response to Diagnosis, Current Treatment, and  Prognosis: Patient with understanding of condition and agreeable to SNF.  Emotional Assessment Appearance:  Appears stated age Attitude/Demeanor/Rapport:  Engaged, Gracious Affect (typically observed):  Accepting, Appropriate, Calm, Pleasant Orientation:  Oriented to Self, Oriented to Place, Oriented to  Time, Oriented to Situation Alcohol / Substance use:  Tobacco Use, Alcohol Use Psych involvement (Current and /or in the community):  No (Comment)  Discharge Needs  Concerns to be addressed:  Discharge Planning Concerns, Care Coordination Readmission within the last 30 days:  No Current discharge risk:  Physical Impairment Barriers to Discharge:  No SNF bed   Estanislado Emms, LCSW 03/11/2018, 4:40 PM

## 2018-03-11 NOTE — Plan of Care (Signed)
  Problem: Activity: Goal: Ability to avoid complications of mobility impairment will improve Outcome: Progressing Goal: Ability to tolerate increased activity will improve Outcome: Progressing   Problem: Pain Management: Goal: Pain level will decrease with appropriate interventions Outcome: Progressing   

## 2018-03-11 NOTE — Discharge Summary (Addendum)
Patient ID: Charles [name] MRN: [id number] DOB/AGE: 02-04-1961 57 y.o.  Admit date: 03/10/2018 Discharge date: 03/12/2018  Admission Diagnoses:  Active Problems:   Surgery, elective   History of total hip replacement   Discharge Diagnoses:  Same  Past Medical History:  Diagnosis Date  . 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 COPD (chronic obstructive pulmonary disease) (HCC)   . Gout   . Hepatitis C virus carrier state (HCC)   . Hypertension   . Osteochondrosis   . Primary localized osteoarthritis of hip    Left    Surgeries: Procedure(s): LEFT TOTAL HIP ARTHROPLASTY ANTERIOR APPROACH on 03/10/2018   Consultants:   Discharged Condition: Improved  Hospital Course: Charles [name] is an 57 y.o. male who was admitted 03/10/2018 for operative treatment of left hip avascular necrosis. Patient has severe unremitting pain that affects sleep, daily activities, and work/hobbies. After pre-op clearance the patient was taken to the operating room on 03/10/2018 and underwent  Procedure(s): LEFT TOTAL HIP ARTHROPLASTY ANTERIOR APPROACH.    Patient was given perioperative antibiotics:  Anti-infectives (From admission, onward)   Start     Dose/Rate Route Frequency Ordered Stop   03/10/18 1900  ceFAZolin (ANCEF) IVPB 2g/100 mL premix     2 g 200 mL/hr over 30 Minutes Intravenous Every 6 hours 03/10/18 1859 03/11/18 0649   03/10/18 1506  vancomycin (VANCOCIN) powder  Status:  Discontinued       As needed 03/10/18 1508 03/10/18 1550   03/10/18 1015  ceFAZolin (ANCEF) IVPB 2g/100 mL premix     2 g 200 mL/hr over 30 Minutes Intravenous On call to O.R. 03/10/18 1005 03/10/18 1326       Patient was given sequential compression devices, early ambulation, and chemoprophylaxis to prevent DVT.  Patient benefited maximally from hospital stay and there were no complications.    Recent vital signs:  Patient Vitals for the past 24 hrs:  BP Temp Temp src Pulse Resp SpO2  03/12/18 0603  107/77 98.5 F (36.9 C) Oral 96 18 100 %  03/11/18 1426 112/74 97.9 F (36.6 C) Oral 90 16 98 %     Recent laboratory studies:  Recent Labs    03/10/18 1016 03/11/18 1424  WBC  --  7.4  HGB  --  9.3*  HCT  --  27.6*  PLT  --  150  NA  --  136  K  --  3.8  CL  --  99*  CO2  --  30  BUN  --  7  CREATININE  --  1.04  GLUCOSE  --  239*  INR 0.99  --   CALCIUM  --  8.5*     Discharge Medications:   Allergies as of 03/12/2018   No Known Allergies     Medication List    STOP taking these medications   meloxicam 15 MG tablet Commonly known as:  MOBIC     TAKE these medications   albuterol 108 (90 Base) MCG/ACT inhaler Commonly known as:  PROVENTIL HFA;VENTOLIN HFA Inhale 2 puffs into the lungs every 4 (four) hours as needed for wheezing or shortness of breath (cough, shortness of breath or wheezing.).   allopurinol 100 MG tablet Commonly known as:  ZYLOPRIM Take 100 mg by mouth 2 (two) times daily.   aspirin EC 81 MG tablet Take 1 tablet (81 mg total) by mouth 2 (two) times daily.   budesonide-formoterol 80-4.5 MCG/ACT inhaler Commonly known as:  SYMBICORT Inhale 2 puffs into the lungs 2 (two) times daily.   Glecaprevir-Pibrentasvir 100-40 MG Tabs Commonly known as:  MAVYRET Take 3 tablets by mouth daily with supper.   hydrochlorothiazide 25 MG tablet Commonly known as:  HYDRODIURIL Take 1 tablet (25 mg total) by mouth daily.   losartan 50 MG tablet Commonly known as:  COZAAR Take 1 tablet (50 mg total) by mouth daily.   methocarbamol 750 MG tablet Commonly known as:  ROBAXIN Take 1 tablet (750 mg total) by mouth 2 (two) times daily as needed for muscle spasms.   MITIGARE 0.6 MG Caps Generic drug:  Colchicine Take 0.6 mg by mouth daily as needed (gout flare).   ondansetron 4 MG tablet Commonly known as:  ZOFRAN Take 1-2 tablets (4-8 mg total) by mouth every 8 (eight) hours as needed for nausea or vomiting.   oxyCODONE 10 mg 12 hr tablet Commonly  known as:  OXYCONTIN Take 1 tablet (10 mg total) by mouth every 12 (twelve) hours for 3 days.   oxyCODONE 5 MG immediate release tablet Commonly known as:  Oxy IR/ROXICODONE Take 1-3 tablets (5-15 mg total) by mouth every 4 (four) hours as needed.   promethazine 25 MG tablet Commonly known as:  PHENERGAN Take 1 tablet (25 mg total) by mouth every 6 (six) hours as needed for nausea.   senna-docusate 8.6-50 MG tablet Commonly known as:  SENOKOT S Take 1 tablet by mouth at bedtime as needed.   SUMAtriptan 100 MG tablet Commonly known as:  IMITREX Take 100 mg by mouth every 2 (two) hours as needed for migraine. May repeat in 2 hours if headache persists or recurs.   tamsulosin 0.4 MG Caps capsule Commonly known as:  FLOMAX Take 0.4 mg by mouth daily.            Durable Medical Equipment  (From admission, onward)        Start     Ordered   03/10/18 1900  DME Walker rolling  Once    Question:  Patient needs a walker to treat with the following condition  Answer:  History of hip replacement   03/10/18 1859   03/10/18 1900  DME 3 n 1  Once     03/10/18 1859   03/10/18 1900  DME Bedside commode  Once    Question:  Patient needs a bedside commode to treat with the following condition  Answer:  History of hip replacement   03/10/18 1859      Diagnostic Studies: Dg Pelvis Portable  Result Date: 03/10/2018 CLINICAL DATA:  57 year old male post left hip arthroplasty. Subsequent encounter. EXAM: PORTABLE PELVIS 1-2 VIEWS COMPARISON:  03/10/2018 intraoperative C-arm views. 02/18/2018 plain film. FINDINGS: Post total left hip replacement which appears in satisfactory position without complication noted on frontal projection. Lateral aspect left acetabulum with bony overgrowth appears similar to the preoperative exam. IMPRESSION: Post total left hip replacement without complication noted on frontal projection. Electronically Signed   By: Lacy DuverneySteven  Olson M.D.   On: 03/10/2018 17:55   Dg  C-arm 1-60 Min  Result Date: 03/10/2018 CLINICAL DATA:  Left total hip arthroplasty.  Anterior approach. EXAM: OPERATIVE LEFT HIP (WITH PELVIS IF PERFORMED) 3 VIEWS TECHNIQUE: Fluoroscopic spot image(s) were submitted for interpretation post-operatively. COMPARISON:  Left hip radiographs 08/09/2017 FINDINGS: AP intraoperative views demonstrate left total hip arthroplasty. The femoral head projects over the acetabular component. No associated fracture is present. IMPRESSION: Left total hip arthroplasty without radiographic evidence for complication. Electronically Signed  By: Marin Roberts M.D.   On: 03/10/2018 15:44   Dg Hip Operative Unilat W Or W/o Pelvis Left  Result Date: 03/10/2018 CLINICAL DATA:  Left total hip arthroplasty.  Anterior approach. EXAM: OPERATIVE LEFT HIP (WITH PELVIS IF PERFORMED) 3 VIEWS TECHNIQUE: Fluoroscopic spot image(s) were submitted for interpretation post-operatively. COMPARISON:  Left hip radiographs 08/09/2017 FINDINGS: AP intraoperative views demonstrate left total hip arthroplasty. The femoral head projects over the acetabular component. No associated fracture is present. IMPRESSION: Left total hip arthroplasty without radiographic evidence for complication. Electronically Signed   By: Marin Roberts M.D.   On: 03/10/2018 15:44   Xr Hip Unilat W Or W/o Pelvis 2-3 Views Left  Result Date: 02/18/2018 Advanced AVN with femoral head collapse and degenerative joint disease   Disposition: Discharge disposition: 03-Skilled Nursing Facility         Follow-up Information    Tarry Kos, MD In 2 weeks.   Specialty:  Orthopedic Surgery Why:  For suture removal, For wound re-check Contact information: [address] [place] [address] [phone]            Signed: Cristie Hem 03/12/2018, 7:49 AM

## 2018-03-11 NOTE — Progress Notes (Signed)
Subjective: 1 Day Post-Op Procedure(s) (LRB): LEFT TOTAL HIP ARTHROPLASTY ANTERIOR APPROACH (Left) Patient reports pain as mild.  Doing well this am.    Objective: Vital signs in last 24 hours: Temp:  [97.4 F (36.3 C)-98.8 F (37.1 C)] 98.8 F (37.1 C) (06/04 0523) Pulse Rate:  [68-99] 99 (06/04 0523) Resp:  [14-28] 16 (06/04 0523) BP: (91-163)/(56-97) 91/56 (06/04 0523) SpO2:  [95 %-100 %] 99 % (06/04 0523) Weight:  [126 lb 1.6 oz (57.2 kg)] 126 lb 1.6 oz (57.2 kg) (06/03 1010)  Intake/Output from previous day: 06/03 0701 - 06/04 0700 In: 2622.5 [P.O.:360; I.V.:1562.5; IV Piggyback:700] Out: 1500 [Urine:800; Blood:700] Intake/Output this shift: No intake/output data recorded.  No results for input(s): HGB in the last 72 hours. No results for input(s): WBC, RBC, HCT, PLT in the last 72 hours. No results for input(s): NA, K, CL, CO2, BUN, CREATININE, GLUCOSE, CALCIUM in the last 72 hours. Recent Labs    03/10/18 1016  INR 0.99    Neurologically intact Neurovascular intact Sensation intact distally Intact pulses distally Dorsiflexion/Plantar flexion intact Incision: scant drainage No cellulitis present Compartment soft   Assessment/Plan: 1 Day Post-Op Procedure(s) (LRB): LEFT TOTAL HIP ARTHROPLASTY ANTERIOR APPROACH (Left) Advance diet Up with therapy Discharge to SNF once bed available WBAT LLE-no precautions Awaiting labs    Cristie HemMary L Stanbery 03/11/2018, 7:39 AM

## 2018-03-11 NOTE — [place] FL2 (Signed)
[place] MEDICAID FL2 LEVEL OF CARE SCREENING TOOL     IDENTIFICATION  Patient Name: Charles [name] Birthdate: December 08, 1960 Sex: male Admission Date (Current Location): 03/10/2018  [facility] [place] [facility] [place]   Facility and Address:  [facility], [address], [place], [place] [address]      Provider Number: [id number]  Attending Physician Name and Address:  Tarry KosXu, Naiping M, MD  Relative Name and Phone Number:  Tressia MinersCarlena Davis, sister, [phone]    Current Level of Care: Hospital Recommended Level of Care: Skilled Nursing Facility Prior Approval Number:    Date Approved/Denied:   PASRR Number: [id number]  Discharge Plan: SNF    Current Diagnoses: Patient Active Problem List   Diagnosis Date Noted  . Surgery, elective 03/10/2018  . History of total hip replacement 03/10/2018  . Syphilis 01/01/2018  . Presbyopia 01/01/2018  . Chronic viral hepatitis C (HCC) 12/04/2017  . AVN (avascular necrosis of bone) (HCC) 12/04/2017  . Essential hypertension 07/20/2017  . CHF (congestive heart failure) (HCC) 07/20/2017  . Chronic obstructive pulmonary disease (HCC) 07/20/2017    Orientation RESPIRATION BLADDER Height & Weight     Self, Time, Situation, Place  Normal Continent Weight: 126 lb 1.6 oz (57.2 kg) Height:  6' (182.9 cm)  BEHAVIORAL SYMPTOMS/MOOD NEUROLOGICAL BOWEL NUTRITION STATUS      Continent Diet(please see DC summary)  AMBULATORY STATUS COMMUNICATION OF NEEDS Skin   Extensive Assist Verbally Surgical wounds(surgical wound L hip)                       Personal Care Assistance Level of Assistance  Bathing, Feeding, Dressing Bathing Assistance: Limited assistance Feeding assistance: Independent Dressing Assistance: Limited assistance     Functional Limitations Info  Sight, Hearing, Speech Sight Info: Adequate Hearing Info: Adequate Speech Info: Adequate    SPECIAL CARE FACTORS FREQUENCY  PT (By licensed PT)      PT Frequency: 5x/week              Contractures Contractures Info: Not present    Additional Factors Info  Code Status, Allergies Code Status Info: Full Allergies Info: No Known Allergies           Current Medications (03/11/2018):  This is the current hospital active medication list Current Facility-Administered Medications  Medication Dose Route Frequency Provider Last Rate Last Dose  . acetaminophen (TYLENOL) tablet 325-650 mg  325-650 mg Oral Q6H PRN Tarry KosXu, Naiping M, MD      . albuterol (PROVENTIL) (2.5 MG/3ML) 0.083% nebulizer solution 3 mL  3 mL Inhalation Q4H PRN Tarry KosXu, Naiping M, MD      . allopurinol (ZYLOPRIM) tablet 100 mg  100 mg Oral BID Tarry KosXu, Naiping M, MD   100 mg at 03/11/18 0915  . alum & mag hydroxide-simeth (MAALOX/MYLANTA) 200-200-20 MG/5ML suspension 30 mL  30 mL Oral Q4H PRN Tarry KosXu, Naiping M, MD      . aspirin chewable tablet 81 mg  81 mg Oral BID Tarry KosXu, Naiping M, MD   81 mg at 03/11/18 0915  . diphenhydrAMINE (BENADRYL) 12.5 MG/5ML elixir 25 mg  25 mg Oral Q4H PRN Tarry KosXu, Naiping M, MD   25 mg at 03/11/18 0027  . docusate sodium (COLACE) capsule 100 mg  100 mg Oral BID Tarry KosXu, Naiping M, MD   100 mg at 03/11/18 0914  . gabapentin (NEURONTIN) capsule 300 mg  300 mg Oral TID Tarry KosXu, Naiping M, MD   300 mg at 03/11/18  1512  . HYDROmorphone (DILAUDID) injection 0.5-1 mg  0.5-1 mg Intravenous Q4H PRN Tarry Kos, MD      . losartan (COZAAR) tablet 50 mg  50 mg Oral Daily Tarry Kos, MD   50 mg at 03/11/18 0914  . magnesium citrate solution 1 Bottle  1 Bottle Oral Once PRN Tarry Kos, MD      . menthol-cetylpyridinium (CEPACOL) lozenge 3 mg  1 lozenge Oral PRN Tarry Kos, MD       Or  . phenol (CHLORASEPTIC) mouth spray 1 spray  1 spray Mouth/Throat PRN Tarry Kos, MD      . methocarbamol (ROBAXIN) tablet 500 mg  500 mg Oral Q6H PRN Tarry Kos, MD   500 mg at 03/11/18 1512   Or  . methocarbamol (ROBAXIN) 500 mg in dextrose 5 % 50 mL IVPB  500 mg Intravenous Q6H PRN Tarry Kos, MD      . metoCLOPramide (REGLAN) tablet 5-10 mg  5-10 mg Oral Q8H PRN Tarry Kos, MD       Or  . metoCLOPramide (REGLAN) injection 5-10 mg  5-10 mg Intravenous Q8H PRN Tarry Kos, MD      . ondansetron [facility]) tablet 4 mg  4 mg Oral Q6H PRN Tarry Kos, MD       Or  . ondansetron [facility]) injection 4 mg  4 mg Intravenous Q6H PRN Tarry Kos, MD      . oxyCODONE (Oxy IR/ROXICODONE) immediate release tablet 10-15 mg  10-15 mg Oral Q4H PRN Tarry Kos, MD   15 mg at 03/11/18 1512  . oxyCODONE (Oxy IR/ROXICODONE) immediate release tablet 5-10 mg  5-10 mg Oral Q4H PRN Tarry Kos, MD   5 mg at 03/10/18 1741  . oxyCODONE (OXYCONTIN) 12 hr tablet 10 mg  10 mg Oral Q12H Tarry Kos, MD   10 mg at 03/11/18 0914  . polyethylene glycol (MIRALAX / GLYCOLAX) packet 17 g  17 g Oral Daily PRN Tarry Kos, MD      . sorbitol 70 % solution 30 mL  30 mL Oral Daily PRN Tarry Kos, MD      . SUMAtriptan (IMITREX) tablet 100 mg  100 mg Oral Q2H PRN Tarry Kos, MD      . tamsulosin [facility]) capsule 0.4 mg  0.4 mg Oral Daily Tarry Kos, MD   0.4 mg at 03/11/18 6962     Discharge Medications: Please see discharge summary for a list of discharge medications.  Relevant Imaging Results:  Relevant Lab Results:   Additional Information SSN: [id number]  Abigail Butts, LCSW

## 2018-03-11 NOTE — Evaluation (Signed)
Physical Therapy Evaluation Patient Details Name: Charles [name] MRN: [id number] DOB: [birth year] Today's Date: 03/11/2018   History of Present Illness  57 y.o. male who presents for surgical treatment of left hip degenerative joint disease, avascular necrosis. s/p direct anterior L THA . PMH significant for COPD, HTN, osteochondritis and peripheral neuropathy  Clinical Impression  Pt is s/p TKA resulting in the deficits listed below (see PT Problem List). Pt was limited in safe mobility by decreased strength and ROM in L hip and R knee as well as dizziness with ambulation. Pt is currently mod I for bed mobility, minA for transfers to RW and minA for ambulation of 20 feet with RW. Pt will benefit from skilled PT to increase their independence and safety with mobility to allow discharge to the venue listed below.     Follow Up Recommendations SNF    Equipment Recommendations  Other (comment)(TBD at next venue)    Recommendations for Other Services       Precautions / Restrictions Precautions Precautions: Fall Restrictions Weight Bearing Restrictions: Yes LLE Weight Bearing: Weight bearing as tolerated      Mobility  Bed Mobility Overal bed mobility: Modified Independent             General bed mobility comments: increased time, effort and use of bed rails to pull to EoB  Transfers Overall transfer level: Needs assistance Equipment used: Rolling walker (2 wheeled) Transfers: Sit to/from Stand Sit to Stand: From elevated surface;Min assist         General transfer comment: minA for power up and steadying in RW, slight dizziness with upright that quickly passed  Ambulation/Gait Ambulation/Gait assistance: Min assist Ambulation Distance (Feet): 20 Feet Assistive device: Rolling walker (2 wheeled) Gait Pattern/deviations: Step-through pattern;Decreased weight shift to left;Decreased stance time - left;Decreased step length - right;Trunk flexed;Antalgic Gait velocity:  slowed Gait velocity interpretation: <1.8 ft/sec, indicate of risk for recurrent falls General Gait Details: minA for steadying with gait, mild instability no overt LoB, no buckling, decreased weightshift to L        Balance Overall balance assessment: Needs assistance Sitting-balance support: No upper extremity supported;Feet supported Sitting balance-Leahy Scale: Fair     Standing balance support: Bilateral upper extremity supported Standing balance-Leahy Scale: Poor                               Pertinent Vitals/Pain Pain Assessment: 0-10 Pain Score: 7  Pain Location: L hip, R knee, and coccyx from fall last week  Pain Descriptors / Indicators: Aching;Sore Pain Intervention(s): Limited activity within patient's tolerance;Monitored during session;Repositioned    Home Living Family/patient expects to be discharged to:: Skilled nursing facility Living Arrangements: Non-relatives/Friends                    Prior Function Level of Independence: Independent with assistive device(s)         Comments: ambulates with crutches or no AD, increasing disability        Extremity/Trunk Assessment   Upper Extremity Assessment Upper Extremity Assessment: Overall WFL for tasks assessed    Lower Extremity Assessment Lower Extremity Assessment: RLE deficits/detail;LLE deficits/detail RLE Deficits / Details: R knee swollen and lacking full extension, painful with flexion in non-weightbearing, worse in weightbearing, Hip and Ankle ROM WFL, strength grossly 3+/5  RLE: Unable to fully assess due to pain RLE Sensation: history of peripheral neuropathy LLE Deficits / Details: L THA after AVN and  OA, hip ROM limited by pain, knee and ankle ROM WFL, strength of knee and ankle grossly 3+/5 LLE: Unable to fully assess due to pain LLE Sensation: history of peripheral neuropathy(peripheral neuropathy to ankle )       Communication   Communication: No difficulties   Cognition Arousal/Alertness: Awake/alert Behavior During Therapy: WFL for tasks assessed/performed Overall Cognitive Status: Within Functional Limits for tasks assessed                                           Exercises     Assessment/Plan    PT Assessment Patient needs continued PT services  PT Problem List Decreased strength;Decreased range of motion;Decreased activity tolerance;Decreased balance;Decreased mobility;Decreased coordination;Decreased safety awareness;Pain       PT Treatment Interventions DME instruction;Gait training;Stair training;Functional mobility training;Therapeutic activities;Therapeutic exercise;Balance training;Patient/family education    PT Goals (Current goals can be found in the Care Plan section)  Acute Rehab PT Goals Patient Stated Goal: have less pain with walking PT Goal Formulation: With patient Time For Goal Achievement: 03/25/18 Potential to Achieve Goals: Fair    Frequency 7X/week   Barriers to discharge Decreased caregiver support;Inaccessible home environment lives on 3rd floor with room mate who is only available for intermittent care       AM-PAC PT "6 Clicks" Daily Activity  Outcome Measure Difficulty turning over in bed (including adjusting bedclothes, sheets and blankets)?: A Little Difficulty moving from lying on back to sitting on the side of the bed? : A Little Difficulty sitting down on and standing up from a chair with arms (e.g., wheelchair, bedside commode, etc,.)?: Unable Help needed moving to and from a bed to chair (including a wheelchair)?: A Little Help needed walking in hospital room?: A Little Help needed climbing 3-5 steps with a railing? : A Lot 6 Click Score: 15    End of Session Equipment Utilized During Treatment: Gait belt Activity Tolerance: Patient tolerated treatment well Patient left: in chair;with call bell/phone within reach;with chair alarm set Nurse Communication: Mobility  status PT Visit Diagnosis: Unsteadiness on feet (R26.81);Other abnormalities of gait and mobility (R26.89);Muscle weakness (generalized) (M62.81);Difficulty in walking, not elsewhere classified (R26.2);History of falling (Z91.81);Pain Pain - Right/Left: Left Pain - part of body: Hip    Time: 1610-9604 PT Time Calculation (min) (ACUTE ONLY): 35 min   Charges:   PT Evaluation $PT Eval Low Complexity: 1 Low PT Treatments $Gait Training: 8-22 mins   PT G Codes:        Kaleab Frasier B. Beverely Risen PT, DPT Acute Rehabilitation  [phone] Pager [phone]    Elon Alas Fleet 03/11/2018, 12:55 PM

## 2018-03-12 NOTE — Social Work (Signed)
Clinical Social Worker facilitated patient discharge including contacting patient family and facility to confirm patient discharge plans.  Clinical information faxed to facility and family agreeable with plan.    CSW arranged ambulance transport via [facility] to [facility] .    RN to call [phone] to give report prior to discharge.  Clinical Social Worker will sign off for now as social work intervention is no longer needed. Please consult us again if new need arises.  Pebble Botkin, LCSW Clinical Social Worker [phone]    

## 2018-03-12 NOTE — Social Work (Signed)
CSW met with patient at bedside and discussed SNF offers.  Pt accepted bed offer at [facility]. CSW confirming bed offer with SNF.  Elissa Hefty, LCSW Clinical Social Worker [phone]

## 2018-03-12 NOTE — Progress Notes (Signed)
Physical Therapy Treatment Patient Details Name: Charles [name] MRN: [id number] DOB: [birth year] Today's Date: 03/12/2018    History of Present Illness 57 y.o. male who presents for surgical treatment of left hip degenerative joint disease, avascular necrosis. s/p direct anterior L THA . PMH significant for COPD, HTN, osteochondritis and peripheral neuropathy    PT Comments    Patient is progressing well towards their physical therapy goals, however, does require increased encouragement to participate. Presents with increased impulsivity this session and refused to use walker for mobility. Requiring min assist to steady during ambulating 30 feet with no assistive device. Could benefit from gait training using SPC. Rest of session focused on proximal lower extremity strengthening. Plan for discharge to SNF today.    Follow Up Recommendations  SNF     Equipment Recommendations  Other (comment)(TBD at next venue)    Recommendations for Other Services       Precautions / Restrictions Precautions Precautions: Fall Restrictions Weight Bearing Restrictions: Yes LLE Weight Bearing: Weight bearing as tolerated    Mobility  Bed Mobility Overal bed mobility: Independent                Transfers Overall transfer level: Needs assistance Equipment used: Rolling walker (2 wheeled) Transfers: Sit to/from Stand Sit to Stand: Min guard         General transfer comment: min guard for safety  Ambulation/Gait Ambulation/Gait assistance: Min assist Ambulation Distance (Feet): 30 Feet Assistive device: None Gait Pattern/deviations: Decreased weight shift to left;Decreased stance time - left;Decreased step length - right;Trunk flexed;Antalgic;Step-through pattern Gait velocity: slowed   General Gait Details: Patient is very impulsive and refused to use RW for balance, requiring min assist to steady.    Stairs             Wheelchair Mobility    Modified Rankin (Stroke  Patients Only)       Balance Overall balance assessment: Needs assistance Sitting-balance support: No upper extremity supported;Feet supported Sitting balance-Leahy Scale: Good     Standing balance support: No upper extremity supported;During functional activity Standing balance-Leahy Scale: Fair                              Cognition Arousal/Alertness: Awake/alert Behavior During Therapy: Impulsive Overall Cognitive Status: Within Functional Limits for tasks assessed                                        Exercises Total Joint Exercises Heel Slides: 15 reps;Left;Supine Hip ABduction/ADduction: 10 reps;Left;Supine Straight Leg Raises: 10 reps;Left;Supine    General Comments        Pertinent Vitals/Pain Pain Assessment: Faces Faces Pain Scale: Hurts a little bit Pain Location: L hip Pain Descriptors / Indicators: Aching;Sore Pain Intervention(s): Monitored during session    Home Living                      Prior Function            PT Goals (current goals can now be found in the care plan section) Acute Rehab PT Goals Patient Stated Goal: have less pain with walking PT Goal Formulation: With patient Time For Goal Achievement: 03/25/18 Potential to Achieve Goals: Fair Progress towards PT goals: Progressing toward goals    Frequency    7X/week      PT Plan  Current plan remains appropriate    Co-evaluation              AM-PAC PT "6 Clicks" Daily Activity  Outcome Measure  Difficulty turning over in bed (including adjusting bedclothes, sheets and blankets)?: None Difficulty moving from lying on back to sitting on the side of the bed? : None Difficulty sitting down on and standing up from a chair with arms (e.g., wheelchair, bedside commode, etc,.)?: A Little Help needed moving to and from a bed to chair (including a wheelchair)?: A Little Help needed walking in hospital room?: A Little Help needed climbing  3-5 steps with a railing? : A Little 6 Click Score: 20    End of Session Equipment Utilized During Treatment: Gait belt Activity Tolerance: Patient tolerated treatment well Patient left: with call bell/phone within reach;in bed Nurse Communication: Mobility status PT Visit Diagnosis: Unsteadiness on feet (R26.81);Other abnormalities of gait and mobility (R26.89);Muscle weakness (generalized) (M62.81);Difficulty in walking, not elsewhere classified (R26.2);History of falling (Z91.81);Pain Pain - Right/Left: Left Pain - part of body: Hip     Time: 1352-1400 PT Time Calculation (min) (ACUTE ONLY): 8 min  Charges:  $Therapeutic Exercise: 8-22 mins                    G Codes:      Laurina Bustle, PT, DPT Acute Rehabilitation Services  Pager: [phone]    JEDRICK HUTCHERSON 03/12/2018, 2:10 PM

## 2018-03-12 NOTE — Progress Notes (Signed)
RN [facility] to [facility]. Secretary transferred twice and no one answered. Will attempt again later.

## 2018-03-12 NOTE — Progress Notes (Signed)
Subjective: 2 Days Post-Op Procedure(s) (LRB): LEFT TOTAL HIP ARTHROPLASTY ANTERIOR APPROACH (Left) Patient reports pain as mild.  Still sore to left hip, otherwise doing well.  Objective: Vital signs in last 24 hours: Temp:  [97.9 F (36.6 C)-98.5 F (36.9 C)] 98.5 F (36.9 C) (06/05 0603) Pulse Rate:  [90-96] 96 (06/05 0603) Resp:  [16-18] 18 (06/05 0603) BP: (107-112)/(74-77) 107/77 (06/05 0603) SpO2:  [98 %-100 %] 100 % (06/05 0603)  Intake/Output from previous day: 06/04 0701 - 06/05 0700 In: 1582.5 [P.O.:720; I.V.:862.5] Out: 800 [Urine:800] Intake/Output this shift: No intake/output data recorded.  Recent Labs    03/11/18 1424  HGB 9.3*   Recent Labs    03/11/18 1424  WBC 7.4  RBC 2.77*  HCT 27.6*  PLT 150   Recent Labs    03/11/18 1424  NA 136  K 3.8  CL 99*  CO2 30  BUN 7  CREATININE 1.04  GLUCOSE 239*  CALCIUM 8.5*   Recent Labs    03/10/18 1016  INR 0.99    Neurologically intact Neurovascular intact Sensation intact distally Intact pulses distally Dorsiflexion/Plantar flexion intact Incision: scant drainage No cellulitis present Compartment soft    Assessment/Plan: 2 Days Post-Op Procedure(s) (LRB): LEFT TOTAL HIP ARTHROPLASTY ANTERIOR APPROACH (Left) Advance diet Up with therapy Discharge to SNF once bed available WBAT LLE-no precautions     Cristie HemMary L Stanbery 03/12/2018, 7:48 AM

## 2018-03-12 NOTE — Clinical Social Work Placement (Signed)
   CLINICAL SOCIAL WORK PLACEMENT  NOTE  Date:  03/12/2018  Patient Details  Name: Charles [name] MRN: [id number] Date of Birth: 12-12-1960  Clinical Social Work is seeking post-discharge placement for this patient at the Skilled  Nursing Facility level of care (*CSW will initial, date and re-position this form in  chart as items are completed):  Yes   Patient/family provided with [facility] Clinical Social Work Department's list of facilities offering this level of care within the geographic area requested by the patient (or if unable, by the patient's family).  Yes   Patient/family informed of their freedom to choose among providers that offer the needed level of care, that participate in Medicare, Medicaid or managed care program needed by the patient, have an available bed and are willing to accept the patient.  Yes   Patient/family informed of [facility]'s ownership interest in [facility] and [facility], as well as of the fact that they are under no obligation to receive care at these facilities.  PASRR submitted to EDS on       PASRR number received on 03/11/18     Existing PASRR number confirmed on       FL2 transmitted to all facilities in geographic area requested by pt/family on 03/11/18     FL2 transmitted to all facilities within larger geographic area on       Patient informed that his/her managed care company has contracts with or will negotiate with certain facilities, including the following:        Yes   Patient/family informed of bed offers received.  [facility]     Physician recommends and patient chooses bed at      [facility].  Patient to be transferred to facility by [facility]     Patient family notified on 03/12/18 of transfer.  Name of family member notified:  pt responsible for self     PHYSICIAN       Additional Comment:     _______________________________________________ Charles MoorePatricia V Jovanny Stephanie, LCSW 03/12/2018, 10:58 AM

## 2018-03-12 NOTE — Social Work (Signed)
CSW f/u on disposition.  CSW f/u on SNF offers.  Pt payor is medicaid and many SNF's do not accept.  Keene BreathPatricia Shakima Nisley, LCSW Clinical Social Worker [phone]

## 2018-03-14 ENCOUNTER — Encounter (HOSPITAL_COMMUNITY): Payer: Self-pay | Admitting: Orthopaedic Surgery

## 2018-03-14 NOTE — Anesthesia Postprocedure Evaluation (Signed)
Anesthesia Post Note  Patient: Charles [name]  Procedure(s) Performed: LEFT TOTAL HIP ARTHROPLASTY ANTERIOR APPROACH (Left Hip)     Patient location during evaluation: PACU Anesthesia Type: Spinal and MAC Level of consciousness: awake and alert Pain management: pain level controlled Vital Signs Assessment: post-procedure vital signs reviewed and stable Respiratory status: spontaneous breathing, nonlabored ventilation, respiratory function stable and patient connected to nasal cannula oxygen Cardiovascular status: stable and blood pressure returned to baseline Postop Assessment: no apparent nausea or vomiting and spinal receding Anesthetic complications: no    Last Vitals:  Vitals:   03/11/18 1426 03/12/18 0603  BP: 112/74 107/77  Pulse: 90 96  Resp: 16 18  Temp: 36.6 C 36.9 C  SpO2: 98% 100%    Last Pain:  Vitals:   03/12/18 1447  TempSrc:   PainSc: Asleep                 Veronnica Hennings

## 2018-03-25 ENCOUNTER — Inpatient Hospital Stay (INDEPENDENT_AMBULATORY_CARE_PROVIDER_SITE_OTHER): Payer: Medicaid Other | Admitting: Orthopaedic Surgery

## 2018-03-27 ENCOUNTER — Ambulatory Visit (INDEPENDENT_AMBULATORY_CARE_PROVIDER_SITE_OTHER): Payer: Medicaid Other | Admitting: Orthopaedic Surgery

## 2018-03-27 DIAGNOSIS — M87052 Idiopathic aseptic necrosis of left femur: Secondary | ICD-10-CM

## 2018-03-27 DIAGNOSIS — M1711 Unilateral primary osteoarthritis, right knee: Secondary | ICD-10-CM | POA: Diagnosis not present

## 2018-03-27 NOTE — Progress Notes (Signed)
Charles [name] is 2-week status post left total hip replacement.  Overall he is doing well.  He is still at the rehab facility.  His surgical incision has healed without any evidence of infection.  He is progressing with physical therapy although slightly slower than I would like.  I encouraged him today to push himself with physical therapy a little bit more so that he can get the full benefit from the hip replacement.  In terms of his right knee it is fairly arthritic and we will provide him with a knee brace today to hopefully give him some support.  Eventually I do think that he will need a knee replacement but we need him to fully recover from hip replacement first.  Continue to take aspirin 81 mg twice a day until the next follow-up.[name].  He will need standing AP pelvis and lateral left hip on return.

## 2018-04-02 ENCOUNTER — Ambulatory Visit: Payer: Medicaid Other | Admitting: Infectious Diseases

## 2018-04-29 ENCOUNTER — Ambulatory Visit (INDEPENDENT_AMBULATORY_CARE_PROVIDER_SITE_OTHER): Payer: Medicaid Other

## 2018-04-29 ENCOUNTER — Ambulatory Visit (INDEPENDENT_AMBULATORY_CARE_PROVIDER_SITE_OTHER): Payer: Medicaid Other | Admitting: Orthopaedic Surgery

## 2018-04-29 DIAGNOSIS — M1711 Unilateral primary osteoarthritis, right knee: Secondary | ICD-10-CM

## 2018-04-29 DIAGNOSIS — Z96642 Presence of left artificial hip joint: Secondary | ICD-10-CM | POA: Diagnosis not present

## 2018-04-29 DIAGNOSIS — M25561 Pain in right knee: Secondary | ICD-10-CM

## 2018-04-29 DIAGNOSIS — G8929 Other chronic pain: Secondary | ICD-10-CM | POA: Insufficient documentation

## 2018-04-29 MED ORDER — TRAMADOL HCL 50 MG PO TABS: 50.0000 mg | ORAL_TABLET | Freq: Three times a day (TID) | ORAL | 0 refills | Status: DC | PRN

## 2018-04-29 NOTE — Progress Notes (Signed)
Office Visit Note   Patient: Charles [name]           Date of Birth: 03/02/1961           MRN: [id number] Visit Date: 04/29/2018              Requested by: Kallie LocksStroud, Natalie M, FNP [address] [place], [place] [id number] PCP: Kallie LocksStroud, Natalie M, FNP   Assessment & Plan: Visit Diagnoses:  1. Status post hip replacement, left   2. Primary osteoarthritis of right knee     Plan: Impression is 7 weeks status post left total hip replacement and right knee degenerative joint disease.  From a hip standpoint he is doing very well he is very pleased with his interpreter as the right knee he would like to schedule a total knee replacement at this point since he has failed conservative treatment.  He is ambulating with a cane.  Prescription for tramadol provided today.  Risks and benefits and postoperative rehab and recovery regarding the right knee replacement was discussed today.  Patient is in agreement and wishes to proceed  With scheduling.  Follow-Up Instructions: Return in about 6 weeks (around 06/10/2018).   Orders:  Orders Placed This Encounter  Procedures  . XR HIP UNILAT W OR W/O PELVIS 2-3 VIEWS LEFT  . XR Knee Complete 4 Views Right   Meds ordered this encounter  Medications  . traMADol (ULTRAM) 50 MG tablet    Sig: Take 1 tablet (50 mg total) by mouth 3 (three) times daily as needed.    Dispense:  30 tablet    Refill:  0      Procedures: No procedures performed   Clinical Data: No additional findings.   Subjective: Chief Complaint  Patient presents with  . Left Hip - Routine Post Op    03/10/18  Left total hip arthroplasty, anterior approach    Charles [name] is a proximal weeks status post left total hip replacement.  He is doing well overall.  He has noticed a significant improvement in terms of his pain.  He is right knee pain that is severe.  He is currently not able to work due to the pain.  He denies any constitutional symptoms.   Review of Systems  Constitutional:  Negative.   All other systems reviewed and are negative.    Objective: Vital Signs: There were no vitals taken for this visit.  Physical Exam  Constitutional: He is oriented to person, place, and time. He appears well-developed and well-nourished.  Pulmonary/Chest: Effort normal.  Abdominal: Soft.  Neurological: He is alert and oriented to person, place, and time.  Skin: Skin is warm.  Psychiatric: He has a normal mood and affect. His behavior is normal. Judgment and thought content normal.  Nursing note and vitals reviewed.   Ortho Exam Left hip exam shows a fully healed surgical scar.  Painless rotation of the hip.  Leg lengths are equal.  Right knee exam shows no joint effusion.  Collaterals and cruciates are stable.  Medial joint line tenderness. Specialty Comments:  No specialty comments available.  Imaging: Xr Hip Unilat W Or W/o Pelvis 2-3 Views Left  Result Date: 04/29/2018 Stable left total hip replacement in good alignment.  Xr Knee Complete 4 Views Right  Result Date: 04/29/2018 Advanced degenerative joint disease right knee with questionable osteonecrosis of the medial femoral condyle and secondary degenerative disease.    PMFS History: Patient Active Problem List   Diagnosis Date Noted  . Chronic  pain of right knee 04/29/2018  . Surgery, elective 03/10/2018  . Status post hip replacement, left 03/10/2018  . Syphilis 01/01/2018  . Presbyopia 01/01/2018  . Chronic viral hepatitis C (HCC) 12/04/2017  . AVN (avascular necrosis of bone) (HCC) 12/04/2017  . Essential hypertension 07/20/2017  . CHF (congestive heart failure) (HCC) 07/20/2017  . Chronic obstructive pulmonary disease (HCC) 07/20/2017   Past Medical History:  Diagnosis Date  . 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 COPD (chronic obstructive pulmonary disease) (HCC)   . Gout   . Hepatitis C virus carrier state (HCC)   . Hypertension   . Osteochondrosis   . Primary localized  osteoarthritis of hip    Left    Family History  Problem Relation Age of Onset  . Hypertension Mother   . Hypertension Sister        twin  . Seizures Brother   . Schizophrenia Sister     Past Surgical History:  Procedure Laterality Date  . TOTAL HIP ARTHROPLASTY Left 03/10/2018   Procedure: LEFT TOTAL HIP ARTHROPLASTY ANTERIOR APPROACH;  Surgeon: Tarry Kos, MD;  Location: [facility] OR;  Service: Orthopedics;  Laterality: Left;   Social History   Occupational History  . Not on file  Tobacco Use  . Smoking status: Current Every Day Smoker    Packs/day: 0.20  . Smokeless tobacco: Never Used  Substance and Sexual Activity  . Alcohol use: Yes    Alcohol/week: 4.2 oz    Types: 7 Cans of beer per week    Comment: OCCASIONAL  . Drug use: Yes    Types: Marijuana    Comment: 03/10/18 last smoked "a couple of weeks ago"  . Sexual activity: Never

## 2018-05-14 ENCOUNTER — Ambulatory Visit (INDEPENDENT_AMBULATORY_CARE_PROVIDER_SITE_OTHER): Payer: Medicaid Other | Admitting: Family Medicine

## 2018-05-14 ENCOUNTER — Encounter: Payer: Self-pay | Admitting: Family Medicine

## 2018-05-14 VITALS — BP 118/72 | HR 80 | Temp 98.4°F | Ht 72.0 in | Wt 148.0 lb

## 2018-05-14 DIAGNOSIS — F17200 Nicotine dependence, unspecified, uncomplicated: Secondary | ICD-10-CM

## 2018-05-14 DIAGNOSIS — H538 Other visual disturbances: Secondary | ICD-10-CM

## 2018-05-14 DIAGNOSIS — I10 Essential (primary) hypertension: Secondary | ICD-10-CM

## 2018-05-14 DIAGNOSIS — Z9889 Other specified postprocedural states: Secondary | ICD-10-CM | POA: Diagnosis not present

## 2018-05-14 DIAGNOSIS — B182 Chronic viral hepatitis C: Secondary | ICD-10-CM

## 2018-05-14 DIAGNOSIS — Z131 Encounter for screening for diabetes mellitus: Secondary | ICD-10-CM

## 2018-05-14 DIAGNOSIS — J449 Chronic obstructive pulmonary disease, unspecified: Secondary | ICD-10-CM

## 2018-05-14 DIAGNOSIS — G4700 Insomnia, unspecified: Secondary | ICD-10-CM

## 2018-05-14 DIAGNOSIS — Z09 Encounter for follow-up examination after completed treatment for conditions other than malignant neoplasm: Secondary | ICD-10-CM

## 2018-05-14 DIAGNOSIS — Z716 Tobacco abuse counseling: Secondary | ICD-10-CM

## 2018-05-14 DIAGNOSIS — Z889 Allergy status to unspecified drugs, medicaments and biological substances status: Secondary | ICD-10-CM

## 2018-05-14 DIAGNOSIS — M109 Gout, unspecified: Secondary | ICD-10-CM

## 2018-05-14 LAB — POCT GLYCOSYLATED HEMOGLOBIN (HGB A1C): Hemoglobin A1C: 5.5 % (ref 4.0–5.6)

## 2018-05-14 LAB — POCT URINALYSIS DIP (MANUAL ENTRY)
Bilirubin, UA: NEGATIVE
Blood, UA: NEGATIVE
Glucose, UA: NEGATIVE mg/dL
Ketones, POC UA: NEGATIVE mg/dL
Leukocytes, UA: NEGATIVE
Nitrite, UA: NEGATIVE
Protein Ur, POC: NEGATIVE mg/dL
Spec Grav, UA: 1.02 (ref 1.010–1.025)
Urobilinogen, UA: 0.2 E.U./dL
pH, UA: 5.5 (ref 5.0–8.0)

## 2018-05-14 MED ORDER — KETOCONAZOLE 2 % EX CREA: 2.0000 "application " | TOPICAL_CREAM | Freq: Two times a day (BID) | CUTANEOUS | 3 refills | Status: AC

## 2018-05-14 MED ORDER — TRAZODONE HCL 50 MG PO TABS: 25.0000 mg | ORAL_TABLET | Freq: Every evening | ORAL | 3 refills | Status: DC | PRN

## 2018-05-14 MED ORDER — CETIRIZINE HCL 10 MG PO TABS: 10.0000 mg | ORAL_TABLET | Freq: Every day | ORAL | 11 refills | Status: DC

## 2018-05-14 MED ORDER — OMEPRAZOLE 40 MG PO CPDR: 40.0000 mg | DELAYED_RELEASE_CAPSULE | Freq: Every day | ORAL | 3 refills | Status: DC

## 2018-05-14 MED ORDER — ALBUTEROL SULFATE HFA 108 (90 BASE) MCG/ACT IN AERS: 2.0000 | INHALATION_SPRAY | RESPIRATORY_TRACT | 3 refills | Status: DC | PRN

## 2018-05-14 MED ORDER — BUDESONIDE-FORMOTEROL FUMARATE 80-4.5 MCG/ACT IN AERO: 2.0000 | INHALATION_SPRAY | Freq: Two times a day (BID) | RESPIRATORY_TRACT | 11 refills | Status: DC

## 2018-05-14 MED ORDER — METHOCARBAMOL 750 MG PO TABS: 750.0000 mg | ORAL_TABLET | Freq: Two times a day (BID) | ORAL | 3 refills | Status: DC | PRN

## 2018-05-14 MED ORDER — LOSARTAN POTASSIUM 50 MG PO TABS: 50.0000 mg | ORAL_TABLET | Freq: Every day | ORAL | 3 refills | Status: DC

## 2018-05-14 MED ORDER — SENNOSIDES-DOCUSATE SODIUM 8.6-50 MG PO TABS: 1.0000 | ORAL_TABLET | Freq: Every evening | ORAL | 3 refills | Status: DC | PRN

## 2018-05-14 MED ORDER — ASPIRIN EC 81 MG PO TBEC: 81.0000 mg | DELAYED_RELEASE_TABLET | Freq: Two times a day (BID) | ORAL | 3 refills | Status: DC

## 2018-05-14 MED ORDER — TAMSULOSIN HCL 0.4 MG PO CAPS: 0.4000 mg | ORAL_CAPSULE | Freq: Every day | ORAL | 3 refills | Status: DC

## 2018-05-14 MED ORDER — ALLOPURINOL 100 MG PO TABS: 100.0000 mg | ORAL_TABLET | Freq: Two times a day (BID) | ORAL | 3 refills | Status: DC

## 2018-05-14 MED ORDER — COLCHICINE 0.6 MG PO CAPS: 0.6000 mg | ORAL_CAPSULE | Freq: Every day | ORAL | 3 refills | Status: DC | PRN

## 2018-05-14 MED ORDER — HYDROCHLOROTHIAZIDE 25 MG PO TABS: 25.0000 mg | ORAL_TABLET | Freq: Every day | ORAL | 3 refills | Status: DC

## 2018-05-14 NOTE — Patient Instructions (Addendum)
Trazodone tablets What is this medicine? TRAZODONE (TRAZ oh done) is used to treat depression. This medicine may be used for other purposes; ask your health care provider or pharmacist if you have questions. COMMON BRAND NAME(S): Desyrel What should I tell my health care provider before I take this medicine? They need to know if you have any of these conditions: -attempted suicide or thinking about it -bipolar disorder -bleeding problems -glaucoma -heart disease, or previous heart attack -irregular heart beat -kidney or liver disease -low levels of sodium in the blood -an unusual or allergic reaction to trazodone, other medicines, foods, dyes or preservatives -pregnant or trying to get pregnant -breast-feeding How should I use this medicine? Take this medicine by mouth with a glass of water. Follow the directions on the prescription label. Take this medicine shortly after a meal or a light snack. Take your medicine at regular intervals. Do not take your medicine more often than directed. Do not stop taking this medicine suddenly except upon the advice of your doctor. Stopping this medicine too quickly may cause serious side effects or your condition may worsen. A special MedGuide will be given to you by the pharmacist with each prescription and refill. Be sure to read this information carefully each time. Talk to your pediatrician regarding the use of this medicine in children. Special care may be needed. Overdosage: If you think you have taken too much of this medicine contact a poison control center or emergency room at once. NOTE: This medicine is only for you. Do not share this medicine with others. What if I miss a dose? If you miss a dose, take it as soon as you can. If it is almost time for your next dose, take only that dose. Do not take double or extra doses. What may interact with this medicine? Do not take this medicine with any of the following medications: -certain medicines  for fungal infections like fluconazole, itraconazole, ketoconazole, posaconazole, voriconazole -cisapride -dofetilide -dronedarone -linezolid -MAOIs like Carbex, Eldepryl, Marplan, Nardil, and Parnate -mesoridazine -methylene blue (injected into a vein) -pimozide -saquinavir -thioridazine -ziprasidone This medicine may also interact with the following medications: -alcohol -antiviral medicines for HIV or AIDS -aspirin and aspirin-like medicines -barbiturates like phenobarbital -certain medicines for blood pressure, heart disease, irregular heart beat -certain medicines for depression, anxiety, or psychotic disturbances -certain medicines for migraine headache like almotriptan, eletriptan, frovatriptan, naratriptan, rizatriptan, sumatriptan, zolmitriptan -certain medicines for seizures like carbamazepine and phenytoin -certain medicines for sleep -certain medicines that treat or prevent blood clots like dalteparin, enoxaparin, warfarin -digoxin -fentanyl -lithium -NSAIDS, medicines for pain and inflammation, like ibuprofen or naproxen -other medicines that prolong the QT interval (cause an abnormal heart rhythm) -rasagiline -supplements like St. John's wort, kava kava, valerian -tramadol -tryptophan This list may not describe all possible interactions. Give your health care provider a list of all the medicines, herbs, non-prescription drugs, or dietary supplements you use. Also tell them if you smoke, drink alcohol, or use illegal drugs. Some items may interact with your medicine. What should I watch for while using this medicine? Tell your doctor if your symptoms do not get better or if they get worse. Visit your doctor or health care professional for regular checks on your progress. Because it may take several weeks to see the full effects of this medicine, it is important to continue your treatment as prescribed by your doctor. Patients and their families should watch out for new  or worsening thoughts of suicide or depression. Also   watch out for sudden changes in feelings such as feeling anxious, agitated, panicky, irritable, hostile, aggressive, impulsive, severely restless, overly excited and hyperactive, or not being able to sleep. If this happens, especially at the beginning of treatment or after a change in dose, call your health care professional. Bonita QuinYou may get drowsy or dizzy. Do not drive, use machinery, or do anything that needs mental alertness until you know how this medicine affects you. Do not stand or sit up quickly, especially if you are an older patient. This reduces the risk of dizzy or fainting spells. Alcohol may interfere with the effect of this medicine. Avoid alcoholic drinks. This medicine may cause dry eyes and blurred vision. If you wear contact lenses you may feel some discomfort. Lubricating drops may help. See your eye doctor if the problem does not go away or is severe. Your mouth may get dry. Chewing sugarless gum, sucking hard candy and drinking plenty of water may help. Contact your doctor if the problem does not go away or is severe. What side effects may I notice from receiving this medicine? Side effects that you should report to your doctor or health care professional as soon as possible: -allergic reactions like skin rash, itching or hives, swelling of the face, lips, or tongue -elevated mood, decreased need for sleep, racing thoughts, impulsive behavior -confusion -fast, irregular heartbeat -feeling faint or lightheaded, falls -feeling agitated, angry, or irritable -loss of balance or coordination -painful or prolonged erections -restlessness, pacing, inability to keep still -suicidal thoughts or other mood changes -tremors -trouble sleeping -seizures -unusual bleeding or bruising Side effects that usually do not require medical attention (report to your doctor or health care professional if they continue or are bothersome): -change in  sex drive or performance -change in appetite or weight -constipation -headache -muscle aches or pains -nausea This list may not describe all possible side effects. Call your doctor for medical advice about side effects. You may report side effects to FDA at 1-800-FDA-1088. Where should I keep my medicine? Keep out of the reach of children. Store at room temperature between 15 and 30 degrees C (59 to 86 degrees F). Protect from light. Keep container tightly closed. Throw away any unused medicine after the expiration date. NOTE: This sheet is a summary. It may not cover all possible information. If you have questions about this medicine, talk to your doctor, pharmacist, or health care provider.  2018 Elsevier/Gold Standard (2016-02-23 16:57:05) Cetirizine tablets What is this medicine? CETIRIZINE (se TI ra zeen) is an antihistamine. This medicine is used to treat or prevent symptoms of allergies. It is also used to help reduce itchy skin rash and hives. This medicine may be used for other purposes; ask your health care provider or pharmacist if you have questions. COMMON BRAND NAME(S): All Day Allergy, Zyrtec, Zyrtec Hives Relief What should I tell my health care provider before I take this medicine? They need to know if you have any of these conditions: -kidney disease -liver disease -an unusual or allergic reaction to cetirizine, hydroxyzine, other medicines, foods, dyes, or preservatives -pregnant or trying to get pregnant -breast-feeding How should I use this medicine? Take this medicine by mouth with a glass of water. Follow the directions on the prescription label. You can take this medicine with food or on an empty stomach. Take your medicine at regular times. Do not take more often than directed. You may need to take this medicine for several days before your symptoms improve. Talk to  your pediatrician regarding the use of this medicine in children. Special care may be needed. While  this drug may be prescribed for children as young as 44 years of age for selected conditions, precautions do apply. Overdosage: If you think you have taken too much of this medicine contact a poison control center or emergency room at once. NOTE: This medicine is only for you. Do not share this medicine with others. What if I miss a dose? If you miss a dose, take it as soon as you can. If it is almost time for your next dose, take only that dose. Do not take double or extra doses. What may interact with this medicine? -alcohol -certain medicines for anxiety or sleep -narcotic medicines for pain -other medicines for colds or allergies This list may not describe all possible interactions. Give your health care provider a list of all the medicines, herbs, non-prescription drugs, or dietary supplements you use. Also tell them if you smoke, drink alcohol, or use illegal drugs. Some items may interact with your medicine. What should I watch for while using this medicine? Visit your doctor or health care professional for regular checks on your health. Tell your doctor if your symptoms do not improve. You may get drowsy or dizzy. Do not drive, use machinery, or do anything that needs mental alertness until you know how this medicine affects you. Do not stand or sit up quickly, especially if you are an older patient. This reduces the risk of dizzy or fainting spells. Your mouth may get dry. Chewing sugarless gum or sucking hard candy, and drinking plenty of water may help. Contact your doctor if the problem does not go away or is severe. What side effects may I notice from receiving this medicine? Side effects that you should report to your doctor or health care professional as soon as possible: -allergic reactions like skin rash, itching or hives, swelling of the face, lips, or tongue -changes in vision or hearing -fast or irregular heartbeat -trouble passing urine or change in the amount of urine Side  effects that usually do not require medical attention (report to your doctor or health care professional if they continue or are bothersome): -dizziness -dry mouth -irritability -sore throat -stomach pain -tiredness This list may not describe all possible side effects. Call your doctor for medical advice about side effects. You may report side effects to FDA at 1-800-FDA-1088. Where should I keep my medicine? Keep out of the reach of children. Store at room temperature between 15 and 30 degrees C (59 and 86 degrees F). Throw away any unused medicine after the expiration date. NOTE: This sheet is a summary. It may not cover all possible information. If you have questions about this medicine, talk to your doctor, pharmacist, or health care provider.  2018 Elsevier/Gold Standard (2014-10-19 13:44:42)

## 2018-05-14 NOTE — Progress Notes (Signed)
Follow Up  Subjective:    Patient ID: Charles [name], male    DOB: 12-25-1960, 57 y.o.   MRN: [id number]   Chief Complaint  Patient presents with  . Follow-up    3 month chronic condition  . Headache    HPI  Charles [name] has a past medical history of Left Hip Surgery, Osteochondrosis, Hypertension, Hepatitis C Virus, Gout, and COPD. He is here today for follow up.   Current Status: Since her last office visit, he is doing well, but has c/o allergies and scant nose bleeds in the mornings. He is s/p left hip surgery, which he has completed rehab. He has upcoming right knee surgery. He has occasional shortness of breath and cough, which he r/t his asthma and smoking.   He denies fevers, chills, fatigue, recent infections, weight loss, and night sweats. He has not had any headaches, visual changes, dizziness, and falls. No chest pain, heart palpitations, cough and shortness of breath reported. No reports of GI problems such as nausea, vomiting, diarrhea, and constipation. He has no reports of blood in stools, dysuria and hematuria. No depression or anxiety, and denies suicidal ideations, homicidal ideations, or auditory hallucinations. He denies pain today.                     Past Medical History:  Diagnosis Date  . 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 COPD (chronic obstructive pulmonary disease) (HCC)   . Gout   . Hepatitis C virus carrier state (HCC)   . Hypertension   . Osteochondrosis   . Primary localized osteoarthritis of hip    Left    Family History  Problem Relation Age of Onset  . Hypertension Mother   . Hypertension Sister        twin  . Seizures Brother   . Schizophrenia Sister     Social History   Socioeconomic History  . Marital status: Single    Spouse name: Not on file  . Number of children: Not on file  . Years of education: Not on file  . Highest education level: Not on file  Occupational History  . Not on file  Social Needs  . Financial  resource strain: Not on file  . Food insecurity:    Worry: Not on file    Inability: Not on file  . Transportation needs:    Medical: Not on file    Non-medical: Not on file  Tobacco Use  . Smoking status: Current Every Day Smoker    Packs/day: 0.20  . Smokeless tobacco: Never Used  Substance and Sexual Activity  . Alcohol use: Yes    Alcohol/week: 4.2 oz    Types: 7 Cans of beer per week    Comment: OCCASIONAL  . Drug use: Yes    Types: Marijuana    Comment: 03/10/18 last smoked "a couple of weeks ago"  . Sexual activity: Never  Lifestyle  . Physical activity:    Days per week: Not on file    Minutes per session: Not on file  . Stress: Not on file  Relationships  . Social connections:    Talks on phone: Not on file    Gets together: Not on file    Attends religious service: Not on file    Active member of club or organization: Not on file    Attends meetings of clubs or organizations: Not on file    Relationship status: Not on  file  . Intimate partner violence:    Fear of current or ex partner: Not on file    Emotionally abused: Not on file    Physically abused: Not on file    Forced sexual activity: Not on file  Other Topics Concern  . Not on file  Social History Narrative  . Not on file    Past Surgical History:  Procedure Laterality Date  . TOTAL HIP ARTHROPLASTY Left 03/10/2018   Procedure: LEFT TOTAL HIP ARTHROPLASTY ANTERIOR APPROACH;  Surgeon: Tarry Kos, MD;  Location: [facility] OR;  Service: Orthopedics;  Laterality: Left;    Immunization History  Administered Date(s) Administered  . Influenza,inj,Quad PF,6+ Mos 08/09/2017  . Tdap 07/12/2017    Current Meds  Medication Sig  . albuterol (PROVENTIL HFA;VENTOLIN HFA) 108 (90 Base) MCG/ACT inhaler Inhale 2 puffs into the lungs every 4 (four) hours as needed for wheezing or shortness of breath (cough, shortness of breath or wheezing.).  [name] allopurinol (ZYLOPRIM) 100 MG tablet Take 1 tablet (100 mg total) by  mouth 2 (two) times daily.  [name] aspirin EC 81 MG tablet Take 1 tablet (81 mg total) by mouth 2 (two) times daily.  . budesonide-formoterol (SYMBICORT) 80-4.5 MCG/ACT inhaler Inhale 2 puffs into the lungs 2 (two) times daily.  . Colchicine (MITIGARE) 0.6 MG CAPS Take 0.6 mg by mouth daily as needed (gout flare).  . hydrochlorothiazide (HYDRODIURIL) 25 MG tablet Take 1 tablet (25 mg total) by mouth daily.  [name] ketoconazole (NIZORAL) 2 % cream Apply 2 application topically 2 (two) times daily.  [name] losartan (COZAAR) 50 MG tablet Take 1 tablet (50 mg total) by mouth daily.  . methocarbamol (ROBAXIN) 750 MG tablet Take 1 tablet (750 mg total) by mouth 2 (two) times daily as needed for muscle spasms.  [name] omeprazole (PRILOSEC) 40 MG capsule Take 1 capsule (40 mg total) by mouth daily.  [name] oxyCODONE (OXY IR/ROXICODONE) 5 MG immediate release tablet Take 1-3 tablets (5-15 mg total) by mouth every 4 (four) hours as needed.  . senna-docusate (SENOKOT S) 8.6-50 MG tablet Take 1 tablet by mouth at bedtime as needed.  . SUMAtriptan (IMITREX) 100 MG tablet Take 100 mg by mouth every 2 (two) hours as needed for migraine. May repeat in 2 hours if headache persists or recurs.  . tamsulosin (FLOMAX) 0.4 MG CAPS capsule Take 1 capsule (0.4 mg total) by mouth daily.  . traMADol (ULTRAM) 50 MG tablet Take 1 tablet (50 mg total) by mouth 3 (three) times daily as needed.  . [DISCONTINUED] albuterol (PROVENTIL HFA;VENTOLIN HFA) 108 (90 Base) MCG/ACT inhaler Inhale 2 puffs into the lungs every 4 (four) hours as needed for wheezing or shortness of breath (cough, shortness of breath or wheezing.).  . [DISCONTINUED] allopurinol (ZYLOPRIM) 100 MG tablet Take 100 mg by mouth 2 (two) times daily.   . [DISCONTINUED] aspirin EC 81 MG tablet Take 1 tablet (81 mg total) by mouth 2 (two) times daily.  . [DISCONTINUED] budesonide-formoterol (SYMBICORT) 80-4.5 MCG/ACT inhaler Inhale 2 puffs into the lungs 2 (two) times daily.  . [DISCONTINUED]  Colchicine (MITIGARE) 0.6 MG CAPS Take 0.6 mg by mouth daily as needed (gout flare).   . [DISCONTINUED] hydrochlorothiazide (HYDRODIURIL) 25 MG tablet Take 1 tablet (25 mg total) by mouth daily.  . [DISCONTINUED] ketoconazole (NIZORAL) 2 % cream Apply 2 application topically.  . [DISCONTINUED] losartan (COZAAR) 50 MG tablet Take 1 tablet (50 mg total) by mouth daily.  . [DISCONTINUED] methocarbamol (ROBAXIN) 750 MG tablet  Take 1 tablet (750 mg total) by mouth 2 (two) times daily as needed for muscle spasms.  . [DISCONTINUED] methocarbamol (ROBAXIN) 750 MG tablet Take 1 tablet (750 mg total) by mouth 2 (two) times daily as needed for muscle spasms.  . [DISCONTINUED] omeprazole (PRILOSEC) 40 MG capsule Take 40 mg by mouth daily.  . [DISCONTINUED] senna-docusate (SENOKOT S) 8.6-50 MG tablet Take 1 tablet by mouth at bedtime as needed.  . [DISCONTINUED] tamsulosin (FLOMAX) 0.4 MG CAPS capsule Take 0.4 mg by mouth daily.     No Known Allergies  BP 118/72 (BP Location: Left Arm, Patient Position: Sitting, Cuff Size: Small)   Pulse 80   Temp 98.4 F (36.9 C) (Oral)   Ht 6' (1.829 m)   Wt 148 lb (67.1 kg)   SpO2 100%   BMI 20.07 kg/m      Review of Systems  Constitutional: Negative.   HENT: Positive for nosebleeds (occasional ).   Eyes: Positive for visual disturbance (Blurry Vision).  Respiratory: Positive for cough (r/t smoking).   Cardiovascular: Negative.   Gastrointestinal: Negative.   Endocrine: Negative.   Genitourinary: Negative.   Musculoskeletal: Negative.   Skin: Negative.   Allergic/Immunologic: Negative.   Neurological: Positive for dizziness and headaches.  Hematological: Negative.   Psychiatric/Behavioral: Negative.    Objective:   Physical Exam  Constitutional: He is oriented to person, place, and time. He appears well-developed and well-nourished.  HENT:  Head: Normocephalic and atraumatic.  Eyes: EOM are normal.  Neck: Normal range of motion. Neck supple.   Cardiovascular: Normal rate, regular rhythm, normal heart sounds and intact distal pulses.  Pulmonary/Chest: Effort normal and breath sounds normal.  Abdominal: Soft.  Musculoskeletal: Normal range of motion.  Neurological: He is alert and oriented to person, place, and time.  Skin: Skin is warm. Capillary refill takes less than 2 seconds.  Psychiatric: He has a normal mood and affect. His behavior is normal.  Nursing note and vitals reviewed.  Assessment & Plan:   1. Status post hip surgery Stable. No c/o pain today. He uses a cane to help with ambulation. He continues to walk daily.   2. Screening for diabetes mellitus Hgb A1c is within normal range of 5.5 today. He will continue to decrease foods/beverages high in sugars and carbs and follow Heart Healthy or DASH diet. Increase physical activity to at least 30 minutes cardio exercise daily.   - POCT glycosylated hemoglobin (Hb A1C) - POCT urinalysis dipstick  3. Insomnia, unspecified type We will initiate Trazodone today to aide with sleep.  - traZODone (DESYREL) 50 MG tablet; Take 0.5-1 tablets (25-50 mg total) by mouth at bedtime as needed for sleep.  Dispense: 60 tablet; Refill: 3  4. Essential hypertension Blood pressure is a normal level of 118/72 today. He will continue HCTZ and Cozaar as prescribed.   5. Gout, unspecified cause, unspecified chronicity, unspecified site Stable today. Continue Allopurinol and Colchicine as prescribed.   6. Chronic hepatitis C without hepatic coma (HCC) He continues to fo/llow with Dr. Maxwell Caul. Next office visit is scheduled for 05/26/2018 to review anti-viral medications.   7. Chronic obstructive pulmonary disease, unspecified COPD type (HCC) Stable. He will continue Symbicort and Albuterol Inhalers as prescribed.   8. Blurry vision, bilateral - Ambulatory referral to Ophthalmology  9. Multiple allergies We will send Rx of Zyrtec to pharmacy today.   10. Tobacco dependence States  that he currently smokes 2-3 cigarettes a day.   11. Encounter for smoking cessation counseling  Counseled on ways to help him quit smoking. He will contact office when he is ready to quit.   12. Follow up He will follow up in 3 months.   Meds ordered this encounter  Medications  . traZODone (DESYREL) 50 MG tablet    Sig: Take 0.5-1 tablets (25-50 mg total) by mouth at bedtime as needed for sleep.    Dispense:  60 tablet    Refill:  3  . cetirizine (ZYRTEC) 10 MG tablet    Sig: Take 1 tablet (10 mg total) by mouth daily.    Dispense:  30 tablet    Refill:  11  . albuterol (PROVENTIL HFA;VENTOLIN HFA) 108 (90 Base) MCG/ACT inhaler    Sig: Inhale 2 puffs into the lungs every 4 (four) hours as needed for wheezing or shortness of breath (cough, shortness of breath or wheezing.).    Dispense:  1 Inhaler    Refill:  3  . allopurinol (ZYLOPRIM) 100 MG tablet    Sig: Take 1 tablet (100 mg total) by mouth 2 (two) times daily.    Dispense:  60 tablet    Refill:  3  . aspirin EC 81 MG tablet    Sig: Take 1 tablet (81 mg total) by mouth 2 (two) times daily.    Dispense:  84 tablet    Refill:  3  . budesonide-formoterol (SYMBICORT) 80-4.5 MCG/ACT inhaler    Sig: Inhale 2 puffs into the lungs 2 (two) times daily.    Dispense:  1 Inhaler    Refill:  11  . Colchicine (MITIGARE) 0.6 MG CAPS    Sig: Take 0.6 mg by mouth daily as needed (gout flare).    Dispense:  30 capsule    Refill:  3  . hydrochlorothiazide (HYDRODIURIL) 25 MG tablet    Sig: Take 1 tablet (25 mg total) by mouth daily.    Dispense:  30 tablet    Refill:  3  . ketoconazole (NIZORAL) 2 % cream    Sig: Apply 2 application topically 2 (two) times daily.    Dispense:  15 g    Refill:  3  . losartan (COZAAR) 50 MG tablet    Sig: Take 1 tablet (50 mg total) by mouth daily.    Dispense:  30 tablet    Refill:  3  . DISCONTD: methocarbamol (ROBAXIN) 750 MG tablet    Sig: Take 1 tablet (750 mg total) by mouth 2 (two) times  daily as needed for muscle spasms.    Dispense:  60 tablet    Refill:  3  . omeprazole (PRILOSEC) 40 MG capsule    Sig: Take 1 capsule (40 mg total) by mouth daily.    Dispense:  30 capsule    Refill:  3  . senna-docusate (SENOKOT S) 8.6-50 MG tablet    Sig: Take 1 tablet by mouth at bedtime as needed.    Dispense:  30 tablet    Refill:  3  . tamsulosin (FLOMAX) 0.4 MG CAPS capsule    Sig: Take 1 capsule (0.4 mg total) by mouth daily.    Dispense:  30 capsule    Refill:  3  . methocarbamol (ROBAXIN) 750 MG tablet    Sig: Take 1 tablet (750 mg total) by mouth 2 (two) times daily as needed for muscle spasms.    Dispense:  60 tablet    Refill:  3    Referral Orders     Ambulatory referral to Ophthalmology  Raliegh Ip,  MSN, FNP-C [facility] [address] [place], [place] [address] [phone]

## 2018-05-26 ENCOUNTER — Other Ambulatory Visit: Payer: Self-pay | Admitting: Infectious Diseases

## 2018-05-26 ENCOUNTER — Encounter: Payer: Self-pay | Admitting: Infectious Diseases

## 2018-05-26 ENCOUNTER — Ambulatory Visit (INDEPENDENT_AMBULATORY_CARE_PROVIDER_SITE_OTHER): Payer: Medicaid Other | Admitting: Infectious Diseases

## 2018-05-26 DIAGNOSIS — M109 Gout, unspecified: Secondary | ICD-10-CM

## 2018-05-26 DIAGNOSIS — B182 Chronic viral hepatitis C: Secondary | ICD-10-CM

## 2018-05-26 NOTE — Assessment & Plan Note (Signed)
He will call his PCP to get his meds refilled. I offered to refill his allopurinol but he defers.

## 2018-05-26 NOTE — Addendum Note (Signed)
Addended by: Ichiro Chesnut C on: 05/26/2018 11:01 AM   Modules accepted: Orders

## 2018-05-26 NOTE — Progress Notes (Signed)
   Subjective:    Patient ID: Charles [name], male    DOB: 1961/10/06, 57 y.o.   MRN: [id number]  HPI 57 yo M with Hep C (1a VL 3.58 million), previous crack use, AVN of L hip.  He was seen in ID clinic on 12-04-17. He was also eval by pharm and was planned to be started on mavyret. He took this for 1 month but then was lost to f/u.  He has had f/u with ortho since that time. He is awaiting further eval and scheduling. He has new CV appt pending (hx of CHF).  His u/s was F1/2.  He underwent L THR 03-10-18. Today he c/o L ankle pain and swelling from gout for last 2-3 days.  Hip has been healing well, he is to have R TKR in October. Still has occas hip pain.   Review of Systems  Constitutional: Negative for appetite change and unexpected weight change.  Gastrointestinal: Negative for constipation and diarrhea.  Genitourinary: Negative for difficulty urinating.  Musculoskeletal: Positive for joint swelling.  Please see HPI. All other systems reviewed and negative.     Objective:   Physical Exam  Constitutional: He appears well-developed and well-nourished.  HENT:  Mouth/Throat: No oropharyngeal exudate.  Eyes: Pupils are equal, round, and reactive to light. EOM are normal.  Neck: Normal range of motion. Neck supple.  Cardiovascular: Normal rate, regular rhythm and normal heart sounds.  Pulmonary/Chest: Effort normal and breath sounds normal.  Abdominal: Soft. Bowel sounds are normal.  Musculoskeletal: He exhibits edema.  Lymphadenopathy:    He has no cervical adenopathy.  Skin: There is erythema.       Assessment & Plan:

## 2018-05-26 NOTE — Assessment & Plan Note (Signed)
Discussed with pharm Will restart his Hep C rx.  He agrees to this.  Will see him back in 3 months.

## 2018-05-27 ENCOUNTER — Other Ambulatory Visit (INDEPENDENT_AMBULATORY_CARE_PROVIDER_SITE_OTHER): Payer: Self-pay | Admitting: Orthopaedic Surgery

## 2018-05-27 ENCOUNTER — Other Ambulatory Visit (INDEPENDENT_AMBULATORY_CARE_PROVIDER_SITE_OTHER): Payer: Self-pay

## 2018-05-27 MED ORDER — TRAMADOL HCL 50 MG PO TABS: 50.0000 mg | ORAL_TABLET | Freq: Three times a day (TID) | ORAL | 0 refills | Status: DC | PRN

## 2018-05-27 NOTE — Telephone Encounter (Signed)
Called into pharm  

## 2018-05-28 LAB — HEPATITIS C RNA QUANTITATIVE
HCV Quantitative Log: <1.18 Log IU/mL
HCV RNA, PCR, QN: NOT DETECTED [IU]/mL

## 2018-06-05 ENCOUNTER — Other Ambulatory Visit: Payer: Medicaid Other

## 2018-06-10 ENCOUNTER — Ambulatory Visit (INDEPENDENT_AMBULATORY_CARE_PROVIDER_SITE_OTHER): Payer: Medicaid Other | Admitting: Orthopaedic Surgery

## 2018-06-10 ENCOUNTER — Ambulatory Visit (INDEPENDENT_AMBULATORY_CARE_PROVIDER_SITE_OTHER): Payer: Self-pay

## 2018-06-10 DIAGNOSIS — Z96642 Presence of left artificial hip joint: Secondary | ICD-10-CM | POA: Diagnosis not present

## 2018-06-10 DIAGNOSIS — M1711 Unilateral primary osteoarthritis, right knee: Secondary | ICD-10-CM

## 2018-06-10 NOTE — Progress Notes (Signed)
Office Visit Note   Patient: Charles [name]           Date of Birth: [birth year]           MRN: [id number] Visit Date: 06/10/2018              Requested by: Kallie Locks, FNP [address] [place], [place] [address] PCP: Kallie Locks, FNP   Assessment & Plan: Visit Diagnoses:  1. Status post hip replacement, left   2. Unilateral primary osteoarthritis, right knee     Plan: Charles [name] is doing well status post left total hip replacement.  Dental prophylaxis was reinforced.  From her right knee standpoint we will schedule his right total knee replacement in October per his wishes.  He denies a history of DVT or nickel allergy.  Follow-Up Instructions: Return if symptoms worsen or fail to improve.   Orders:  Orders Placed This Encounter  Procedures  . XR HIP UNILAT W OR W/O PELVIS 2-3 VIEWS LEFT   No orders of the defined types were placed in this encounter.     Procedures: No procedures performed   Clinical Data: No additional findings.   Subjective: Chief Complaint  Patient presents with  . Left Hip - Follow-up, Routine Post Op  . Right Knee - Follow-up    Aniruddha is 92 days status post left total hip replacement.  Overall he is doing well.  Occasionally he will have some stinging pain in his left hip.  His right knee is bothering him significantly.  He is walking with a cane.  He wishes to schedule right total knee replacement for October.   Review of Systems   Objective: Vital Signs: There were no vitals taken for this visit.  Physical Exam  Ortho Exam Left hip exam shows a fully healed surgical scar.  Painless movement of the hip. Right knee exam is stable. Specialty Comments:  No specialty comments available.  Imaging: Xr Hip Unilat W Or W/o Pelvis 2-3 Views Left  Result Date: 06/10/2018 Stable left total hip replacement without any evidence of complication.    PMFS History: Patient Active Problem List   Diagnosis Date Noted  . Gout 05/26/2018   . Chronic pain of right knee 04/29/2018  . Surgery, elective 03/10/2018  . Status post hip replacement, left 03/10/2018  . Syphilis 01/01/2018  . Presbyopia 01/01/2018  . Chronic viral hepatitis C (HCC) 12/04/2017  . AVN (avascular necrosis of bone) (HCC) 12/04/2017  . Essential hypertension 07/20/2017  . CHF (congestive heart failure) (HCC) 07/20/2017  . Chronic obstructive pulmonary disease (HCC) 07/20/2017   Past Medical History:  Diagnosis Date  . 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 COPD (chronic obstructive pulmonary disease) (HCC)   . Gout   . Hepatitis C virus carrier state (HCC)   . Hypertension   . Osteochondrosis   . Primary localized osteoarthritis of hip    Left    Family History  Problem Relation Age of Onset  . Hypertension Mother   . Hypertension Sister        twin  . Seizures Brother   . Schizophrenia Sister     Past Surgical History:  Procedure Laterality Date  . TOTAL HIP ARTHROPLASTY Left 03/10/2018   Procedure: LEFT TOTAL HIP ARTHROPLASTY ANTERIOR APPROACH;  Surgeon: Tarry Kos, MD;  Location: [facility] OR;  Service: Orthopedics;  Laterality: Left;   Social History   Occupational History  . Not on file  Tobacco Use  . Smoking status: Current Every Day Smoker    Packs/day: 0.20  . Smokeless tobacco: Never Used  Substance and Sexual Activity  . Alcohol use: Yes    Alcohol/week: 7.0 standard drinks    Types: 7 Cans of beer per week    Comment: OCCASIONAL  . Drug use: Yes    Types: Marijuana    Comment: 03/10/18 last smoked "a couple of weeks ago"  . Sexual activity: Never

## 2018-06-12 ENCOUNTER — Ambulatory Visit: Payer: Medicaid Other

## 2018-06-17 ENCOUNTER — Ambulatory Visit (INDEPENDENT_AMBULATORY_CARE_PROVIDER_SITE_OTHER): Payer: Medicaid Other | Admitting: Pharmacist

## 2018-06-17 DIAGNOSIS — B182 Chronic viral hepatitis C: Secondary | ICD-10-CM | POA: Diagnosis not present

## 2018-06-17 NOTE — Progress Notes (Signed)
HPI: Charles [name] is a 57 y.o. male presenting for HCV follow up. 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  Patient Active Problem List   Diagnosis Date Noted  . Gout 05/26/2018  . Chronic pain of right knee 04/29/2018  . Surgery, elective 03/10/2018  . Status post hip replacement, left 03/10/2018  . Syphilis 01/01/2018  . Presbyopia 01/01/2018  . Chronic viral hepatitis C (HCC) 12/04/2017  . AVN (avascular necrosis of bone) (HCC) 12/04/2017  . Essential hypertension 07/20/2017  . CHF (congestive heart failure) (HCC) 07/20/2017  . Chronic obstructive pulmonary disease (HCC) 07/20/2017    Patient's Medications  New Prescriptions   No medications on file  Previous Medications   ALBUTEROL (PROVENTIL HFA;VENTOLIN HFA) 108 (90 BASE) MCG/ACT INHALER    Inhale 2 puffs into the lungs every 4 (four) hours as needed for wheezing or shortness of breath (cough, shortness of breath or wheezing.).   ALLOPURINOL (ZYLOPRIM) 100 MG TABLET    Take 1 tablet (100 mg total) by mouth 2 (two) times daily.   ASPIRIN EC 81 MG TABLET    Take 1 tablet (81 mg total) by mouth 2 (two) times daily.   BUDESONIDE-FORMOTEROL (SYMBICORT) 80-4.5 MCG/ACT INHALER    Inhale 2 puffs into the lungs 2 (two) times daily.   CETIRIZINE (ZYRTEC) 10 MG TABLET    Take 1 tablet (10 mg total) by mouth daily.   COLCHICINE (MITIGARE) 0.6 MG CAPS    Take 0.6 mg by mouth daily as needed (gout flare).   GLECAPREVIR-PIBRENTASVIR (MAVYRET) 100-40 MG TABS    Take 3 tablets by mouth daily with supper.   HYDROCHLOROTHIAZIDE (HYDRODIURIL) 25 MG TABLET    Take 1 tablet (25 mg total) by mouth daily.   KETOCONAZOLE (NIZORAL) 2 % CREAM    Apply 2 application topically 2 (two) times daily.   LOSARTAN (COZAAR) 50 MG TABLET    Take 1 tablet (50 mg total) by mouth daily.   METHOCARBAMOL (ROBAXIN) 750 MG TABLET    Take 1 tablet (750 mg total) by mouth 2 (two) times daily as needed for muscle  spasms.   OMEPRAZOLE (PRILOSEC) 40 MG CAPSULE    Take 1 capsule (40 mg total) by mouth daily.   ONDANSETRON (ZOFRAN) 4 MG TABLET    Take 1-2 tablets (4-8 mg total) by mouth every 8 (eight) hours as needed for nausea or vomiting.   OXYCODONE (OXY IR/ROXICODONE) 5 MG IMMEDIATE RELEASE TABLET    Take 1-3 tablets (5-15 mg total) by mouth every 4 (four) hours as needed.   PROMETHAZINE (PHENERGAN) 25 MG TABLET    Take 1 tablet (25 mg total) by mouth every 6 (six) hours as needed for nausea.   SENNA-DOCUSATE (SENOKOT S) 8.6-50 MG TABLET    Take 1 tablet by mouth at bedtime as needed.   SUMATRIPTAN (IMITREX) 100 MG TABLET    Take 100 mg by mouth every 2 (two) hours as needed for migraine. May repeat in 2 hours if headache persists or recurs.   TAMSULOSIN (FLOMAX) 0.4 MG CAPS CAPSULE    Take 1 capsule (0.4 mg total) by mouth daily.   TRAMADOL (ULTRAM) 50 MG TABLET    Take 1 tablet (50 mg total) by mouth 3 (three) times daily as needed.   TRAZODONE (DESYREL) 50 MG TABLET    Take 0.5-1 tablets (25-50 mg total) by mouth at bedtime as needed for sleep.  Modified Medications  No medications on file  Discontinued Medications   No medications on file    Allergies: No Known Allergies  Past Medical History: Past Medical History:  Diagnosis Date  . 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 COPD (chronic obstructive pulmonary disease) (HCC)   . Gout   . Hepatitis C virus carrier state (HCC)   . Hypertension   . Osteochondrosis   . Primary localized osteoarthritis of hip    Left    Social History: Social History   Socioeconomic History  . Marital status: Single    Spouse name: Not on file  . Number of children: Not on file  . Years of education: Not on file  . Highest education level: Not on file  Occupational History  . Not on file  Social Needs  . Financial resource strain: Not on file  . Food insecurity:    Worry: Not on file    Inability: Not on file  . Transportation  needs:    Medical: Not on file    Non-medical: Not on file  Tobacco Use  . Smoking status: Current Every Day Smoker    Packs/day: 0.20  . Smokeless tobacco: Never Used  Substance and Sexual Activity  . Alcohol use: Yes    Alcohol/week: 7.0 standard drinks    Types: 7 Cans of beer per week    Comment: OCCASIONAL  . Drug use: Yes    Types: Marijuana    Comment: 03/10/18 last smoked "a couple of weeks ago"  . Sexual activity: Never  Lifestyle  . Physical activity:    Days per week: Not on file    Minutes per session: Not on file  . Stress: Not on file  Relationships  . Social connections:    Talks on phone: Not on file    Gets together: Not on file    Attends religious service: Not on file    Active member of club or organization: Not on file    Attends meetings of clubs or organizations: Not on file    Relationship status: Not on file  Other Topics Concern  . Not on file  Social History Narrative  . Not on file    Labs: Hepatitis C Lab Results  Component Value Date   HCVGENOTYPE 1a 07/12/2017   HCVRNAPCRQN <15 NOT DETECTED 05/26/2018   HCVRNAPCRQN <15 NOT DETECTED 01/23/2018   HCVRNAPCRQN 3,580,000 (H) 07/12/2017   FIBROSTAGE F1-F2 12/04/2017   Hepatitis B Lab Results  Component Value Date   HEPBSAB NON-REACTIVE 12/04/2017   HEPBSAG NON-REACTIVE 12/04/2017   Hepatitis A Lab Results  Component Value Date   HAV REACTIVE (A) 12/04/2017   HIV Lab Results  Component Value Date   HIV NON-REACTIVE 12/04/2017   Lab Results  Component Value Date   CREATININE 1.04 03/11/2018   CREATININE 0.93 03/04/2018   CREATININE 1.00 01/23/2018   CREATININE 0.78 11/11/2017   CREATININE 0.95 09/06/2017   Lab Results  Component Value Date   AST 105 (H) 03/04/2018   AST 27 01/23/2018   AST 30 11/11/2017   ALT 69 (H) 03/04/2018   ALT 20 01/23/2018   ALT 18 12/04/2017   INR 0.99 03/10/2018   INR 1.28 03/04/2018   INR 1.0 12/04/2017    Assessment: Charles [name] is here for  follow up for HCV. He was initially seen earlier this year and prescribed Mavyret x 8 weeks to start at the end of 12/2017 but only took 4 weeks. He  reports that this is because he had hip surgery in June and that is why he did not get the second 4 weeks but this timeline doesn't exactly line up. However, his HCV RNA was undetectable in 01/2018 and again in 05/2018. Therefore, the decision was made not to retreat and continue to follow his viral load.  Charles [name], Charles need to make sure to coordinate appropriately.   Plan: HCV RNA and NS5a resistance F/u results  - If negative, f/u again in 3 months - If positive, f/u for retreatment  Nataline Basara N. Zigmund Daniel, PharmD PGY2 Infectious Diseases Pharmacy Resident Phone: [phone] 06/17/2018, 11:10 AM

## 2018-06-19 NOTE — Progress Notes (Signed)
It seems like he is cured now... Will bring him back in 3 months to check one final time. Does that sound ok?

## 2018-06-20 ENCOUNTER — Ambulatory Visit: Payer: Medicaid Other | Admitting: Infectious Diseases

## 2018-06-22 LAB — HCV RNA NS5A DRUG RESISTANCE: HCV NS5A SUBTYPE: NOT DETECTED

## 2018-06-22 LAB — HEPATITIS C RNA QUANTITATIVE
HCV Quantitative Log: <1.18 Log IU/mL
HCV RNA, PCR, QN: <15 IU/mL

## 2018-06-23 ENCOUNTER — Telehealth: Payer: Self-pay | Admitting: Pharmacist

## 2018-06-23 NOTE — Addendum Note (Signed)
Addended by: Aggie CosierKUPPELWEISER, CASSIE L on: 06/23/2018 11:10 AM   Modules accepted: Orders

## 2018-06-23 NOTE — Telephone Encounter (Addendum)
Called patient to let him know that his Hep C viral load was still undetectable. Need to schedule a 3 month follow-up for him. No answer, so left VM.

## 2018-07-02 ENCOUNTER — Other Ambulatory Visit: Payer: Self-pay

## 2018-07-02 DIAGNOSIS — I10 Essential (primary) hypertension: Secondary | ICD-10-CM

## 2018-07-02 DIAGNOSIS — J449 Chronic obstructive pulmonary disease, unspecified: Secondary | ICD-10-CM

## 2018-07-02 DIAGNOSIS — G4700 Insomnia, unspecified: Secondary | ICD-10-CM

## 2018-07-07 ENCOUNTER — Encounter (HOSPITAL_COMMUNITY): Payer: Self-pay

## 2018-07-07 NOTE — Pre-Procedure Instructions (Addendum)
Ashtan Girtman  07/07/2018    Your procedure is scheduled on Monday, July 14, 2018 at 10:20 AM.   Report to [facility] Entrance "A" Admitting Office at 8:20 AM.   Call this number if you have problems the morning of surgery: [phone]   Questions prior to day of surgery, please call [phone] between 8 & 4 PM.   Remember:  Do not eat or drink after midnight Sunday, 07/13/18.            Take these medicines the morning of surgery with A SIP OF WATER: Cetirizine (Zyrtec), Omeprazole (Prilosec), Tamsulosin (Flomax), Tramadol - if needed, Symbicort inhaler, Albuterol inhaler - if needed (bring this inhaler with you morning of surgery)  Stop Aspirin as of today.   Do not use NSAIDS (Ibuprofen, Aleve, etc) or Aspirin products (Goody's, BC Powder, etc) prior to surgery.  Do NOT smoke 24 hours prior to surgery.    Do not wear jewelry.  Do not wear lotions, powders, cologne or deodorant.  Men may shave face and neck.  Do not bring valuables to the hospital.  [facility] is not responsible for any belongings or valuables.  Contacts, dentures or bridgework may not be worn into surgery.  Leave your suitcase in the car.  After surgery it may be brought to your room.  For patients admitted to the hospital, discharge time will be determined by your treatment team.  [facility] - Preparing for Surgery  Before surgery, you can play an important role.  Because skin is not sterile, your skin needs to be as free of germs as possible.  You can reduce the number of germs on you skin by washing with CHG (chlorahexidine gluconate) soap before surgery.  CHG is an antiseptic cleaner which kills germs and bonds with the skin to continue killing germs even after washing.  Oral Hygiene is also important in reducing the risk of infection.  Remember to brush your teeth with your regular toothpaste the morning of surgery.  Please DO NOT use if you have an allergy to CHG or antibacterial soaps.  If  your skin becomes reddened/irritated stop using the CHG and inform your nurse when you arrive at Short Stay.  Do not shave (including legs and underarms) for at least 48 hours prior to the first CHG shower.  You may shave your face.  Please follow these instructions carefully:   1.  Shower with CHG Soap the night before surgery and the morning of Surgery.  2.  If you choose to wash your hair, wash your hair first as usual with your normal shampoo.  3.  After you shampoo, rinse your hair and body thoroughly to remove the shampoo. 4.  Use CHG as you would any other liquid soap.  You can apply chg directly to the skin and wash gently with a      scrungie or washcloth.           5.  Apply the CHG Soap to your body ONLY FROM THE NECK DOWN.   Do not use on open wounds or open sores. Avoid contact with your eyes, ears, mouth and genitals (private parts).  Wash genitals (private parts) with your normal soap.  6.  Wash thoroughly, paying special attention to the area where your surgery will be performed.  7.  Thoroughly rinse your body with warm water from the neck down.  8.  DO NOT shower/wash with your normal soap after using and rinsing off the  CHG Soap.  9.  Pat yourself dry with a clean towel.            10.  Wear clean pajamas.            11.  Place clean sheets on your bed the night of your first shower and do not sleep with pets.  Day of Surgery  Shower as above. Do not apply any lotions/deodorants the morning of surgery.   Please wear clean clothes to the hospital. Remember to brush your teeth with toothpaste.   Please read over the fact sheets that you were given.

## 2018-07-07 NOTE — Pre-Procedure Instructions (Signed)
Charles [name]  07/07/2018      [facility] - [place], [place] - [address] [place] [address] Phone: [phone] Fax: [phone]    Your procedure is scheduled on October 7th.  Report to [facility] Admitting at [phone] A.M.  Call this number if you have problems the morning of surgery:  [phone]   Remember:  Do not eat or drink after midnight.     Take these medicines the morning of surgery with A SIP OF WATER   Albuterol (if needed), Allopurinol, Symbicort, Zyrtec, Robaxin (if needed), Prilosec, Zofran (if needed), Oxycodone  (if needed), Phenergan (if needed), Flomax, Tramadol (if needed)    Do not wear jewelry  Do not wear lotions, powders, or colognes, or deodorant.  Do not shave 48 hours prior to surgery.  Men may shave face and neck.  Do not bring valuables to the hospital.  [facility] is not responsible for any belongings or valuables.  Contacts, dentures or bridgework may not be worn into surgery.  Leave your suitcase in the car.  After surgery it may be brought to your room.  For patients admitted to the hospital, discharge time will be determined by your treatment team.  Patients discharged the day of surgery will not be allowed to drive home.    [facility]- Preparing For Surgery  Before surgery, you can play an important role. Because skin is not sterile, your skin needs to be as free of germs as possible. You can reduce the number of germs on your skin by washing with CHG (chlorahexidine gluconate) Soap before surgery.  CHG is an antiseptic cleaner which kills germs and bonds with the skin to continue killing germs even after washing.    Oral Hygiene is also important to reduce your risk of infection.  Remember - BRUSH YOUR TEETH THE MORNING OF SURGERY WITH YOUR REGULAR TOOTHPASTE  Please do not use if you have an allergy to CHG or antibacterial soaps. If your skin becomes reddened/irritated stop  using the CHG.  Do not shave (including legs and underarms) for at least 48 hours prior to first CHG shower. It is OK to shave your face.  Please follow these instructions carefully.   1. Shower the NIGHT BEFORE SURGERY and the MORNING OF SURGERY with CHG.   2. If you chose to wash your hair, wash your hair first as usual with your normal shampoo.  3. After you shampoo, rinse your hair and body thoroughly to remove the shampoo.  4. Use CHG as you would any other liquid soap. You can apply CHG directly to the skin and wash gently with a scrungie or a clean washcloth.   5. Apply the CHG Soap to your body ONLY FROM THE NECK DOWN.  Do not use on open wounds or open sores. Avoid contact with your eyes, ears, mouth and genitals (private parts). Wash Face and genitals (private parts)  with your normal soap.  6. Wash thoroughly, paying special attention to the area where your surgery will be performed.  7. Thoroughly rinse your body with warm water from the neck down.  8. DO NOT shower/wash with your normal soap after using and rinsing off the CHG Soap.  9. Pat yourself dry with a CLEAN TOWEL.  10. Wear CLEAN PAJAMAS to bed the night before surgery, wear comfortable clothes the morning of surgery  11. Place CLEAN SHEETS on your bed the night of your first  shower and DO NOT SLEEP WITH PETS.    Day of Surgery:  Do not apply any deodorants/lotions.  Please wear clean clothes to the hospital/surgery center.   Remember to brush your teeth WITH YOUR REGULAR TOOTHPASTE.    Please read over the following fact sheets that you were given.

## 2018-07-08 ENCOUNTER — Other Ambulatory Visit: Payer: Self-pay

## 2018-07-08 ENCOUNTER — Encounter (HOSPITAL_COMMUNITY)
Admission: RE | Admit: 2018-07-08 | Discharge: 2018-07-08 | Disposition: A | Discharge status: Home / Self Care | Payer: Medicaid Other | Source: Ambulatory Visit | Attending: Orthopaedic Surgery | Admitting: Orthopaedic Surgery

## 2018-07-08 ENCOUNTER — Other Ambulatory Visit (INDEPENDENT_AMBULATORY_CARE_PROVIDER_SITE_OTHER): Payer: Self-pay | Admitting: Physician Assistant

## 2018-07-08 ENCOUNTER — Encounter (HOSPITAL_COMMUNITY): Payer: Self-pay

## 2018-07-08 DIAGNOSIS — Z79899 Other long term (current) drug therapy: Secondary | ICD-10-CM | POA: Insufficient documentation

## 2018-07-08 DIAGNOSIS — E876 Hypokalemia: Secondary | ICD-10-CM | POA: Diagnosis not present

## 2018-07-08 DIAGNOSIS — Z01812 Encounter for preprocedural laboratory examination: Secondary | ICD-10-CM | POA: Diagnosis not present

## 2018-07-08 HISTORY — DX: Headache, unspecified: R51.9

## 2018-07-08 HISTORY — DX: Gastro-esophageal reflux disease without esophagitis: K21.9

## 2018-07-08 HISTORY — DX: Unspecified asthma, uncomplicated: J45.909

## 2018-07-08 HISTORY — DX: Headache: R51

## 2018-07-08 HISTORY — DX: Heart failure, unspecified: I50.9

## 2018-07-08 LAB — COMPREHENSIVE METABOLIC PANEL
ALT: 16 U/L (ref 0–44)
ANION GAP: 10 (ref 5–15)
AST: 21 U/L (ref 15–41)
Albumin: 3.7 g/dL (ref 3.5–5.0)
Alkaline Phosphatase: 123 U/L (ref 38–126)
BILIRUBIN TOTAL: 0.6 mg/dL (ref 0.3–1.2)
BUN: 6 mg/dL (ref 6–20)
CALCIUM: 8.9 mg/dL (ref 8.9–10.3)
CO2: 28 mmol/L (ref 22–32)
Chloride: 99 mmol/L (ref 98–111)
Creatinine, Ser: 0.91 mg/dL (ref 0.61–1.24)
GLUCOSE: 147 mg/dL — AB (ref 70–99)
Potassium: 3.2 mmol/L — ABNORMAL LOW (ref 3.5–5.1)
Sodium: 137 mmol/L (ref 135–145)
TOTAL PROTEIN: 7.4 g/dL (ref 6.5–8.1)

## 2018-07-08 LAB — CBC WITH DIFFERENTIAL/PLATELET
Abs Immature Granulocytes: 0 10*3/uL (ref 0.0–0.1)
Basophils Absolute: 0.1 10*3/uL (ref 0.0–0.1)
Basophils Relative: 1 %
EOS ABS: 0.1 10*3/uL (ref 0.0–0.7)
EOS PCT: 2 %
HEMATOCRIT: 36.1 % — AB (ref 39.0–52.0)
HEMOGLOBIN: 11.6 g/dL — AB (ref 13.0–17.0)
Immature Granulocytes: 1 %
LYMPHS ABS: 1.9 10*3/uL (ref 0.7–4.0)
LYMPHS PCT: 32 %
MCH: 25.4 pg — AB (ref 26.0–34.0)
MCHC: 32.1 g/dL (ref 30.0–36.0)
MCV: 79.2 fL (ref 78.0–100.0)
MONO ABS: 0.8 10*3/uL (ref 0.1–1.0)
Monocytes Relative: 14 %
Neutro Abs: 3 10*3/uL (ref 1.7–7.7)
Neutrophils Relative %: 50 %
Platelets: 332 10*3/uL (ref 150–400)
RBC: 4.56 MIL/uL (ref 4.22–5.81)
RDW: 18.6 % — AB (ref 11.5–15.5)
WBC: 6 10*3/uL (ref 4.0–10.5)

## 2018-07-08 LAB — SURGICAL PCR SCREEN
MRSA, PCR: NEGATIVE
Staphylococcus aureus: NEGATIVE

## 2018-07-08 LAB — TYPE AND SCREEN
ABO/RH(D): O POS
ANTIBODY SCREEN: NEGATIVE

## 2018-07-08 LAB — PROTIME-INR
INR: 1.06
PROTHROMBIN TIME: 13.7 s (ref 11.4–15.2)

## 2018-07-08 LAB — APTT: aPTT: 30 seconds (ref 24–36)

## 2018-07-08 MED ORDER — POTASSIUM CHLORIDE ER 10 MEQ PO TBCR: EXTENDED_RELEASE_TABLET | ORAL | 0 refills | Status: DC

## 2018-07-08 NOTE — Progress Notes (Addendum)
Pt denies cardiac history, chest pain or sob. Pt states he's not diabetic. Pt lives in a drug/alcohol rehab facility.   EKG -

## 2018-07-08 NOTE — Progress Notes (Signed)
On hctz which I bet is what Is causing the hypokalemia?  Should I call in kdur bid x 4 days?

## 2018-07-11 MED ORDER — TRANEXAMIC ACID 1000 MG/10ML IV SOLN
2000.0000 mg | INTRAVENOUS | Status: AC
  Administered 2018-07-14: 2000 mg via TOPICAL
  Filled 2018-07-11: qty 20

## 2018-07-11 MED ORDER — TRANEXAMIC ACID 1000 MG/10ML IV SOLN
1000.0000 mg | INTRAVENOUS | Status: AC
  Administered 2018-07-14: 1000 mg via INTRAVENOUS
  Filled 2018-07-11: qty 1000

## 2018-07-14 ENCOUNTER — Encounter (HOSPITAL_COMMUNITY): Payer: Self-pay

## 2018-07-14 ENCOUNTER — Inpatient Hospital Stay (HOSPITAL_COMMUNITY): Payer: Medicaid Other | Admitting: Certified Registered Nurse Anesthetist

## 2018-07-14 ENCOUNTER — Inpatient Hospital Stay (HOSPITAL_COMMUNITY): Payer: Medicaid Other

## 2018-07-14 ENCOUNTER — Other Ambulatory Visit: Payer: Self-pay

## 2018-07-14 ENCOUNTER — Inpatient Hospital Stay (HOSPITAL_COMMUNITY)
Admission: RE | Admit: 2018-07-14 | Discharge: 2018-07-15 | DRG: 470 | Disposition: A | Discharge status: Skilled Nursing Facility | Payer: Medicaid Other | Source: Ambulatory Visit | Attending: Orthopaedic Surgery | Admitting: Orthopaedic Surgery

## 2018-07-14 ENCOUNTER — Encounter (HOSPITAL_COMMUNITY)
Admission: RE | Disposition: A | Discharge status: Skilled Nursing Facility | Payer: Self-pay | Source: Ambulatory Visit | Attending: Orthopaedic Surgery

## 2018-07-14 DIAGNOSIS — J449 Chronic obstructive pulmonary disease, unspecified: Secondary | ICD-10-CM | POA: Diagnosis present

## 2018-07-14 DIAGNOSIS — M109 Gout, unspecified: Secondary | ICD-10-CM | POA: Diagnosis present

## 2018-07-14 DIAGNOSIS — I110 Hypertensive heart disease with heart failure: Secondary | ICD-10-CM | POA: Diagnosis present

## 2018-07-14 DIAGNOSIS — K219 Gastro-esophageal reflux disease without esophagitis: Secondary | ICD-10-CM | POA: Diagnosis present

## 2018-07-14 DIAGNOSIS — I509 Heart failure, unspecified: Secondary | ICD-10-CM | POA: Diagnosis present

## 2018-07-14 DIAGNOSIS — Z7951 Long term (current) use of inhaled steroids: Secondary | ICD-10-CM | POA: Diagnosis not present

## 2018-07-14 DIAGNOSIS — B182 Chronic viral hepatitis C: Secondary | ICD-10-CM | POA: Diagnosis present

## 2018-07-14 DIAGNOSIS — Z96651 Presence of right artificial knee joint: Secondary | ICD-10-CM

## 2018-07-14 DIAGNOSIS — M1711 Unilateral primary osteoarthritis, right knee: Secondary | ICD-10-CM | POA: Diagnosis present

## 2018-07-14 DIAGNOSIS — F17210 Nicotine dependence, cigarettes, uncomplicated: Secondary | ICD-10-CM | POA: Diagnosis present

## 2018-07-14 DIAGNOSIS — Z7982 Long term (current) use of aspirin: Secondary | ICD-10-CM

## 2018-07-14 DIAGNOSIS — Z96642 Presence of left artificial hip joint: Secondary | ICD-10-CM | POA: Diagnosis present

## 2018-07-14 DIAGNOSIS — D62 Acute posthemorrhagic anemia: Secondary | ICD-10-CM | POA: Diagnosis not present

## 2018-07-14 DIAGNOSIS — Z79899 Other long term (current) drug therapy: Secondary | ICD-10-CM | POA: Diagnosis not present

## 2018-07-14 DIAGNOSIS — Z96659 Presence of unspecified artificial knee joint: Secondary | ICD-10-CM

## 2018-07-14 HISTORY — PX: TOTAL KNEE ARTHROPLASTY: SHX125

## 2018-07-14 SURGERY — ARTHROPLASTY, KNEE, TOTAL
Anesthesia: Spinal | Site: Knee | Laterality: Right

## 2018-07-14 MED ORDER — OXYCODONE HCL 5 MG PO TABS
5.0000 mg | ORAL_TABLET | ORAL | Status: DC | PRN
  Administered 2018-07-15: 10 mg via ORAL
  Filled 2018-07-14: qty 2

## 2018-07-14 MED ORDER — ALLOPURINOL 100 MG PO TABS
100.0000 mg | ORAL_TABLET | Freq: Two times a day (BID) | ORAL | Status: DC
  Administered 2018-07-14 – 2018-07-15 (×2): 100 mg via ORAL
  Filled 2018-07-14 (×2): qty 1

## 2018-07-14 MED ORDER — MIDAZOLAM HCL 5 MG/5ML IJ SOLN
INTRAMUSCULAR | Status: DC | PRN
  Administered 2018-07-14: 1 mg via INTRAVENOUS

## 2018-07-14 MED ORDER — OXYCODONE HCL ER 10 MG PO T12A
10.0000 mg | EXTENDED_RELEASE_TABLET | Freq: Two times a day (BID) | ORAL | Status: DC
  Administered 2018-07-14 – 2018-07-15 (×2): 10 mg via ORAL
  Filled 2018-07-14 (×2): qty 1

## 2018-07-14 MED ORDER — VANCOMYCIN HCL 1000 MG IV SOLR
INTRAVENOUS | Status: AC
  Filled 2018-07-14: qty 1000

## 2018-07-14 MED ORDER — FENTANYL CITRATE (PF) 250 MCG/5ML IJ SOLN
INTRAMUSCULAR | Status: AC
  Filled 2018-07-14: qty 5

## 2018-07-14 MED ORDER — LACTATED RINGERS IV SOLN
INTRAVENOUS | Status: DC | PRN
  Administered 2018-07-14: 10:00:00 via INTRAVENOUS

## 2018-07-14 MED ORDER — GABAPENTIN 300 MG PO CAPS
300.0000 mg | ORAL_CAPSULE | Freq: Three times a day (TID) | ORAL | Status: DC
  Administered 2018-07-14 – 2018-07-15 (×3): 300 mg via ORAL
  Filled 2018-07-14 (×3): qty 1

## 2018-07-14 MED ORDER — MIDAZOLAM HCL 2 MG/2ML IJ SOLN
INTRAMUSCULAR | Status: AC
  Administered 2018-07-14: 2 mg via INTRAVENOUS
  Filled 2018-07-14: qty 2

## 2018-07-14 MED ORDER — CHLORHEXIDINE GLUCONATE 4 % EX LIQD: 60.0000 mL | Freq: Once | CUTANEOUS | Status: DC

## 2018-07-14 MED ORDER — METOCLOPRAMIDE HCL 5 MG/ML IJ SOLN: 5.0000 mg | Freq: Three times a day (TID) | INTRAMUSCULAR | Status: DC | PRN

## 2018-07-14 MED ORDER — PROPOFOL 500 MG/50ML IV EMUL
INTRAVENOUS | Status: DC | PRN
  Administered 2018-07-14: 75 ug/kg/min via INTRAVENOUS

## 2018-07-14 MED ORDER — SODIUM CHLORIDE 0.9 % IV SOLN
INTRAVENOUS | Status: DC | PRN
  Administered 2018-07-14: 25 ug/min via INTRAVENOUS

## 2018-07-14 MED ORDER — FENTANYL CITRATE (PF) 100 MCG/2ML IJ SOLN
INTRAMUSCULAR | Status: AC
  Filled 2018-07-14: qty 2

## 2018-07-14 MED ORDER — FENTANYL CITRATE (PF) 100 MCG/2ML IJ SOLN
INTRAMUSCULAR | Status: DC | PRN
  Administered 2018-07-14: 50 ug via INTRAVENOUS

## 2018-07-14 MED ORDER — POLYVINYL ALCOHOL 1.4 % OP SOLN: 1.0000 [drp] | OPHTHALMIC | Status: DC | PRN

## 2018-07-14 MED ORDER — MIDAZOLAM HCL 2 MG/2ML IJ SOLN
INTRAMUSCULAR | Status: AC
  Filled 2018-07-14: qty 2

## 2018-07-14 MED ORDER — GLECAPREVIR-PIBRENTASVIR 100-40 MG PO TABS: 3.0000 | ORAL_TABLET | Freq: Every day | ORAL | Status: DC

## 2018-07-14 MED ORDER — ONDANSETRON HCL 4 MG PO TABS: 4.0000 mg | ORAL_TABLET | Freq: Three times a day (TID) | ORAL | 0 refills | Status: DC | PRN

## 2018-07-14 MED ORDER — OXYCODONE HCL 5 MG PO TABS: 5.0000 mg | ORAL_TABLET | Freq: Once | ORAL | Status: DC | PRN

## 2018-07-14 MED ORDER — ONDANSETRON HCL 4 MG/2ML IJ SOLN: 4.0000 mg | Freq: Four times a day (QID) | INTRAMUSCULAR | Status: DC | PRN

## 2018-07-14 MED ORDER — TRANEXAMIC ACID 1000 MG/10ML IV SOLN
1000.0000 mg | Freq: Once | INTRAVENOUS | Status: DC
  Filled 2018-07-14: qty 10

## 2018-07-14 MED ORDER — MAGNESIUM CITRATE PO SOLN: 1.0000 | Freq: Once | ORAL | Status: DC | PRN

## 2018-07-14 MED ORDER — ALBUTEROL SULFATE (2.5 MG/3ML) 0.083% IN NEBU: 2.5000 mg | INHALATION_SOLUTION | RESPIRATORY_TRACT | Status: DC | PRN

## 2018-07-14 MED ORDER — SODIUM CHLORIDE 0.9% FLUSH
INTRAVENOUS | Status: DC | PRN
  Administered 2018-07-14: 40 mL

## 2018-07-14 MED ORDER — BUPIVACAINE LIPOSOME 1.3 % IJ SUSP
20.0000 mL | INTRAMUSCULAR | Status: AC
  Administered 2018-07-14: 20 mL
  Filled 2018-07-14: qty 20

## 2018-07-14 MED ORDER — PHENOL 1.4 % MT LIQD: 1.0000 | OROMUCOSAL | Status: DC | PRN

## 2018-07-14 MED ORDER — LOSARTAN POTASSIUM 50 MG PO TABS
50.0000 mg | ORAL_TABLET | Freq: Every day | ORAL | Status: DC
  Administered 2018-07-15: 50 mg via ORAL
  Filled 2018-07-14: qty 1

## 2018-07-14 MED ORDER — DOCUSATE SODIUM 100 MG PO CAPS
100.0000 mg | ORAL_CAPSULE | Freq: Two times a day (BID) | ORAL | Status: DC
  Administered 2018-07-14 – 2018-07-15 (×2): 100 mg via ORAL
  Filled 2018-07-14 (×2): qty 1

## 2018-07-14 MED ORDER — POLYETHYLENE GLYCOL 3350 17 G PO PACK: 17.0000 g | PACK | Freq: Every day | ORAL | Status: DC | PRN

## 2018-07-14 MED ORDER — LACTATED RINGERS IV SOLN: INTRAVENOUS | Status: DC

## 2018-07-14 MED ORDER — CELECOXIB 200 MG PO CAPS
200.0000 mg | ORAL_CAPSULE | Freq: Two times a day (BID) | ORAL | Status: DC
  Administered 2018-07-14 – 2018-07-15 (×2): 200 mg via ORAL
  Filled 2018-07-14 (×2): qty 1

## 2018-07-14 MED ORDER — METHOCARBAMOL 750 MG PO TABS: 750.0000 mg | ORAL_TABLET | Freq: Two times a day (BID) | ORAL | 0 refills | Status: DC | PRN

## 2018-07-14 MED ORDER — HYDROMORPHONE HCL 1 MG/ML IJ SOLN
0.5000 mg | INTRAMUSCULAR | Status: DC | PRN
  Administered 2018-07-14 – 2018-07-15 (×2): 1 mg via INTRAVENOUS
  Filled 2018-07-14 (×2): qty 1

## 2018-07-14 MED ORDER — ASPIRIN 81 MG PO CHEW
81.0000 mg | CHEWABLE_TABLET | Freq: Two times a day (BID) | ORAL | Status: DC
  Administered 2018-07-14 – 2018-07-15 (×2): 81 mg via ORAL
  Filled 2018-07-14 (×2): qty 1

## 2018-07-14 MED ORDER — BUPIVACAINE IN DEXTROSE 0.75-8.25 % IT SOLN
INTRATHECAL | Status: DC | PRN
  Administered 2018-07-14: 1.8 mL via INTRATHECAL

## 2018-07-14 MED ORDER — MOMETASONE FURO-FORMOTEROL FUM 100-5 MCG/ACT IN AERO
2.0000 | INHALATION_SPRAY | Freq: Two times a day (BID) | RESPIRATORY_TRACT | Status: DC
  Administered 2018-07-14 – 2018-07-15 (×2): 2 via RESPIRATORY_TRACT
  Filled 2018-07-14: qty 8.8

## 2018-07-14 MED ORDER — METHOCARBAMOL 1000 MG/10ML IJ SOLN
500.0000 mg | Freq: Four times a day (QID) | INTRAVENOUS | Status: DC | PRN
  Filled 2018-07-14: qty 5

## 2018-07-14 MED ORDER — ALUM & MAG HYDROXIDE-SIMETH 200-200-20 MG/5ML PO SUSP: 30.0000 mL | ORAL | Status: DC | PRN

## 2018-07-14 MED ORDER — SODIUM CHLORIDE 0.9 % IV SOLN
INTRAVENOUS | Status: DC
  Administered 2018-07-14: 17:00:00 via INTRAVENOUS

## 2018-07-14 MED ORDER — MENTHOL 3 MG MT LOZG: 1.0000 | LOZENGE | OROMUCOSAL | Status: DC | PRN

## 2018-07-14 MED ORDER — FENTANYL CITRATE (PF) 100 MCG/2ML IJ SOLN
50.0000 ug | Freq: Once | INTRAMUSCULAR | Status: AC
  Administered 2018-07-14: 50 ug via INTRAVENOUS

## 2018-07-14 MED ORDER — VANCOMYCIN HCL 1000 MG IV SOLR
INTRAVENOUS | Status: DC | PRN
  Administered 2018-07-14: 1000 mg

## 2018-07-14 MED ORDER — FENTANYL CITRATE (PF) 100 MCG/2ML IJ SOLN
INTRAMUSCULAR | Status: AC
  Administered 2018-07-14: 50 ug via INTRAVENOUS
  Filled 2018-07-14: qty 2

## 2018-07-14 MED ORDER — KETOROLAC TROMETHAMINE 15 MG/ML IJ SOLN
30.0000 mg | Freq: Four times a day (QID) | INTRAMUSCULAR | Status: AC
  Administered 2018-07-14 – 2018-07-15 (×4): 30 mg via INTRAVENOUS
  Filled 2018-07-14 (×4): qty 2

## 2018-07-14 MED ORDER — SUMATRIPTAN SUCCINATE 100 MG PO TABS
100.0000 mg | ORAL_TABLET | ORAL | Status: DC | PRN
  Filled 2018-07-14: qty 1

## 2018-07-14 MED ORDER — PROMETHAZINE HCL 25 MG PO TABS: 25.0000 mg | ORAL_TABLET | Freq: Four times a day (QID) | ORAL | 1 refills | Status: DC | PRN

## 2018-07-14 MED ORDER — OXYCODONE HCL 5 MG PO TABS
10.0000 mg | ORAL_TABLET | ORAL | Status: DC | PRN
  Administered 2018-07-14: 10 mg via ORAL
  Administered 2018-07-14 – 2018-07-15 (×2): 15 mg via ORAL
  Filled 2018-07-14 (×2): qty 3
  Filled 2018-07-14: qty 2

## 2018-07-14 MED ORDER — ONDANSETRON HCL 4 MG/2ML IJ SOLN: 4.0000 mg | Freq: Once | INTRAMUSCULAR | Status: DC | PRN

## 2018-07-14 MED ORDER — TRAZODONE HCL 50 MG PO TABS: 25.0000 mg | ORAL_TABLET | Freq: Every evening | ORAL | Status: DC | PRN

## 2018-07-14 MED ORDER — ROPIVACAINE HCL 7.5 MG/ML IJ SOLN
INTRAMUSCULAR | Status: DC | PRN
  Administered 2018-07-14: 20 mL via PERINEURAL

## 2018-07-14 MED ORDER — 0.9 % SODIUM CHLORIDE (POUR BTL) OPTIME
TOPICAL | Status: DC | PRN
  Administered 2018-07-14: 1000 mL

## 2018-07-14 MED ORDER — TAMSULOSIN HCL 0.4 MG PO CAPS
0.4000 mg | ORAL_CAPSULE | Freq: Every day | ORAL | Status: DC
Start: 2018-07-14 — End: 2018-07-15
  Administered 2018-07-15: 0.4 mg via ORAL
  Filled 2018-07-14: qty 1

## 2018-07-14 MED ORDER — ACETAMINOPHEN 325 MG PO TABS: 325.0000 mg | ORAL_TABLET | Freq: Four times a day (QID) | ORAL | Status: DC | PRN

## 2018-07-14 MED ORDER — DIPHENHYDRAMINE HCL 12.5 MG/5ML PO ELIX: 25.0000 mg | ORAL_SOLUTION | ORAL | Status: DC | PRN

## 2018-07-14 MED ORDER — ASPIRIN EC 81 MG PO TBEC: 81.0000 mg | DELAYED_RELEASE_TABLET | Freq: Two times a day (BID) | ORAL | 0 refills | Status: DC

## 2018-07-14 MED ORDER — ONDANSETRON HCL 4 MG PO TABS: 4.0000 mg | ORAL_TABLET | Freq: Four times a day (QID) | ORAL | Status: DC | PRN

## 2018-07-14 MED ORDER — OXYCODONE HCL 5 MG/5ML PO SOLN: 5.0000 mg | Freq: Once | ORAL | Status: DC | PRN

## 2018-07-14 MED ORDER — METOCLOPRAMIDE HCL 5 MG PO TABS: 5.0000 mg | ORAL_TABLET | Freq: Three times a day (TID) | ORAL | Status: DC | PRN

## 2018-07-14 MED ORDER — MIDAZOLAM HCL 2 MG/2ML IJ SOLN
2.0000 mg | Freq: Once | INTRAMUSCULAR | Status: AC
  Administered 2018-07-14: 2 mg via INTRAVENOUS

## 2018-07-14 MED ORDER — CEFAZOLIN SODIUM-DEXTROSE 2-4 GM/100ML-% IV SOLN
2.0000 g | INTRAVENOUS | Status: AC
  Administered 2018-07-14: 2 g via INTRAVENOUS
  Filled 2018-07-14: qty 100

## 2018-07-14 MED ORDER — METHOCARBAMOL 500 MG PO TABS
500.0000 mg | ORAL_TABLET | Freq: Four times a day (QID) | ORAL | Status: DC | PRN
  Administered 2018-07-14 – 2018-07-15 (×2): 500 mg via ORAL
  Filled 2018-07-14 (×2): qty 1

## 2018-07-14 MED ORDER — OXYCODONE HCL ER 10 MG PO T12A: 10.0000 mg | EXTENDED_RELEASE_TABLET | Freq: Two times a day (BID) | ORAL | 0 refills | Status: AC

## 2018-07-14 MED ORDER — ACETAMINOPHEN 500 MG PO TABS
1000.0000 mg | ORAL_TABLET | Freq: Four times a day (QID) | ORAL | Status: AC
  Administered 2018-07-14 – 2018-07-15 (×4): 1000 mg via ORAL
  Filled 2018-07-14 (×4): qty 2

## 2018-07-14 MED ORDER — DEXAMETHASONE SODIUM PHOSPHATE 10 MG/ML IJ SOLN
10.0000 mg | Freq: Once | INTRAMUSCULAR | Status: AC
  Administered 2018-07-15: 10 mg via INTRAVENOUS
  Filled 2018-07-14: qty 1

## 2018-07-14 MED ORDER — OXYCODONE HCL 5 MG PO TABS: 5.0000 mg | ORAL_TABLET | ORAL | 0 refills | Status: DC | PRN

## 2018-07-14 MED ORDER — CEFAZOLIN SODIUM-DEXTROSE 2-4 GM/100ML-% IV SOLN
2.0000 g | Freq: Four times a day (QID) | INTRAVENOUS | Status: AC
  Administered 2018-07-14 – 2018-07-15 (×3): 2 g via INTRAVENOUS
  Filled 2018-07-14 (×3): qty 100

## 2018-07-14 MED ORDER — SENNOSIDES-DOCUSATE SODIUM 8.6-50 MG PO TABS: 1.0000 | ORAL_TABLET | Freq: Every evening | ORAL | 1 refills | Status: DC | PRN

## 2018-07-14 MED ORDER — SORBITOL 70 % SOLN: 30.0000 mL | Freq: Every day | Status: DC | PRN

## 2018-07-14 MED ORDER — FENTANYL CITRATE (PF) 100 MCG/2ML IJ SOLN
25.0000 ug | INTRAMUSCULAR | Status: DC | PRN
  Administered 2018-07-14: 50 ug via INTRAVENOUS

## 2018-07-14 SURGICAL SUPPLY — 72 items
ALCOHOL ISOPROPYL (RUBBING) (MISCELLANEOUS) ×3 IMPLANT
BAG DECANTER FOR FLEXI CONT (MISCELLANEOUS) ×3 IMPLANT
BANDAGE ACE 4X5 VEL STRL LF (GAUZE/BANDAGES/DRESSINGS) ×3 IMPLANT
BANDAGE ESMARK 6X9 LF (GAUZE/BANDAGES/DRESSINGS) ×1 IMPLANT
BEARING TIBIAL INSERT 13MM (Insert) ×1 IMPLANT
BLADE SAW SGTL 13.0X1.19X90.0M (BLADE) ×3 IMPLANT
BNDG ELASTIC 6X10 VLCR STRL LF (GAUZE/BANDAGES/DRESSINGS) ×3 IMPLANT
BNDG ESMARK 6X9 LF (GAUZE/BANDAGES/DRESSINGS) ×3
CLOSURE STERI-STRIP 1/2X4 (GAUZE/BANDAGES/DRESSINGS) ×1
CLSR STERI-STRIP ANTIMIC 1/2X4 (GAUZE/BANDAGES/DRESSINGS) ×2 IMPLANT
COVER SURGICAL LIGHT HANDLE (MISCELLANEOUS) ×3 IMPLANT
COVER WAND RF STERILE (DRAPES) ×3 IMPLANT
CUFF TOURNIQUET SINGLE 34IN LL (TOURNIQUET CUFF) ×3 IMPLANT
DRAPE EXTREMITY T 121X128X90 (DRAPE) ×3 IMPLANT
DRAPE HALF SHEET 40X57 (DRAPES) ×3 IMPLANT
DRAPE INCISE IOBAN 66X45 STRL (DRAPES) ×3 IMPLANT
DRAPE ORTHO SPLIT 77X108 STRL (DRAPES) ×4
DRAPE POUCH INSTRU U-SHP 10X18 (DRAPES) ×3 IMPLANT
DRAPE SURG ORHT 6 SPLT 77X108 (DRAPES) ×2 IMPLANT
DRAPE U-SHAPE 47X51 STRL (DRAPES) ×6 IMPLANT
DRSG PAD ABDOMINAL 8X10 ST (GAUZE/BANDAGES/DRESSINGS) ×6 IMPLANT
DURAPREP 26ML APPLICATOR (WOUND CARE) ×9 IMPLANT
ELECT CAUTERY BLADE 6.4 (BLADE) ×3 IMPLANT
ELECT REM PT RETURN 9FT ADLT (ELECTROSURGICAL) ×3
ELECTRODE REM PT RTRN 9FT ADLT (ELECTROSURGICAL) ×1 IMPLANT
FEMORAL POSTERIOR SZ6 RT (Femur) ×1 IMPLANT
GAUZE SPONGE 4X4 12PLY STRL (GAUZE/BANDAGES/DRESSINGS) ×3 IMPLANT
GAUZE SPONGE 4X4 12PLY STRL LF (GAUZE/BANDAGES/DRESSINGS) ×3 IMPLANT
GLOVE BIOGEL PI IND STRL 7.0 (GLOVE) ×1 IMPLANT
GLOVE BIOGEL PI INDICATOR 7.0 (GLOVE) ×2
GLOVE ECLIPSE 7.0 STRL STRAW (GLOVE) ×15 IMPLANT
GLOVE SKINSENSE NS SZ7.5 (GLOVE) ×2
GLOVE SKINSENSE STRL SZ7.5 (GLOVE) ×1 IMPLANT
GLOVE SURG SYN 7.5  E (GLOVE) ×8
GLOVE SURG SYN 7.5 E (GLOVE) ×4 IMPLANT
GOWN STRL REIN XL XLG (GOWN DISPOSABLE) ×3 IMPLANT
GOWN STRL REUS W/ TWL LRG LVL3 (GOWN DISPOSABLE) ×2 IMPLANT
GOWN STRL REUS W/TWL LRG LVL3 (GOWN DISPOSABLE) ×4
HANDPIECE INTERPULSE COAX TIP (DISPOSABLE) ×2
HOOD PEEL AWAY FLYTE STAYCOOL (MISCELLANEOUS) ×6 IMPLANT
KIT BASIN OR (CUSTOM PROCEDURE TRAY) ×3 IMPLANT
KIT TURNOVER KIT B (KITS) ×3 IMPLANT
KNEE PATELLA ASYMMETRIC 10X35 (Knees) ×3 IMPLANT
KNEE TIBIAL COMPONENT SZ6 (Knees) ×3 IMPLANT
MANIFOLD NEPTUNE II (INSTRUMENTS) ×3 IMPLANT
MARKER SKIN DUAL TIP RULER LAB (MISCELLANEOUS) ×3 IMPLANT
NEEDLE SPNL 18GX3.5 QUINCKE PK (NEEDLE) ×6 IMPLANT
NS IRRIG 1000ML POUR BTL (IV SOLUTION) ×3 IMPLANT
PACK TOTAL JOINT (CUSTOM PROCEDURE TRAY) ×3 IMPLANT
PAD ABD 8X10 STRL (GAUZE/BANDAGES/DRESSINGS) ×6 IMPLANT
PAD ARMBOARD 7.5X6 YLW CONV (MISCELLANEOUS) ×6 IMPLANT
PAD CAST 4YDX4 CTTN HI CHSV (CAST SUPPLIES) ×2 IMPLANT
PADDING CAST COTTON 4X4 STRL (CAST SUPPLIES) ×4
PADDING CAST COTTON 6X4 STRL (CAST SUPPLIES) ×3 IMPLANT
POSTERIOR FEMORAL SZ6 RT (Femur) ×3 IMPLANT
SAW OSC TIP CART 19.5X105X1.3 (SAW) ×3 IMPLANT
SET HNDPC FAN SPRY TIP SCT (DISPOSABLE) ×1 IMPLANT
SUCTION FRAZIER HANDLE 10FR (MISCELLANEOUS) ×2
SUCTION TUBE FRAZIER 10FR DISP (MISCELLANEOUS) ×1 IMPLANT
SUT ETHILON 2 0 FS 18 (SUTURE) IMPLANT
SUT MNCRL AB 4-0 PS2 18 (SUTURE) ×6 IMPLANT
SUT VIC AB 0 CT1 27 (SUTURE) ×4
SUT VIC AB 0 CT1 27XBRD ANBCTR (SUTURE) ×2 IMPLANT
SUT VIC AB 1 CTX 27 (SUTURE) ×9 IMPLANT
SUT VIC AB 2-0 CT1 27 (SUTURE) ×6
SUT VIC AB 2-0 CT1 TAPERPNT 27 (SUTURE) ×3 IMPLANT
SYR 50ML LL SCALE MARK (SYRINGE) ×6 IMPLANT
TIBIAL BEARING INSERT 13MM (Insert) ×3 IMPLANT
TOWEL OR 17X26 10 PK STRL BLUE (TOWEL DISPOSABLE) ×3 IMPLANT
TRAY CATH 16FR W/PLASTIC CATH (SET/KITS/TRAYS/PACK) ×3 IMPLANT
UNDERPAD 30X30 (UNDERPADS AND DIAPERS) ×3 IMPLANT
WRAP KNEE MAXI GEL POST OP (GAUZE/BANDAGES/DRESSINGS) ×3 IMPLANT

## 2018-07-14 NOTE — Progress Notes (Signed)
Orthopedic Tech Progress Note Patient Details:  Charles [name] [birth year] [id number]  CPM Right Knee CPM Right Knee: On Right Knee Flexion (Degrees): 90 Right Knee Extension (Degrees): 0 Additional Comments: trapeze bar patient helper  Post Interventions Patient Tolerated: Well Instructions Provided: Care of device Viewed order from doctor's order list Nikki Dom 07/14/2018, 1:43 PM

## 2018-07-14 NOTE — Discharge Instructions (Signed)

## 2018-07-14 NOTE — Plan of Care (Signed)

## 2018-07-14 NOTE — Evaluation (Signed)
Physical Therapy Evaluation Patient Details Name: Charles [name] MRN: [id number] DOB: 04-06-1961 Today's Date: 07/14/2018   History of Present Illness  Pt is a 57 y/o male s/p elective R TKA. PMH includes asthma, COPD, HTN, CHF, gout, hep C, and L THA.   Clinical Impression  Pt is s/p surgery above with deficits below. Pt very limited by pain and requesting to return to bed upon entry. Required min to mod A to perform stand pivot transfer using RW. Pt with very limited weight shift to RLE despite cues for WBAT on RLE. Educated about knee precautions and supine HEP. Will continue to follow acutely to maximize functional mobility independence and safety.     Follow Up Recommendations Follow surgeon's recommendation for DC plan and follow-up therapies;Supervision for mobility/OOB(per pt going to SNF )    Equipment Recommendations  Rolling walker with 5" wheels    Recommendations for Other Services       Precautions / Restrictions Precautions Precautions: Knee Precaution Booklet Issued: Yes (comment) Precaution Comments: Reviewed knee precautions and supine HEP.  Restrictions Weight Bearing Restrictions: Yes RLE Weight Bearing: Weight bearing as tolerated      Mobility  Bed Mobility Overal bed mobility: Needs Assistance Bed Mobility: Sit to Supine       Sit to supine: Supervision   General bed mobility comments: Supervision for safety. Educated about use of hook technique using LLE to assist with lifting RLE back to bed.   Transfers Overall transfer level: Needs assistance Equipment used: Rolling walker (2 wheeled) Transfers: Sit to/from [employer] Sit to Stand: Mod assist Stand pivot transfers: Min assist       General transfer comment: Mod A for lift assist and steadying. Pt avoiding putting weight on RLE despite cues for appropriate weightbearing status. Min A to perform transfer from chair to bed as pt requesting to get back to bed secondary to pain. Hop  to pattern used despite cues for WBAT on RLE.   Ambulation/Gait             General Gait Details: Unable   Stairs            Wheelchair Mobility    Modified Rankin (Stroke Patients Only)       Balance Overall balance assessment: Needs assistance Sitting-balance support: No upper extremity supported;Feet supported Sitting balance-Leahy Scale: Fair     Standing balance support: Bilateral upper extremity supported;During functional activity Standing balance-Leahy Scale: Poor Standing balance comment: Heavy reliance on UEs                              Pertinent Vitals/Pain Pain Assessment: Faces Faces Pain Scale: Hurts whole lot Pain Location: R knee  Pain Descriptors / Indicators: Operative site guarding;Grimacing;Guarding;Other (Comment)(yelling ) Pain Intervention(s): Limited activity within patient's tolerance;Monitored during session;Repositioned;RN gave pain meds during session    Home Living Family/patient expects to be discharged to:: Skilled nursing facility                 Additional Comments: Wants to go back to [facility]    Prior Function Level of Independence: Independent with assistive device(s)         Comments: Ambulates with cane      Hand Dominance        Extremity/Trunk Assessment   Upper Extremity Assessment Upper Extremity Assessment: Overall WFL for tasks assessed    Lower Extremity Assessment Lower Extremity Assessment: RLE deficits/detail RLE Deficits /  Details: Limited ROM secondary to post op pain and weakness. Reports some numbness in foot.     Cervical / Trunk Assessment Cervical / Trunk Assessment: Normal  Communication   Communication: No difficulties  Cognition Arousal/Alertness: Awake/alert Behavior During Therapy: WFL for tasks assessed/performed Overall Cognitive Status: No family/caregiver present to determine baseline cognitive functioning                                         General Comments      Exercises Total Joint Exercises Ankle Circles/Pumps: AROM;Right;10 reps Quad Sets: AROM;Right;10 reps   Assessment/Plan    PT Assessment Patient needs continued PT services  PT Problem List Decreased strength;Decreased activity tolerance;Decreased balance;Decreased mobility;Decreased knowledge of use of DME;Decreased knowledge of precautions;Pain       PT Treatment Interventions DME instruction;Gait training;Functional mobility training;Therapeutic activities;Therapeutic exercise;Balance training;Patient/family education    PT Goals (Current goals can be found in the Care Plan section)  Acute Rehab PT Goals Patient Stated Goal: to decrease pain  PT Goal Formulation: With patient Time For Goal Achievement: 07/28/18 Potential to Achieve Goals: Good    Frequency 7X/week   Barriers to discharge Decreased caregiver support      Co-evaluation               AM-PAC PT "6 Clicks" Daily Activity  Outcome Measure Difficulty turning over in bed (including adjusting bedclothes, sheets and blankets)?: A Lot Difficulty moving from lying on back to sitting on the side of the bed? : Unable Difficulty sitting down on and standing up from a chair with arms (e.g., wheelchair, bedside commode, etc,.)?: Unable Help needed moving to and from a bed to chair (including a wheelchair)?: A Little Help needed walking in hospital room?: A Lot Help needed climbing 3-5 steps with a railing? : A Lot 6 Click Score: 11    End of Session   Activity Tolerance: Patient limited by pain Patient left: in bed;with call bell/phone within reach;with bed alarm set Nurse Communication: Mobility status PT Visit Diagnosis: Unsteadiness on feet (R26.81);Muscle weakness (generalized) (M62.81);Pain Pain - Right/Left: Right Pain - part of body: Knee    Time: 1610-9604 PT Time Calculation (min) (ACUTE ONLY): 17 min   Charges:   PT Evaluation $PT Eval Low Complexity: 1  Low          Gladys Damme, PT, DPT  Acute Rehabilitation Services  Pager: [phone] Office: [phone]   Lehman Prom 07/14/2018, 6:28 PM

## 2018-07-14 NOTE — Anesthesia Procedure Notes (Signed)
Anesthesia Regional Block: Adductor canal block   Pre-Anesthetic Checklist: ,, timeout performed, Correct Patient, Correct Site, Correct Laterality, Correct Procedure, Correct Position, site marked, Risks and benefits discussed,  Surgical consent,  Pre-op evaluation,  At surgeon's request and post-op pain management  Laterality: Right  Prep: chloraprep       Needles:  Injection technique: Single-shot  Needle Type: Echogenic Needle     Needle Length: 10cm  Needle Gauge: 21     Additional Needles:   Narrative:  Start time: 07/14/2018 9:30 AM End time: 07/14/2018 9:33 AM Injection made incrementally with aspirations every 5 mL.  Performed by: Personally  Anesthesiologist: Beryle Lathe, MD  Additional Notes: No pain on injection. No increased resistance to injection. Injection made in 5cc increments. Good needle visualization. Patient tolerated the procedure well.

## 2018-07-14 NOTE — Anesthesia Procedure Notes (Signed)
Procedure Name: MAC Date/Time: 07/14/2018 10:36 AM Performed by: Colin Benton, CRNA Pre-anesthesia Checklist: Patient identified, Emergency Drugs available, Suction available and Patient being monitored Patient Re-evaluated:Patient Re-evaluated prior to induction Oxygen Delivery Method: Simple face mask Induction Type: IV induction Placement Confirmation: positive ETCO2 Dental Injury: Teeth and Oropharynx as per pre-operative assessment

## 2018-07-14 NOTE — Anesthesia Procedure Notes (Signed)
Spinal  Patient location during procedure: OR Start time: 07/14/2018 10:29 AM End time: 07/14/2018 10:34 AM Staffing Anesthesiologist: Beryle Lathe, MD Performed: anesthesiologist  Preanesthetic Checklist Completed: patient identified, surgical consent, pre-op evaluation, timeout performed, IV checked, risks and benefits discussed and monitors and equipment checked Spinal Block Patient position: sitting Prep: DuraPrep Patient monitoring: heart rate, cardiac monitor, continuous pulse ox and blood pressure Approach: midline Location: L4-5 Injection technique: single-shot Needle Needle type: Pencan  Needle gauge: 24 G Additional Notes Consent was obtained prior to the procedure with all questions answered and concerns addressed. Risks including, but not limited to, bleeding, infection, nerve damage, paralysis, failed block, inadequate analgesia, allergic reaction, high spinal, itching, and headache were discussed and the patient wished to proceed. Functioning IV was confirmed and monitors were applied. Sterile prep and drape, including hand hygiene, mask, and sterile gloves were used. The patient was positioned and the spine was prepped. The skin was anesthetized with lidocaine. Free flow of clear CSF was obtained prior to injecting local anesthetic into the CSF. The spinal needle aspirated freely following injection. The needle was carefully withdrawn. The patient tolerated the procedure well.   Leslye Peer, MD

## 2018-07-14 NOTE — Op Note (Signed)
Total Knee Arthroplasty Procedure Note  Preoperative diagnosis: Right knee osteoarthritis  Postoperative diagnosis:same  Operative procedure: Right total knee arthroplasty. CPT [phone]  Surgeon: N. Glee Arvin, MD  Assist: Oneal Grout, PA-C; necessary for the timely completion of procedure and due to complexity of procedure.  Anesthesia: Spinal, regional  Tourniquet time: less than 60 mins  Implants used: [employer] Triatholon Uncemented Femur: PS 6 Tibia: 6 Patella: 35 mm Polyethylene: 13 mm  Indication: Charles [name] is a 57 y.o. year old male with a history of knee pain. Having failed conservative management, the patient elected to proceed with a total knee arthroplasty.  We have reviewed the risk and benefits of the surgery and they elected to proceed after voicing understanding.  Procedure:  After informed consent was obtained and understanding of the risk were voiced including but not limited to bleeding, infection, damage to surrounding structures including nerves and vessels, blood clots, leg length inequality and the failure to achieve desired results, the operative extremity was marked with verbal confirmation of the patient in the holding area.   The patient was then brought to the operating room and transported to the operating room table in the supine position.  A tourniquet was applied to the operative extremity around the upper thigh. The operative limb was then prepped and draped in the usual sterile fashion and preoperative antibiotics were administered.  A time out was performed prior to the start of surgery confirming the correct extremity, preoperative antibiotic administration, as well as team members, implants and instruments available for the case. Correct surgical site was also confirmed with preoperative radiographs. The limb was then elevated for exsanguination and the tourniquet was inflated. A midline incision was made and a standard medial parapatellar  approach was performed.  The patella was prepared and sized to a 35 mm.  A cover was placed on the patella for protection from retractors.  We then turned our attention to the femur. Posterior cruciate ligament was sacrificed. Start site was drilled in the femur and the intramedullary distal femoral cutting guide was placed, set at 5 degrees valgus, taking 9 mm of distal resection. The distal cut was made. Osteophytes were then removed. Next, the proximal tibial cutting guide was placed with appropriate slope, varus/valgus alignment and depth of resection. The proximal tibial cut was made. Gap blocks were then used to assess the extension gap and alignment, and appropriate soft tissue releases were performed. Attention was turned back to the femur, which was sized using the sizing guide to a size 6. Appropriate rotation of the femoral component was determined using epicondylar axis, Whiteside's line, and assessing the flexion gap under ligament tension. The appropriate size 4-in-1 cutting block was placed and cuts were made. Posterior femoral osteophytes and uncapped bone were then removed with the curved osteotome. The tibia was sized for a size 6 component. The femoral box-cutting guide was placed and prepared for a PS femoral component. Trial components were placed, and stability was checked in full extension, mid-flexion, and deep flexion. Proper tibial rotation was determined and marked.  The patella tracked well without a lateral release. Trial components were then removed and tibial preparation performed. A posterior capsular injection comprising of 20 cc of 1.3% exparel and 40 cc of normal saline was performed for postoperative pain control. The bony surfaces were irrigated with a pulse lavage and then dried. The final components sized above were malleted into place. The stability of the construct was re-evaluated throughout a range of motion  and found to be acceptable. The trial liner was removed, the  knee was copiously irrigated, and the knee was re-evaluated for any excess bone debris. The real polyethylene liner, 13 mm thick, was inserted and checked to ensure the locking mechanism had engaged appropriately. The tourniquet was deflated and hemostasis was achieved. The wound was irrigated with normal saline.  One gram of vancomycin powder was placed in the surgical bed.  A drain was not placed.  Capsular closure was performed with a #1 vicryl, subcutaneous fat closed with a 2.0 vicryl suture, then subcutaneous tissue closed with interrupted 2.0 vicryl suture. The skin was then closed with a 3.0 monocryl. A sterile dressing was applied.  The patient was awakened in the operating room and taken to recovery in stable condition. All sponge, needle, and instrument counts were correct at the end of the case.  Position: supine  Complications: none.  Time Out: performed   Drains/Packing: none  Estimated blood loss: minimal  Returned to Recovery Room: in good condition.   Antibiotics: yes   Mechanical VTE (DVT) Prophylaxis: sequential compression devices, TED thigh-high  Chemical VTE (DVT) Prophylaxis: aspirin  Fluid Replacement  Crystalloid: see anesthesia record Blood: none  FFP: none   Specimens Removed: 1 to pathology   Sponge and Instrument Count Correct? yes   PACU: portable radiograph - knee AP and Lateral   Admission: inpatient status  Plan/RTC: Return in 2 weeks for wound check.   Weight Bearing/Load Lower Extremity: full   N. Glee Arvin, MD [facility] [phone] 11:55 AM

## 2018-07-14 NOTE — Anesthesia Preprocedure Evaluation (Addendum)
Anesthesia Evaluation  Patient identified by MRN, date of birth, ID band Patient awake    Reviewed: Allergy & Precautions, NPO status , Patient's Chart, lab work & pertinent test results  History of Anesthesia Complications Negative for: history of anesthetic complications  Airway Mallampati: II  TM Distance: >3 FB Neck ROM: Full    Dental  (+) Dental Advisory Given, Missing   Pulmonary asthma , COPD,  COPD inhaler, Current Smoker,    breath sounds clear to auscultation       Cardiovascular hypertension, Pt. on medications +CHF   Rhythm:Regular Rate:Normal   '19 TTE - EF 55% to 60%. Grade 1 diastolic dysfunction.     Neuro/Psych  Headaches, negative psych ROS   GI/Hepatic GERD  Medicated and Controlled,(+) Hepatitis -, C  Endo/Other  negative endocrine ROS  Renal/GU negative Renal ROS  negative genitourinary   Musculoskeletal  (+) Arthritis ,  Gout    Abdominal   Peds  Hematology  (+) anemia ,   Anesthesia Other Findings Syphilis   Reproductive/Obstetrics                            Anesthesia Physical Anesthesia Plan  ASA: III  Anesthesia Plan: Spinal   Post-op Pain Management:  Regional for Post-op pain   Induction:   PONV Risk Score and Plan: 1 and Treatment may vary due to age or medical condition and Propofol infusion  Airway Management Planned: Natural Airway and Simple Face Mask  Additional Equipment: None  Intra-op Plan:   Post-operative Plan:   Informed Consent: I have reviewed the patients History and Physical, chart, labs and discussed the procedure including the risks, benefits and alternatives for the proposed anesthesia with the patient or authorized representative who has indicated his/her understanding and acceptance.     Plan Discussed with: CRNA and Anesthesiologist  Anesthesia Plan Comments: (Labs reviewed, platelets acceptable. Discussed risks and  benefits of spinal, including spinal/epidural hematoma, infection, failed block, and PDPH. Patient expressed understanding and wished to proceed. )       Anesthesia Quick Evaluation

## 2018-07-14 NOTE — H&P (Signed)
PREOPERATIVE H&P  Chief Complaint: right knee degenerative joint disease  HPI: Charles [name] is a 57 y.o. male who presents for surgical treatment of right knee degenerative joint disease.  He denies any changes in medical history.  Past Medical History:  Diagnosis Date  . Asthma   . 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 CHF (congestive heart failure) (HCC) 2018  . COPD (chronic obstructive pulmonary disease) (HCC)   . GERD (gastroesophageal reflux disease)   . Gout   . Gout   . Headache    headaches  . Hepatitis C virus carrier state (HCC)   . Hypertension   . Osteochondrosis   . Primary localized osteoarthritis of hip    Left   Past Surgical History:  Procedure Laterality Date  . TOTAL HIP ARTHROPLASTY Left 03/10/2018   Procedure: LEFT TOTAL HIP ARTHROPLASTY ANTERIOR APPROACH;  Surgeon: Tarry Kos, MD;  Location: [facility] OR;  Service: Orthopedics;  Laterality: Left;   Social History   Socioeconomic History  . Marital status: Single    Spouse name: Not on file  . Number of children: Not on file  . Years of education: Not on file  . Highest education level: Not on file  Occupational History  . Not on file  Social Needs  . Financial resource strain: Not on file  . Food insecurity:    Worry: Not on file    Inability: Not on file  . Transportation needs:    Medical: Not on file    Non-medical: Not on file  Tobacco Use  . Smoking status: Current Every Day Smoker    Packs/day: 0.20  . Smokeless tobacco: Never Used  . Tobacco comment: 3 -4 cigarettes a day  Substance and Sexual Activity  . Alcohol use: Not Currently    Alcohol/week: 7.0 standard drinks    Types: 7 Cans of beer per week    Comment: heavy drinker in the past  . Drug use: Not Currently    Types: Marijuana    Comment: 03/10/18 last smoked "a couple of weeks ago"  . Sexual activity: Never  Lifestyle  . Physical activity:    Days per week: Not on file    Minutes per session: Not on  file  . Stress: Not on file  Relationships  . Social connections:    Talks on phone: Not on file    Gets together: Not on file    Attends religious service: Not on file    Active member of club or organization: Not on file    Attends meetings of clubs or organizations: Not on file    Relationship status: Not on file  Other Topics Concern  . Not on file  Social History Narrative  . Not on file   Family History  Problem Relation Age of Onset  . Hypertension Mother   . Hypertension Sister        twin  . Seizures Brother   . Schizophrenia Sister    No Known Allergies Prior to Admission medications   Medication Sig Start Date End Date Taking? Authorizing Provider  albuterol (PROVENTIL HFA;VENTOLIN HFA) 108 (90 Base) MCG/ACT inhaler Inhale 2 puffs into the lungs every 4 (four) hours as needed for wheezing or shortness of breath (cough, shortness of breath or wheezing.). 05/14/18  Yes Kallie Locks, FNP  allopurinol (ZYLOPRIM) 100 MG tablet Take 1 tablet (100 mg total) by mouth 2 (two) times daily. 05/14/18  Yes  Kallie Locks, FNP  aspirin EC 81 MG tablet Take 1 tablet (81 mg total) by mouth 2 (two) times daily. 05/14/18  Yes Kallie Locks, FNP  budesonide-formoterol (SYMBICORT) 80-4.5 MCG/ACT inhaler Inhale 2 puffs into the lungs 2 (two) times daily. 05/14/18  Yes Kallie Locks, FNP  Carboxymethylcellul-Glycerin (REFRESH OPTIVE) 1-0.9 % GEL Place 1 drop into both eyes as needed.   Yes [provider]  cetirizine (ZYRTEC) 10 MG tablet Take 1 tablet (10 mg total) by mouth daily. 05/14/18  Yes Kallie Locks, FNP  Colchicine (MITIGARE) 0.6 MG CAPS Take 0.6 mg by mouth daily as needed (gout flare). 05/14/18  Yes Kallie Locks, FNP  Glecaprevir-Pibrentasvir 100-40 MG TABS Take 3 tablets by mouth at bedtime.   Yes [provider]  hydrochlorothiazide (HYDRODIURIL) 25 MG tablet Take 1 tablet (25 mg total) by mouth daily. 05/14/18  Yes Kallie Locks, FNP    ketoconazole (NIZORAL) 2 % cream Apply 2 application topically 2 (two) times daily. 05/14/18  Yes Kallie Locks, FNP  losartan (COZAAR) 50 MG tablet Take 1 tablet (50 mg total) by mouth daily. 05/14/18  Yes Kallie Locks, FNP  methocarbamol (ROBAXIN) 750 MG tablet Take 1 tablet (750 mg total) by mouth 2 (two) times daily as needed for muscle spasms. 05/14/18  Yes Kallie Locks, FNP  omeprazole (PRILOSEC) 40 MG capsule Take 1 capsule (40 mg total) by mouth daily. 05/14/18  Yes Kallie Locks, FNP  ondansetron (ZOFRAN) 4 MG tablet Take 1-2 tablets (4-8 mg total) by mouth every 8 (eight) hours as needed for nausea or vomiting. 03/10/18  Yes Tarry Kos, MD  promethazine (PHENERGAN) 25 MG tablet Take 1 tablet (25 mg total) by mouth every 6 (six) hours as needed for nausea. 03/10/18  Yes Tarry Kos, MD  senna-docusate (SENOKOT S) 8.6-50 MG tablet Take 1 tablet by mouth at bedtime as needed. 05/14/18  Yes Kallie Locks, FNP  SUMAtriptan (IMITREX) 100 MG tablet Take 100 mg by mouth every 2 (two) hours as needed for migraine. May repeat in 2 hours if headache persists or recurs.   Yes [provider]  tamsulosin (FLOMAX) 0.4 MG CAPS capsule Take 1 capsule (0.4 mg total) by mouth daily. 05/14/18  Yes Kallie Locks, FNP  traMADol (ULTRAM) 50 MG tablet Take 1 tablet (50 mg total) by mouth 3 (three) times daily as needed. 05/27/18  Yes Tarry Kos, MD  traZODone (DESYREL) 50 MG tablet Take 0.5-1 tablets (25-50 mg total) by mouth at bedtime as needed for sleep. 05/14/18  Yes Kallie Locks, FNP  oxyCODONE (OXY IR/ROXICODONE) 5 MG immediate release tablet Take 1-3 tablets (5-15 mg total) by mouth every 4 (four) hours as needed. Patient not taking: Reported on 07/08/2018 03/10/18   Tarry Kos, MD  potassium chloride (K-DUR) 10 MEQ tablet Take four pills po bid x 4 days for low potassium 07/08/18   Cristie Hem, PA-C     Positive ROS: All other systems have been reviewed and were  otherwise negative with the exception of those mentioned in the HPI and as above.  Physical Exam: General: Alert, no acute distress Cardiovascular: No pedal edema Respiratory: No cyanosis, no use of accessory musculature GI: abdomen soft Skin: No lesions in the area of chief complaint Neurologic: Sensation intact distally Psychiatric: Patient is competent for consent with normal mood and affect Lymphatic: no lymphedema  MUSCULOSKELETAL: exam stable  Assessment: right knee degenerative joint  disease  Plan: Plan for Procedure(s): RIGHT TOTAL KNEE ARTHROPLASTY  The risks benefits and alternatives were discussed with the patient including but not limited to the risks of nonoperative treatment, versus surgical intervention including infection, bleeding, nerve injury,  blood clots, cardiopulmonary complications, morbidity, mortality, among others, and they were willing to proceed.   Preoperative templating of the joint replacement has been completed, documented, and submitted to the Operating Room personnel in order to optimize intra-operative equipment management.  Anticipated LOS equal to or greater than 2 midnights due to - Age 44 and older with one or more of the following:  - Obesity  - Expected need for hospital services (PT, OT, Nursing) required for safe  discharge  - Anticipated need for postoperative skilled nursing care or inpatient rehab  - Active co-morbidities: None    Glee Arvin, MD 07/14/2018 7:24 AM

## 2018-07-14 NOTE — Transfer of Care (Signed)
Immediate Anesthesia Transfer of Care Note  Patient: Charles [name]  Procedure(s) Performed: RIGHT TOTAL KNEE ARTHROPLASTY (Right Knee)  Patient Location: PACU  Anesthesia Type:Spinal and MAC combined with regional for post-op pain  Level of Consciousness: drowsy and patient cooperative  Airway & Oxygen Therapy: Patient Spontanous Breathing and Patient connected to face mask oxygen  Post-op Assessment: Report given to RN and Post -op Vital signs reviewed and stable  Post vital signs: Reviewed and stable  Last Vitals:  Vitals Value Taken Time  BP 85/54 07/14/2018 12:33 PM  Temp    Pulse 88 07/14/2018 12:35 PM  Resp 15 07/14/2018 12:35 PM  SpO2 100 % 07/14/2018 12:35 PM  Vitals shown include unvalidated device data.  Last Pain:  Vitals:   07/14/18 0935  TempSrc:   PainSc: 0-No pain         Complications: No apparent anesthesia complications

## 2018-07-14 NOTE — Anesthesia Postprocedure Evaluation (Signed)
Anesthesia Post Note  Patient: Charles [name]  Procedure(s) Performed: RIGHT TOTAL KNEE ARTHROPLASTY (Right Knee)     Patient location during evaluation: PACU Anesthesia Type: Spinal Level of consciousness: awake and alert Pain management: pain level controlled Vital Signs Assessment: post-procedure vital signs reviewed and stable Respiratory status: spontaneous breathing and respiratory function stable Cardiovascular status: blood pressure returned to baseline and stable Postop Assessment: spinal receding and no apparent nausea or vomiting Anesthetic complications: no    Last Vitals:  Vitals:   07/14/18 1317 07/14/18 1332  BP: 135/89 137/89  Pulse: (!) 56 (!) 58  Resp: 11 10  Temp:    SpO2: 100% 100%    Last Pain:  Vitals:   07/14/18 1332  TempSrc:   PainSc: 0-No pain        RLE Motor Response: No movement due to regional block(spinal) (07/14/18 1332) RLE Sensation: No sensation (absent) (07/14/18 1332) L Sensory Level: L4-Anterior knee, lower leg (07/14/18 1332) R Sensory Level: L4-Anterior knee, lower leg (07/14/18 1332)  Beryle Lathe

## 2018-07-14 NOTE — Progress Notes (Signed)
Hepatitis C meds management:  Saw Charles [name] back in April of this year after he finished his first month of Mavyret. He was supposed to be on it for 8 wks but due to his confusion, he wanted to tx the med out. Therefore, he only got 4 wks of it. Several hep VLs were done and they have been neg. The one in Sept would be his SVR12. It means that he is now cured of hep C.   Charles [name], PharmD, [place], AAHIVP, CPP Infectious Disease Pharmacist Pager: [phone] 07/14/2018 5:41 PM

## 2018-07-15 ENCOUNTER — Encounter (HOSPITAL_COMMUNITY): Payer: Self-pay | Admitting: Orthopaedic Surgery

## 2018-07-15 LAB — BASIC METABOLIC PANEL
ANION GAP: 5 (ref 5–15)
BUN: 10 mg/dL (ref 6–20)
CALCIUM: 8.4 mg/dL — AB (ref 8.9–10.3)
CO2: 29 mmol/L (ref 22–32)
CREATININE: 1.04 mg/dL (ref 0.61–1.24)
Chloride: 104 mmol/L (ref 98–111)
Glucose, Bld: 119 mg/dL — ABNORMAL HIGH (ref 70–99)
Potassium: 3.3 mmol/L — ABNORMAL LOW (ref 3.5–5.1)
Sodium: 138 mmol/L (ref 135–145)

## 2018-07-15 LAB — CBC
HEMATOCRIT: 33.1 % — AB (ref 39.0–52.0)
Hemoglobin: 10.3 g/dL — ABNORMAL LOW (ref 13.0–17.0)
MCH: 24.3 pg — ABNORMAL LOW (ref 26.0–34.0)
MCHC: 31.1 g/dL (ref 30.0–36.0)
MCV: 78.3 fL — ABNORMAL LOW (ref 80.0–100.0)
PLATELETS: 277 10*3/uL (ref 150–400)
RBC: 4.23 MIL/uL (ref 4.22–5.81)
RDW: 18.6 % — AB (ref 11.5–15.5)
WBC: 7.3 10*3/uL (ref 4.0–10.5)

## 2018-07-15 MED ORDER — ASPIRIN EC 81 MG PO TBEC: 81.0000 mg | DELAYED_RELEASE_TABLET | Freq: Two times a day (BID) | ORAL | 0 refills | Status: DC

## 2018-07-15 NOTE — Clinical Social Work Note (Signed)
Clinical Social Work Assessment  Patient Details  Name: Charles [name] MRN: [id number] Date of Birth: 1961/08/04  Date of referral:  07/15/18               Reason for consult:  Discharge Planning                Permission sought to share information with:  Case Manager, Facility Medical sales representative Permission granted to share information::  Yes, Verbal Permission Granted  Name::        Agency::  SNFs  Relationship::  not granted  Contact Information:     Housing/Transportation Living arrangements for the past 2 months:  Single Family Home Source of Information:  Patient Patient Interpreter Needed:  None Criminal Activity/Legal Involvement Pertinent to Current Situation/Hospitalization:  No - Comment as needed Significant Relationships:  None Lives with:  Self Do you feel safe going back to the place where you live?  No Need for family participation in patient care:  No (Coment)  Care giving concerns:  CSW received referral for possible SNF placement at time of discharge. Spoke with patient regarding possibility of SNF placement .Patient expressed understanding of PT recommendation and are agreeable to SNF placement at time of discharge. CSW to continue to follow and assist with discharge planning needs.     Social Worker assessment / plan:  Spoke with patient concerning possibility of rehab at SNF before returning home.    Employment status:  Retired Health and safety inspector:  Medicaid [place] PT Recommendations:  Skilled Nursing Facility Information / Referral to community resources:  Skilled Nursing Facility  Patient/Family's Response to care:  Patient recognize need for rehab before returning home and are agreeable to a SNF in [place]. They report preference for  [facility] . CSW explained insurance authorization process.     Patient/Family's Understanding of and Emotional Response to Diagnosis, Current Treatment, and Prognosis:  Patient/family is realistic regarding  therapy needs and expressed being hopeful for SNF placement. Patient expressed understanding of CSW role and discharge process as well as medical condition. No questions/concerns about plan or treatment.    Emotional Assessment Appearance:  Appears stated age Attitude/Demeanor/Rapport:  Unable to Assess Affect (typically observed):  Unable to Assess Orientation:  Oriented to Self, Oriented to Situation, Oriented to Place, Oriented to  Time Alcohol / Substance use:  Not Applicable Psych involvement (Current and /or in the community):  No (Comment)  Discharge Needs  Concerns to be addressed:  Discharge Planning Concerns Readmission within the last 30 days:  No Current discharge risk:  Dependent with Mobility Barriers to Discharge:  Continued Medical Work up   [employer], LCSW 07/15/2018, 2:00 PM

## 2018-07-15 NOTE — Clinical Social Work Placement (Signed)
   CLINICAL SOCIAL WORK PLACEMENT  NOTE  Date:  07/15/2018  Patient Details  Name: Charles [name] MRN: [id number] Date of Birth: 03/08/1961  Clinical Social Work is seeking post-discharge placement for this patient at the Skilled  Nursing Facility level of care (*CSW will initial, date and re-position this form in  chart as items are completed):  Yes   Patient/family provided with [place] Clinical Social Work Department's list of facilities offering this level of care within the geographic area requested by the patient (or if unable, by the patient's family).  Yes   Patient/family informed of their freedom to choose among providers that offer the needed level of care, that participate in Medicare, Medicaid or managed care program needed by the patient, have an available bed and are willing to accept the patient.      Patient/family informed of 's ownership interest in [facility] and [facility], as well as of the fact that they are under no obligation to receive care at these facilities.  PASRR submitted to EDS on       PASRR number received on       Existing PASRR number confirmed on 07/15/18     FL2 transmitted to all facilities in geographic area requested by pt/family on 07/15/18     FL2 transmitted to all facilities within larger geographic area on       Patient informed that his/her managed care company has contracts with or will negotiate with certain facilities, including the following:        Yes   Patient/family informed of bed offers received.  Patient [facility] bed at [facility]     Physician recommends and patient chooses bed at      Patient to be transferred to [facility] on 07/15/18.  Patient to be transferred to facility by [facility]     Patient family notified on   of transfer.  Name of family member notified:  none identified     PHYSICIAN Please sign FL2     Additional Comment:     _______________________________________________ Gildardo Griffes, LCSW 07/15/2018, 2:06 PM

## 2018-07-15 NOTE — Progress Notes (Signed)
Called to give report to receiving nurse at [facility]. Was transferred to [facility] and no one answered.Will re-attempt later.

## 2018-07-15 NOTE — [place] FL2 (Signed)
[place] MEDICAID FL2 LEVEL OF CARE SCREENING TOOL     IDENTIFICATION  Patient Name: Charles [name] Birthdate: [birth year] Sex: male Admission Date (Current Location): 07/14/2018  [facility] and [place] Number:  Producer, television/film/video and Address:  [facility], [address] [place], [place] [address]      Provider Number: [id number]  Attending Physician Name and Address:  Tarry Kos, MD  Relative Name and Phone Number:  [place] [phone]    Current Level of Care: Hospital Recommended Level of Care: Skilled Nursing Facility Prior Approval Number:    Date Approved/Denied:   PASRR Number: [id number]  Discharge Plan: SNF    Current Diagnoses: Patient Active Problem List   Diagnosis Date Noted  . Total knee replacement status 07/14/2018  . Primary osteoarthritis of right knee   . Gout 05/26/2018  . Chronic pain of right knee 04/29/2018  . Surgery, elective 03/10/2018  . Status post hip replacement, left 03/10/2018  . Syphilis 01/01/2018  . Presbyopia 01/01/2018  . Chronic viral hepatitis C (HCC) 12/04/2017  . AVN (avascular necrosis of bone) (HCC) 12/04/2017  . Essential hypertension 07/20/2017  . CHF (congestive heart failure) (HCC) 07/20/2017  . Chronic obstructive pulmonary disease (HCC) 07/20/2017    Orientation RESPIRATION BLADDER Height & Weight     Self, Time, Situation, Place  Normal Continent Weight: 157 lb (71.2 kg) Height:  5\' 11"  (180.3 cm)  BEHAVIORAL SYMPTOMS/MOOD NEUROLOGICAL BOWEL NUTRITION STATUS      Continent Diet(see discharge summary)  AMBULATORY STATUS COMMUNICATION OF NEEDS Skin   Limited Assist Verbally Surgical wounds(right knee closed incision)                       Personal Care Assistance Level of Assistance  Bathing, Feeding, Dressing, Total care Bathing Assistance: Limited assistance Feeding assistance: Independent Dressing Assistance: Limited assistance Total Care Assistance: Limited assistance    Functional Limitations Info  Sight, Hearing, Speech Sight Info: Adequate Hearing Info: Adequate Speech Info: Adequate    SPECIAL CARE FACTORS FREQUENCY  PT (By licensed PT), OT (By licensed OT)     PT Frequency: 5x weekly OT Frequency: 5x weekly            Contractures Contractures Info: Not present    Additional Factors Info                  Current Medications (07/15/2018):  This is the current hospital active medication list Current Facility-Administered Medications  Medication Dose Route Frequency Provider Last Rate Last Dose  . acetaminophen (TYLENOL) tablet 1,000 mg  1,000 mg Oral Q6H Tarry Kos, MD   1,000 mg at 07/15/18 0603  . acetaminophen (TYLENOL) tablet 325-650 mg  325-650 mg Oral Q6H PRN Tarry Kos, MD      . albuterol (PROVENTIL) (2.5 MG/3ML) 0.083% nebulizer solution 2.5 mg  2.5 mg Inhalation Q4H PRN Tarry Kos, MD      . allopurinol (ZYLOPRIM) tablet 100 mg  100 mg Oral BID Tarry Kos, MD   100 mg at 07/15/18 1031  . alum & mag hydroxide-simeth (MAALOX/MYLANTA) 200-200-20 MG/5ML suspension 30 mL  30 mL Oral Q4H PRN Tarry Kos, MD      . aspirin chewable tablet 81 mg  81 mg Oral BID Tarry Kos, MD   81 mg at 07/15/18 1031  . celecoxib (CELEBREX) capsule 200 mg  200 mg Oral BID Tarry Kos, MD   200 mg  at 07/15/18 1031  . diphenhydrAMINE (BENADRYL) 12.5 MG/5ML elixir 25 mg  25 mg Oral Q4H PRN Tarry Kos, MD      . docusate sodium (COLACE) capsule 100 mg  100 mg Oral BID Tarry Kos, MD   100 mg at 07/15/18 1031  . gabapentin (NEURONTIN) capsule 300 mg  300 mg Oral TID Tarry Kos, MD   300 mg at 07/15/18 1031  . HYDROmorphone (DILAUDID) injection 0.5-1 mg  0.5-1 mg Intravenous Q4H PRN Tarry Kos, MD   1 mg at 07/15/18 0835  . ketorolac (TORADOL) 15 MG/ML injection 30 mg  30 mg Intravenous Q6H Tarry Kos, MD   30 mg at 07/15/18 0604  . losartan (COZAAR) tablet 50 mg  50 mg Oral Daily Tarry Kos, MD   50 mg at 07/15/18  1031  . magnesium citrate solution 1 Bottle  1 Bottle Oral Once PRN Tarry Kos, MD      . menthol-cetylpyridinium (CEPACOL) lozenge 3 mg  1 lozenge Oral PRN Tarry Kos, MD       Or  . phenol (CHLORASEPTIC) mouth spray 1 spray  1 spray Mouth/Throat PRN Tarry Kos, MD      . methocarbamol (ROBAXIN) tablet 500 mg  500 mg Oral Q6H PRN Tarry Kos, MD   500 mg at 07/15/18 0422   Or  . methocarbamol (ROBAXIN) 500 mg in dextrose 5 % 50 mL IVPB  500 mg Intravenous Q6H PRN Tarry Kos, MD      . metoCLOPramide (REGLAN) tablet 5-10 mg  5-10 mg Oral Q8H PRN Tarry Kos, MD       Or  . metoCLOPramide (REGLAN) injection 5-10 mg  5-10 mg Intravenous Q8H PRN Tarry Kos, MD      . mometasone-formoterol [facility]) 100-5 MCG/ACT inhaler 2 puff  2 puff Inhalation BID Tarry Kos, MD   2 puff at 07/15/18 0905  . ondansetron (ZOFRAN) tablet 4 mg  4 mg Oral Q6H PRN Tarry Kos, MD       Or  . ondansetron [facility]) injection 4 mg  4 mg Intravenous Q6H PRN Tarry Kos, MD      . oxyCODONE (Oxy IR/ROXICODONE) immediate release tablet 10-15 mg  10-15 mg Oral Q4H PRN Tarry Kos, MD   15 mg at 07/14/18 2056  . oxyCODONE (Oxy IR/ROXICODONE) immediate release tablet 5-10 mg  5-10 mg Oral Q4H PRN Tarry Kos, MD   10 mg at 07/15/18 0422  . oxyCODONE (OXYCONTIN) 12 hr tablet 10 mg  10 mg Oral Q12H Tarry Kos, MD   10 mg at 07/15/18 1031  . polyethylene glycol (MIRALAX / GLYCOLAX) packet 17 g  17 g Oral Daily PRN Tarry Kos, MD      . polyvinyl alcohol (LIQUIFILM TEARS) 1.4 % ophthalmic solution 1 drop  1 drop Both Eyes PRN Tarry Kos, MD      . sorbitol 70 % solution 30 mL  30 mL Oral Daily PRN Tarry Kos, MD      . SUMAtriptan (IMITREX) tablet 100 mg  100 mg Oral Q2H PRN Tarry Kos, MD      . tamsulosin (FLOMAX) capsule 0.4 mg  0.4 mg Oral Daily Tarry Kos, MD   0.4 mg at 07/15/18 1032  . tranexamic acid (CYKLOKAPRON) 1,000 mg in sodium chloride 0.9 % 100 mL IVPB  1,000 mg  Intravenous Once Tarry Kos, MD      .  traZODone (DESYREL) tablet 25-50 mg  25-50 mg Oral QHS PRN Tarry Kos, MD         Discharge Medications: Please see discharge summary for a list of discharge medications.  Relevant Imaging Results:  Relevant Lab Results:   Additional Information SSN: [id number]  Gildardo Griffes, LCSW

## 2018-07-15 NOTE — Progress Notes (Signed)
Patient will DC to: [facility] Anticipated DC date: 07/15/18 Family notified: Patient identified no family to be notified at this time. Transport by: Sharin Mons  Per MD patient ready for DC to [facility]. RN, patient,  and facility notified of DC. Discharge Summary sent to facility. RN given number for report [phone]. DC packet on chart. Ambulance transport requested for patient.  CSW signing off.  [place], [place] [phone]

## 2018-07-15 NOTE — Progress Notes (Signed)
Physical Therapy Treatment Patient Details Name: Charles [name] MRN: [id number] DOB: [birth year] Today's Date: 07/15/2018    History of Present Illness Pt is a 57 y/o male s/p elective R TKA. PMH includes asthma, COPD, HTN, CHF, gout, hep C, and L THA.     PT Comments    Session limited by pending d/c to SNF. RN present to change dressing and focus of session was assisting pt with dressing and reviewing HEP. Pt was able to don clothes with min assist, mainly for set up. Pt declining OOB until transport arrives, so unfortunately gait training has not been initiated yet. Pt anticipates d/c to SNF this afternoon. Will continue to follow and progress as able per POC.   Follow Up Recommendations  Follow surgeon's recommendation for DC plan and follow-up therapies     Equipment Recommendations  Rolling walker with 5" wheels    Recommendations for Other Services       Precautions / Restrictions Precautions Precautions: Knee Precaution Booklet Issued: Yes (comment) Precaution Comments: Reviewed knee precautions and supine HEP.  Restrictions Weight Bearing Restrictions: Yes RLE Weight Bearing: Weight bearing as tolerated    Mobility  Bed Mobility Overal bed mobility: Needs Assistance Bed Mobility: Supine to Sit     Supine to sit: Supervision     General bed mobility comments: Pt was able to transition to EOB without assistance. Increased time and effort required.  Transfers Overall transfer level: Needs assistance Equipment used: Rolling walker (2 wheeled) Transfers: Sit to/from Stand Sit to Stand: Supervision         General transfer comment: VC's for hand placement on seated surface for safety and to wait for RW to be in front of pt before attempting stand.   Ambulation/Gait             General Gait Details: Pt declined ambulation and reports he wants to stay in bed until ambulance arrives for transport to SNF.    Stairs             Wheelchair Mobility     Modified Rankin (Stroke Patients Only)       Balance Overall balance assessment: Needs assistance Sitting-balance support: Feet supported;No upper extremity supported Sitting balance-Leahy Scale: Good     Standing balance support: No upper extremity supported;During functional activity Standing balance-Leahy Scale: Fair Standing balance comment: Standing statically                             Cognition Arousal/Alertness: Awake/alert Behavior During Therapy: WFL for tasks assessed/performed Overall Cognitive Status: Within Functional Limits for tasks assessed                                        Exercises Total Joint Exercises Ankle Circles/Pumps: 10 reps Heel Slides: 15 reps Straight Leg Raises: 10 reps    General Comments        Pertinent Vitals/Pain Pain Assessment: Faces Faces Pain Scale: Hurts little more Pain Location: R knee  Pain Descriptors / Indicators: Operative site guarding;Grimacing Pain Intervention(s): Limited activity within patient's tolerance;Monitored during session;Repositioned    Home Living                      Prior Function            PT Goals (current goals can now be found in the  care plan section) Acute Rehab PT Goals Patient Stated Goal: Not walk with a walker PT Goal Formulation: With patient Time For Goal Achievement: 07/28/18 Potential to Achieve Goals: Good Progress towards PT goals: Progressing toward goals    Frequency    7X/week      PT Plan Current plan remains appropriate    Co-evaluation              AM-PAC PT "6 Clicks" Daily Activity  Outcome Measure  Difficulty turning over in bed (including adjusting bedclothes, sheets and blankets)?: A Lot Difficulty moving from lying on back to sitting on the side of the bed? : Unable Difficulty sitting down on and standing up from a chair with arms (e.g., wheelchair, bedside commode, etc,.)?: Unable Help needed moving to  and from a bed to chair (including a wheelchair)?: A Little Help needed walking in hospital room?: A Lot Help needed climbing 3-5 steps with a railing? : A Lot 6 Click Score: 11    End of Session Equipment Utilized During Treatment: Gait belt Activity Tolerance: Patient tolerated treatment well Patient left: in bed;with call bell/phone within reach Nurse Communication: Other (comment)(Present during session) PT Visit Diagnosis: Unsteadiness on feet (R26.81);Muscle weakness (generalized) (M62.81);Pain Pain - Right/Left: Right Pain - part of body: Knee     Time: 9604-5409 PT Time Calculation (min) (ACUTE ONLY): 29 min  Charges:  $Therapeutic Exercise: 8-22 mins $Therapeutic Activity: 8-22 mins                     Conni Slipper, PT, DPT Acute Rehabilitation Services Pager: [phone] Office: [phone]    Marylynn Pearson 07/15/2018, 2:40 PM

## 2018-07-15 NOTE — Discharge Summary (Signed)
Physician Discharge Summary      Patient ID: Charles [name] MRN: [id number] DOB/AGE: 06/23/1961 57 y.o.  Admit date: 07/14/2018 Discharge date: 07/15/2018  Admission Diagnoses:  <principal problem not specified>  Discharge Diagnoses:  Active Problems:   Primary osteoarthritis of right knee   Total knee replacement status   Past Medical History:  Diagnosis Date  . Asthma   . 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 CHF (congestive heart failure) (HCC) 2018  . COPD (chronic obstructive pulmonary disease) (HCC)   . GERD (gastroesophageal reflux disease)   . Gout   . Gout   . Headache    headaches  . Hepatitis C virus carrier state (HCC)   . Hypertension   . Osteochondrosis   . Primary localized osteoarthritis of hip    Left    Surgeries: Procedure(s): RIGHT TOTAL KNEE ARTHROPLASTY on 07/14/2018   Consultants (if any):   Discharged Condition: Improved  Hospital Course: Charles [name] is an 57 y.o. male who was admitted 07/14/2018 with a diagnosis of <principal problem not specified> and went to the operating room on 07/14/2018 and underwent the above named procedures.    He was given perioperative antibiotics:  Anti-infectives (From admission, onward)   Start     Dose/Rate Route Frequency Ordered Stop   07/14/18 2200  Glecaprevir-Pibrentasvir 100-40 MG TABS 3 tablet  Status:  Discontinued     3 tablet Oral Daily at bedtime 07/14/18 1618 07/14/18 1737   07/14/18 1800  ceFAZolin (ANCEF) IVPB 2g/100 mL premix     2 g 200 mL/hr over 30 Minutes Intravenous Every 6 hours 07/14/18 1618 07/15/18 0634   07/14/18 1107  vancomycin (VANCOCIN) powder  Status:  Discontinued       As needed 07/14/18 1108 07/14/18 1228   07/14/18 1015  ceFAZolin (ANCEF) IVPB 2g/100 mL premix     2 g 200 mL/hr over 30 Minutes Intravenous On call to O.R. 07/14/18 7846 07/14/18 1027    .  He was given sequential compression devices, early ambulation, and aspirin for DVT prophylaxis.  He  benefited maximally from the hospital stay and there were no complications.    Recent vital signs:  Vitals:   07/15/18 0905 07/15/18 0912  BP:  (!) 145/83  Pulse:  92  Resp:    Temp:  98.3 F (36.8 C)  SpO2: 98% 99%    Recent laboratory studies:  Lab Results  Component Value Date   HGB 10.3 (L) 07/15/2018   HGB 11.6 (L) 07/08/2018   HGB 9.3 (L) 03/11/2018   Lab Results  Component Value Date   WBC 7.3 07/15/2018   PLT 277 07/15/2018   Lab Results  Component Value Date   INR 1.06 07/08/2018   Lab Results  Component Value Date   NA 138 07/15/2018   K 3.3 (L) 07/15/2018   CL 104 07/15/2018   CO2 29 07/15/2018   BUN 10 07/15/2018   CREATININE 1.04 07/15/2018   GLUCOSE 119 (H) 07/15/2018    Discharge Medications:   Allergies as of 07/15/2018   No Known Allergies     Medication List    STOP taking these medications   potassium chloride 10 MEQ tablet Commonly known as:  K-DUR   traMADol 50 MG tablet Commonly known as:  ULTRAM     TAKE these medications   albuterol 108 (90 Base) MCG/ACT inhaler Commonly known as:  PROVENTIL HFA;VENTOLIN HFA Inhale 2 puffs into the lungs every 4 (four)  hours as needed for wheezing or shortness of breath (cough, shortness of breath or wheezing.).   allopurinol 100 MG tablet Commonly known as:  ZYLOPRIM Take 1 tablet (100 mg total) by mouth 2 (two) times daily.   aspirin EC 81 MG tablet Take 1 tablet (81 mg total) by mouth 2 (two) times daily. What changed:  Another medication with the same name was added. Make sure you understand how and when to take each.   aspirin EC 81 MG tablet Take 1 tablet (81 mg total) by mouth 2 (two) times daily. What changed:  You were already taking a medication with the same name, and this prescription was added. Make sure you understand how and when to take each.   budesonide-formoterol 80-4.5 MCG/ACT inhaler Commonly known as:  SYMBICORT Inhale 2 puffs into the lungs 2 (two) times daily.     cetirizine 10 MG tablet Commonly known as:  ZYRTEC Take 1 tablet (10 mg total) by mouth daily.   Colchicine 0.6 MG Caps Take 0.6 mg by mouth daily as needed (gout flare).   Glecaprevir-Pibrentasvir 100-40 MG Tabs Take 3 tablets by mouth at bedtime.   hydrochlorothiazide 25 MG tablet Commonly known as:  HYDRODIURIL Take 1 tablet (25 mg total) by mouth daily.   ketoconazole 2 % cream Commonly known as:  NIZORAL Apply 2 application topically 2 (two) times daily.   losartan 50 MG tablet Commonly known as:  COZAAR Take 1 tablet (50 mg total) by mouth daily.   methocarbamol 750 MG tablet Commonly known as:  ROBAXIN Take 1 tablet (750 mg total) by mouth 2 (two) times daily as needed for muscle spasms.   omeprazole 40 MG capsule Commonly known as:  PRILOSEC Take 1 capsule (40 mg total) by mouth daily.   ondansetron 4 MG tablet Commonly known as:  ZOFRAN Take 1-2 tablets (4-8 mg total) by mouth every 8 (eight) hours as needed for nausea or vomiting.   oxyCODONE 5 MG immediate release tablet Commonly known as:  Oxy IR/ROXICODONE Take 1-3 tablets (5-15 mg total) by mouth every 4 (four) hours as needed. What changed:  Another medication with the same name was added. Make sure you understand how and when to take each.   oxyCODONE 10 mg 12 hr tablet Commonly known as:  OXYCONTIN Take 1 tablet (10 mg total) by mouth every 12 (twelve) hours for 3 days. What changed:  You were already taking a medication with the same name, and this prescription was added. Make sure you understand how and when to take each.   promethazine 25 MG tablet Commonly known as:  PHENERGAN Take 1 tablet (25 mg total) by mouth every 6 (six) hours as needed for nausea.   REFRESH OPTIVE 1-0.9 % Gel Generic drug:  Carboxymethylcellul-Glycerin Place 1 drop into both eyes as needed.   senna-docusate 8.6-50 MG tablet Commonly known as:  Senokot-S Take 1 tablet by mouth at bedtime as needed.   SUMAtriptan 100  MG tablet Commonly known as:  IMITREX Take 100 mg by mouth every 2 (two) hours as needed for migraine. May repeat in 2 hours if headache persists or recurs.   tamsulosin 0.4 MG Caps capsule Commonly known as:  FLOMAX Take 1 capsule (0.4 mg total) by mouth daily.   traZODone 50 MG tablet Commonly known as:  DESYREL Take 0.5-1 tablets (25-50 mg total) by mouth at bedtime as needed for sleep.            Durable Medical Equipment  (  From admission, onward)         Start     Ordered   07/14/18 1619  DME Walker rolling  Once    Question:  Patient needs a walker to treat with the following condition  Answer:  Total knee replacement status   07/14/18 1618   07/14/18 1619  DME 3 n 1  Once     07/14/18 1618   07/14/18 1619  DME Bedside commode  Once    Question:  Patient needs a bedside commode to treat with the following condition  Answer:  Total knee replacement status   07/14/18 1618          Diagnostic Studies: Dg Knee Right Port  Result Date: 07/14/2018 CLINICAL DATA:  Status post right total knee joint prosthesis placement. EXAM: PORTABLE RIGHT KNEE - 1-2 VIEW COMPARISON:  Preoperative views of the right knee of August 09, 2017 FINDINGS: The patient has undergone placement of a prosthetic right knee joint. Radiographic positioning of the prosthetic components is good. The interface with the native bone is normal. There is an air-fluid level in the anterior aspect of the soft tissues. IMPRESSION: No immediate postprocedure complication following right total knee joint prosthesis placement. Electronically Signed   By: David  [place] M.D.   On: 07/14/2018 13:05    Disposition: Discharge disposition: 03-Skilled Nursing Facility       Discharge Instructions    Call MD / Call 911   Complete by:  As directed    If you experience chest pain or shortness of breath, CALL 911 and be transported to the hospital emergency room.  If you develope a fever above 101.5 F, pus (white  drainage) or increased drainage or redness at the wound, or calf pain, call your surgeon's office.   Constipation Prevention   Complete by:  As directed    Drink plenty of fluids.  Prune juice may be helpful.  You may use a stool softener, such as Colace (over the counter) 100 mg twice a day.  Use MiraLax (over the counter) for constipation as needed.   Driving restrictions   Complete by:  As directed    No driving while taking narcotic pain meds.   Increase activity slowly as tolerated   Complete by:  As directed       Follow-up Information    Tarry Kos, MD In 2 weeks.   Specialty:  Orthopedic Surgery Why:  For suture removal, For wound re-check Contact information: [address] [place] [address] [phone]            Signed: Glee Arvin 07/15/2018, 1:52 PM

## 2018-07-15 NOTE — Progress Notes (Signed)
Pt given verbal discharge instruction. Written instructions in envelope for EMS staff upon arrival.

## 2018-07-15 NOTE — Plan of Care (Signed)

## 2018-07-15 NOTE — Progress Notes (Signed)
Subjective: 1 Day Post-Op Procedure(s) (LRB): RIGHT TOTAL KNEE ARTHROPLASTY (Right) Patient reports pain as mild.  Doing well this am.   Objective: Vital signs in last 24 hours: Temp:  [97.3 F (36.3 C)-98.7 F (37.1 C)] 98.4 F (36.9 C) (10/08 0512) Pulse Rate:  [56-96] 84 (10/08 0512) Resp:  [10-22] 20 (10/08 0512) BP: (85-144)/(54-107) 125/72 (10/08 0512) SpO2:  [95 %-100 %] 97 % (10/08 0512)  Intake/Output from previous day: 10/07 0701 - 10/08 0700 In: 4172.6 [P.O.:330; I.V.:2297.9; IV Piggyback:1444.7] Out: 1575 [Urine:1550; Blood:25] Intake/Output this shift: No intake/output data recorded.  Recent Labs    07/15/18 0519  HGB 10.3*   Recent Labs    07/15/18 0519  WBC 7.3  RBC 4.23  HCT 33.1*  PLT 277   Recent Labs    07/15/18 0519  NA 138  K 3.3*  CL 104  CO2 29  BUN 10  CREATININE 1.04  GLUCOSE 119*  CALCIUM 8.4*   No results for input(s): LABPT, INR in the last 72 hours.  Neurologically intact Neurovascular intact Sensation intact distally Intact pulses distally Dorsiflexion/Plantar flexion intact Incision: dressing C/D/I No cellulitis present Compartment soft  Anticipated LOS equal to or greater than 2 midnights due to - Age 59 and older with one or more of the following:  - Obesity  - Expected need for hospital services (PT, OT, Nursing) required for safe  discharge  - Anticipated need for postoperative skilled nursing care or inpatient rehab  - Active co-morbidities: Heart Failure and Respiratory Failure/COPD OR   - Unanticipated findings during/Post Surgery: Slow post-op progression: GI, pain control, mobility  - Patient is a high risk of re-admission due to: None   Assessment/Plan: 1 Day Post-Op Procedure(s) (LRB): RIGHT TOTAL KNEE ARTHROPLASTY (Right) Advance diet Up with therapy D/C IV fluids Discharge to SNF once insurance approves and bed available (patient requests [facility] if possible) WBAT RLE ABLA-mild and  stable    Cristie Hem 07/15/2018, 8:00 AM

## 2018-07-29 ENCOUNTER — Ambulatory Visit (INDEPENDENT_AMBULATORY_CARE_PROVIDER_SITE_OTHER): Payer: Medicaid Other | Admitting: Orthopaedic Surgery

## 2018-07-29 ENCOUNTER — Encounter (INDEPENDENT_AMBULATORY_CARE_PROVIDER_SITE_OTHER): Payer: Self-pay | Admitting: Orthopaedic Surgery

## 2018-07-29 DIAGNOSIS — Z96651 Presence of right artificial knee joint: Secondary | ICD-10-CM

## 2018-07-29 NOTE — Progress Notes (Signed)
Post-Op Visit Note   Patient: Charles [name]           Date of Birth: [birth year]           MRN: [id number] Visit Date: 07/29/2018 PCP: Kallie Locks, FNP   Assessment & Plan:  Chief Complaint:  Chief Complaint  Patient presents with  . Right Knee - Routine Post Op   Visit Diagnoses:  1. Status post right knee replacement     Plan: [name] is 2-week status post right total knee replacement.  He is still at the rehab facility.  He is doing well.  He still has some surgical pain.  His surgical incision is fully healed.  There is no signs of infection.  Expected postoperative swelling.  Range of motion is 0 to 85 degrees.  Overall he is doing quite well from my standpoint.  I would like to see him back in 4 more weeks.  In the meantime continue with aspirin twice daily for DVT prophylaxis and physical therapy for range of motion strengthening.  He will need three-view x-rays of the right knee on return.  Follow-Up Instructions: Return in about 4 weeks (around 08/26/2018).   Orders:  No orders of the defined types were placed in this encounter.  No orders of the defined types were placed in this encounter.   Imaging: No results found.  PMFS History: Patient Active Problem List   Diagnosis Date Noted  . Total knee replacement status 07/14/2018  . Primary osteoarthritis of right knee   . Gout 05/26/2018  . Chronic pain of right knee 04/29/2018  . Surgery, elective 03/10/2018  . Status post hip replacement, left 03/10/2018  . Syphilis 01/01/2018  . Presbyopia 01/01/2018  . Chronic viral hepatitis C (HCC) 12/04/2017  . AVN (avascular necrosis of bone) (HCC) 12/04/2017  . Essential hypertension 07/20/2017  . CHF (congestive heart failure) (HCC) 07/20/2017  . Chronic obstructive pulmonary disease (HCC) 07/20/2017   Past Medical History:  Diagnosis Date  . Asthma   . 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 CHF (congestive heart failure) (HCC) 2018  . COPD  (chronic obstructive pulmonary disease) (HCC)   . GERD (gastroesophageal reflux disease)   . Gout   . Gout   . Headache    headaches  . Hepatitis C virus carrier state (HCC)   . Hypertension   . Osteochondrosis   . Primary localized osteoarthritis of hip    Left    Family History  Problem Relation Age of Onset  . Hypertension Mother   . Hypertension Sister        twin  . Seizures Brother   . Schizophrenia Sister     Past Surgical History:  Procedure Laterality Date  . TOTAL HIP ARTHROPLASTY Left 03/10/2018   Procedure: LEFT TOTAL HIP ARTHROPLASTY ANTERIOR APPROACH;  Surgeon: Tarry Kos, MD;  Location: [facility] OR;  Service: Orthopedics;  Laterality: Left;  . TOTAL KNEE ARTHROPLASTY Right 07/14/2018   Procedure: RIGHT TOTAL KNEE ARTHROPLASTY;  Surgeon: Tarry Kos, MD;  Location: [facility] OR;  Service: Orthopedics;  Laterality: Right;   Social History   Occupational History  . Not on file  Tobacco Use  . Smoking status: Current Every Day Smoker    Packs/day: 0.20  . Smokeless tobacco: Never Used  . Tobacco comment: 3 -4 cigarettes a day  Substance and Sexual Activity  . Alcohol use: Not Currently    Alcohol/week: 7.0 standard drinks  Types: 7 Cans of beer per week    Comment: heavy drinker in the past  . Drug use: Yes    Types: Marijuana    Comment: 07/14/2018 - last dose 07/12/2018  . Sexual activity: Never

## 2018-08-13 IMAGING — US US ABDOMEN COMPLETE W/ ELASTOGRAPHY
1 series · 13 of 13 positions shown · non-contrast
Comparison: None.

CLINICAL DATA: Chronic hepatitis-C without hepatic coma.



[Series 1: us abdomen complete w/ elastography · 0.14mm/px · 13 of 13 slices shown]
[im 1/13]
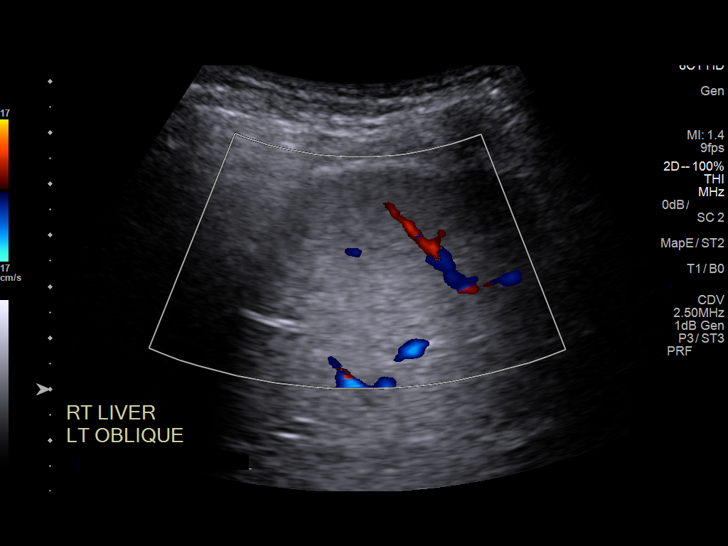
[im 2/13]
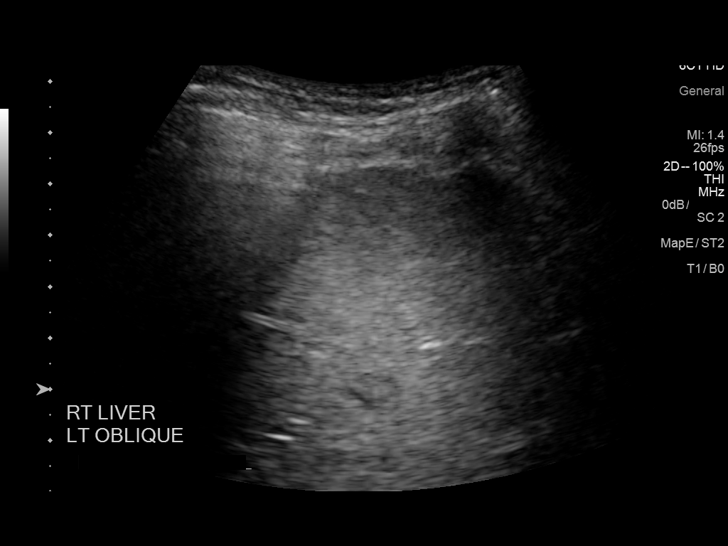
[im 3/13]
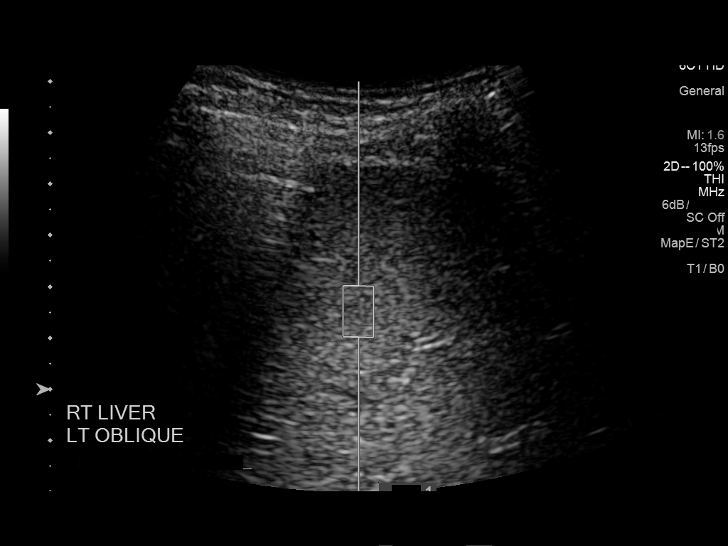
[im 4/13]
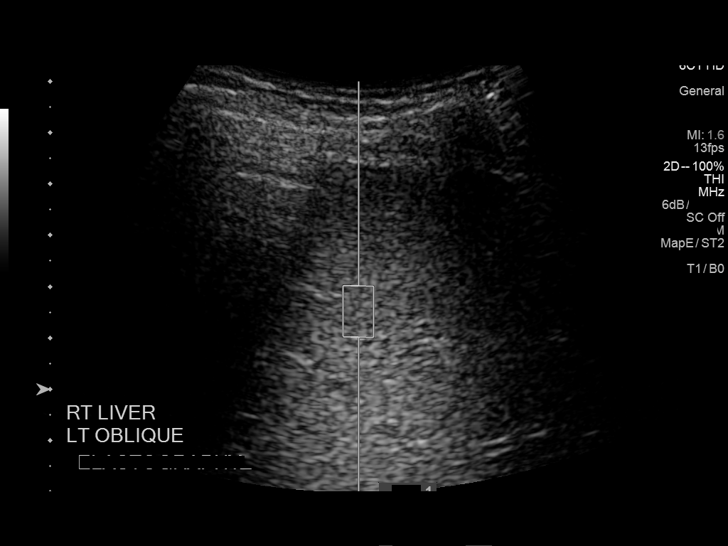
[im 5/13]
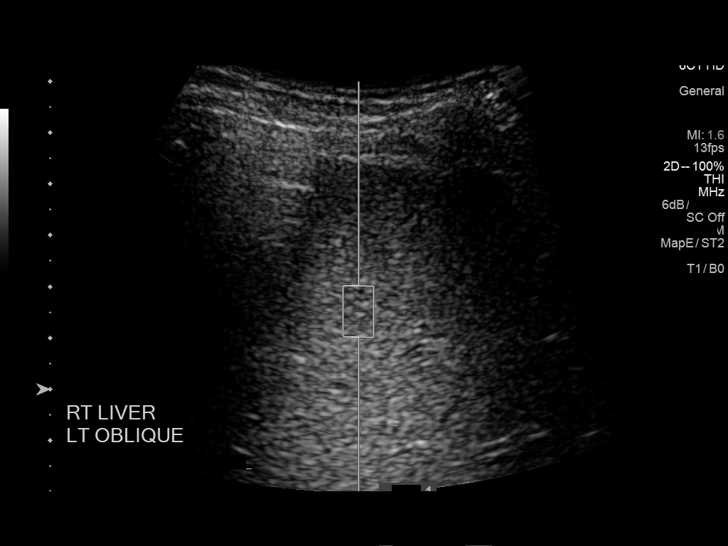
[im 6/13]
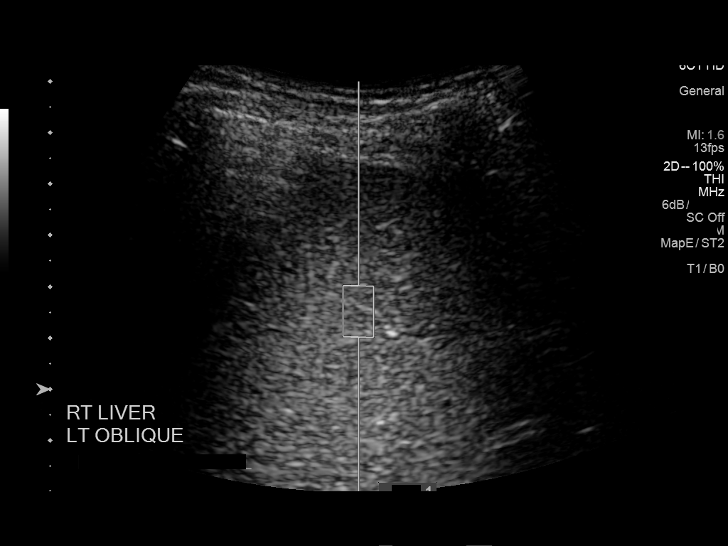
[im 7/13]
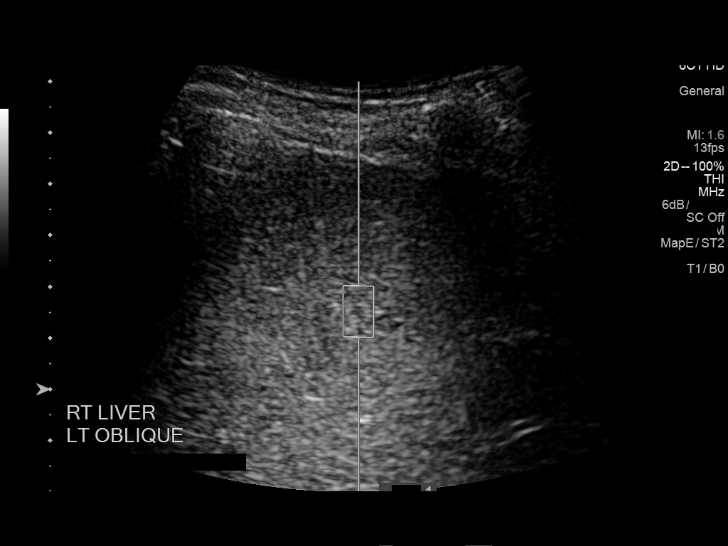
[im 8/13]
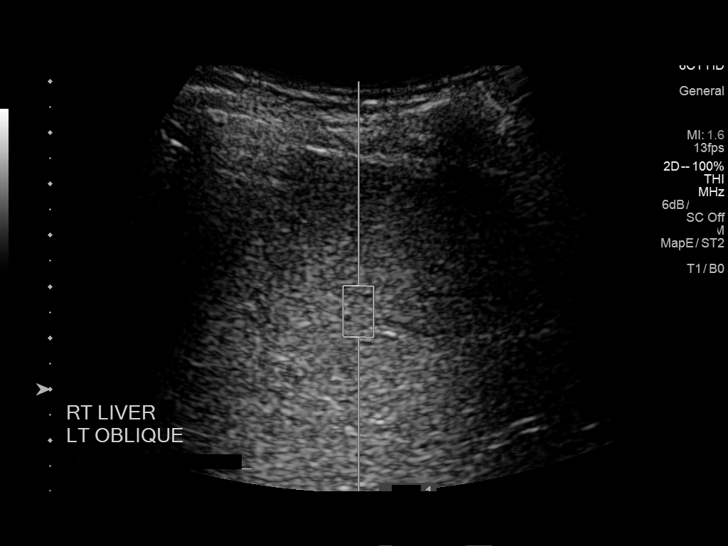
[im 9/13]
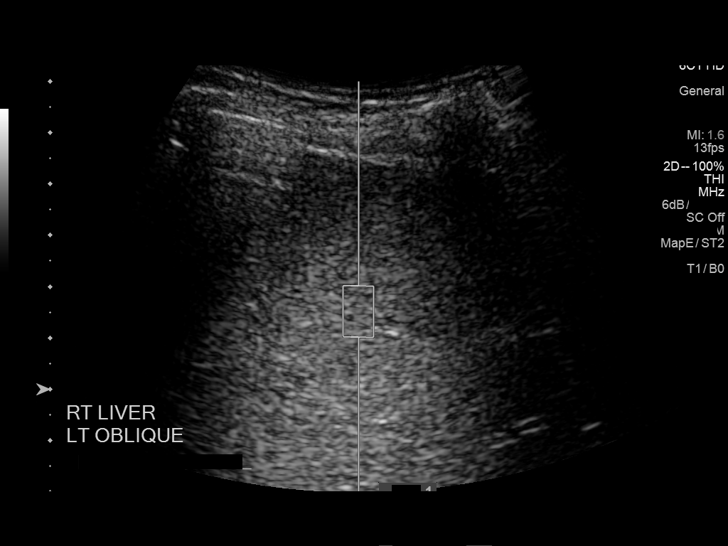
[im 10/13]
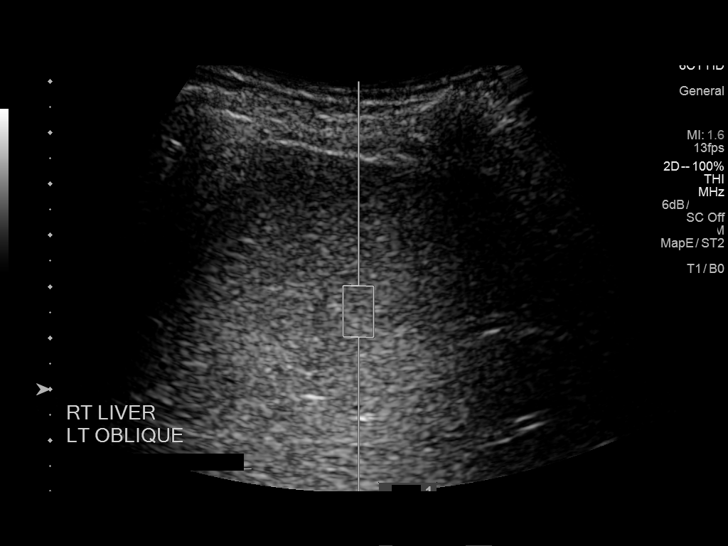
[im 11/13]
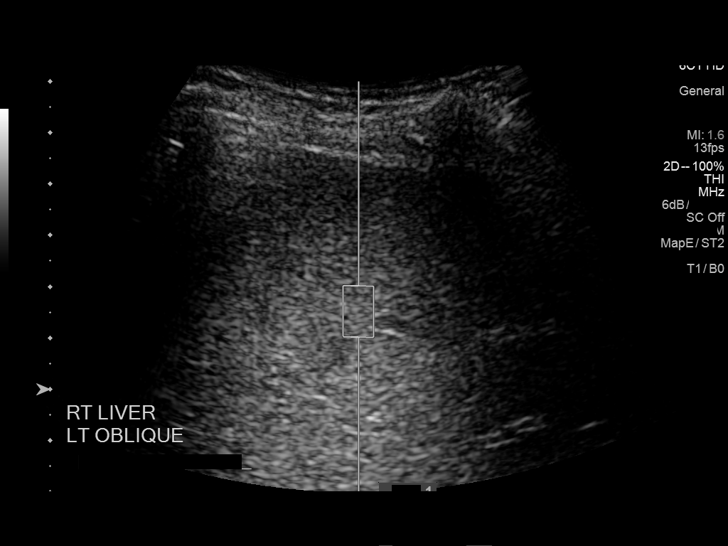
[im 12/13]
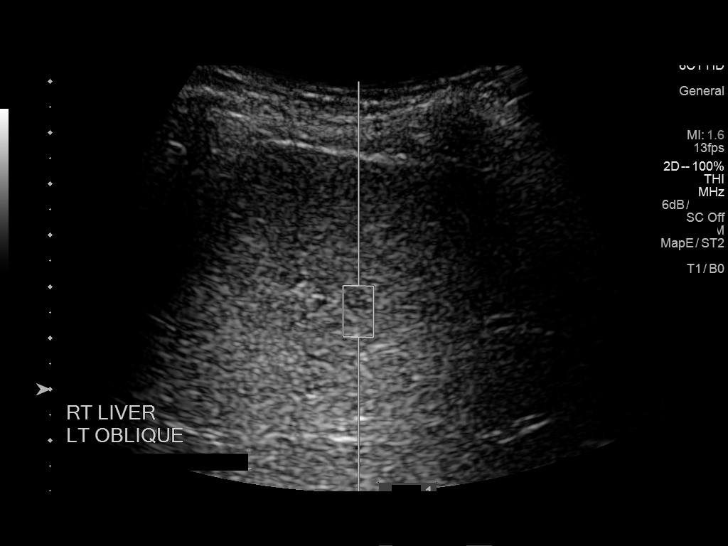
[im 13/13]
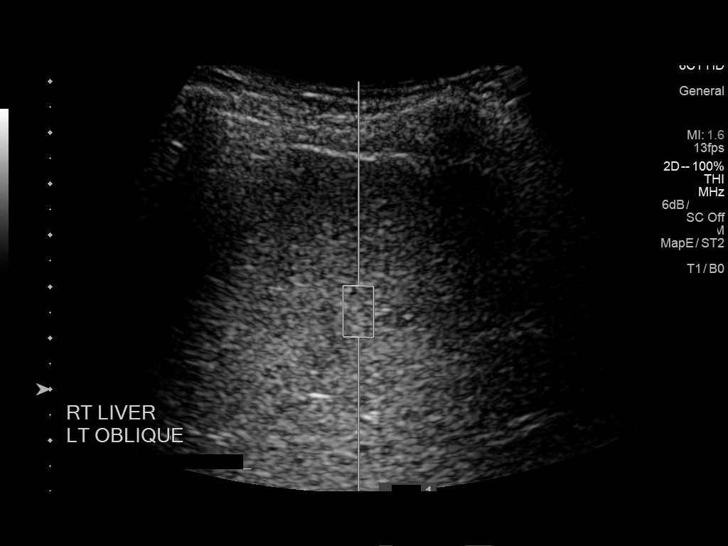

[13 of 13 positions shown; findings below may reference images not displayed]

FINDINGS: ULTRASOUND ABDOMEN

Gallbladder: No gallstones or wall thickening visualized. No
sonographic Murphy sign noted by sonographer.

Common bile duct: Diameter: 3 mm, within normal limits.

Liver: No focal lesion identified. Diffuse coarsening and
heterogeneous echotexture of the hepatic parenchyma is seen,
consistent with hepatocellular disease. Portal vein is patent on
color Doppler imaging with normal direction of blood flow towards
the liver.

IVC: No abnormality visualized.

Pancreas: Visualized portion unremarkable.

Spleen: Not visualized; no evidence of splenomegaly.

Right Kidney: Length: 10.4 cm. Echogenicity within normal limits.
1.4 cm cyst seen in upper pole. No solid mass or hydronephrosis
visualized.

Left Kidney: Length: 10.6 cm. Echogenicity within normal limits. No
mass or hydronephrosis visualized.

Abdominal aorta: No aneurysm visualized. Mild atherosclerotic plaque
noted.

Other findings: None.

ULTRASOUND HEPATIC ELASTOGRAPHY

Device: Siemens Helix VTQ

Patient position: Oblique

Transducer 6C1

Number of measurements: 10

Hepatic segment:  8

Median velocity:   1.75 m/sec

IQR:

IQR/Median velocity ratio:

Corresponding Metavir fibrosis score:  F2 + some F3

Risk of fibrosis: Moderate

Limitations of exam: None

Please note that abnormal shear wave velocities may also be
identified in clinical settings other than with hepatic fibrosis,
such as: acute hepatitis, elevated right heart and central venous
pressures including use of beta blockers, Aissatou disease
(Oxendine), infiltrative processes such as
mastocytosis/amyloidosis/infiltrative tumor, extrahepatic
cholestasis, in the post-prandial state, and liver transplantation.
Correlation with patient history, laboratory data, and clinical
condition recommended.
IMPRESSION: ULTRASOUND ABDOMEN:
Diffuse hepatocellular disease.  No liver mass identified.

No gallstones or other significant abnormality identified.

ULTRASOUND HEPATIC ELASTOGRAPHY:

Median hepatic shear wave velocity is calculated at 1.75 m/sec.

Corresponding Metavir fibrosis score is F2 + some F3.

Risk of fibrosis is Moderate.

Follow-up: Additional testing appropriate

## 2018-08-15 ENCOUNTER — Ambulatory Visit: Payer: Medicaid Other | Admitting: Family Medicine

## 2018-08-26 ENCOUNTER — Ambulatory Visit (INDEPENDENT_AMBULATORY_CARE_PROVIDER_SITE_OTHER): Payer: Medicaid Other | Admitting: Physician Assistant

## 2018-08-26 ENCOUNTER — Ambulatory Visit (INDEPENDENT_AMBULATORY_CARE_PROVIDER_SITE_OTHER): Payer: Medicaid Other

## 2018-08-26 ENCOUNTER — Encounter (INDEPENDENT_AMBULATORY_CARE_PROVIDER_SITE_OTHER): Payer: Self-pay | Admitting: Orthopaedic Surgery

## 2018-08-26 DIAGNOSIS — Z96651 Presence of right artificial knee joint: Secondary | ICD-10-CM

## 2018-08-26 NOTE — Progress Notes (Signed)
Post-Op Visit Note   Patient: Charles [name]           Date of Birth: 05-29-1961           MRN: [id number] Visit Date: 08/26/2018 PCP: Kallie LocksStroud, Natalie M, FNP   Assessment & Plan:  Chief Complaint:  Chief Complaint  Patient presents with  . Right Knee - Routine Post Op   Visit Diagnoses:  1. Status post right knee replacement     Plan: Patient is a pleasant 57 year old gentleman who presents to our clinic today 43 days status post right total knee replacement, date of surgery 07/14/2018.  [facility].  He has been working with physical therapy [facility], however he comes in today in a wheelchair.  He does feel his gait is still somewhat unsteady.  He denies any fevers or chills.  He does occasionally have lightening bolt pains to his knee with the extremes of flexion.  Examination of his right knee reveals a well-healed surgical incision without evidence of infection.  He does have a small Monocryl tail to the most proximal aspect of the incision.  No drainage.  2+ effusion.  Range of motion 0 to 100 degrees.  Calf is soft and nontender.  Today, I did pull the Monocryl tail.  I have counseled the patient on the need to ambulate with a walker or cane and to avoid a wheelchair if possible.  He will follow-up with us in 6 weeks time for recheck.  Follow-Up Instructions: Return in about 6 weeks (around 10/07/2018).   Orders:  Orders Placed This Encounter  Procedures  . XR KNEE 3 VIEW RIGHT   No orders of the defined types were placed in this encounter.   Imaging: Xr Knee 3 View Right  Result Date: 08/26/2018 X-rays demonstrate a well-seated prosthesis without evidence of subsidence   PMFS History: Patient Active Problem List   Diagnosis Date Noted  . Status post right knee replacement 07/14/2018  . Primary osteoarthritis of right knee   . Gout 05/26/2018  . Chronic pain of right knee 04/29/2018  . Surgery, elective  03/10/2018  . Status post hip replacement, left 03/10/2018  . Syphilis 01/01/2018  . Presbyopia 01/01/2018  . Chronic viral hepatitis C (HCC) 12/04/2017  . AVN (avascular necrosis of bone) (HCC) 12/04/2017  . Essential hypertension 07/20/2017  . CHF (congestive heart failure) (HCC) 07/20/2017  . Chronic obstructive pulmonary disease (HCC) 07/20/2017   Past Medical History:  Diagnosis Date  . Asthma   . 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 CHF (congestive heart failure) (HCC) 2018  . COPD (chronic obstructive pulmonary disease) (HCC)   . GERD (gastroesophageal reflux disease)   . Gout   . Gout   . Headache    headaches  . Hepatitis C virus carrier state (HCC)   . Hypertension   . Osteochondrosis   . Primary localized osteoarthritis of hip    Left    Family History  Problem Relation Age of Onset  . Hypertension Mother   . Hypertension Sister        twin  . Seizures Brother   . Schizophrenia Sister     Past Surgical History:  Procedure Laterality Date  . TOTAL HIP ARTHROPLASTY Left 03/10/2018   Procedure: LEFT TOTAL HIP ARTHROPLASTY ANTERIOR APPROACH;  Surgeon: Tarry KosXu, Naiping M, MD;  Location: [facility] OR;  Service: Orthopedics;  Laterality: Left;  .  TOTAL KNEE ARTHROPLASTY Right 07/14/2018   Procedure: RIGHT TOTAL KNEE ARTHROPLASTY;  Surgeon: Tarry Kos, MD;  Location: [facility] OR;  Service: Orthopedics;  Laterality: Right;   Social History   Occupational History  . Not on file  Tobacco Use  . Smoking status: Current Every Day Smoker    Packs/day: 0.20  . Smokeless tobacco: Never Used  . Tobacco comment: 3 -4 cigarettes a day  Substance and Sexual Activity  . Alcohol use: Not Currently    Alcohol/week: 7.0 standard drinks    Types: 7 Cans of beer per week    Comment: heavy drinker in the past  . Drug use: Yes    Types: Marijuana    Comment: 07/14/2018 - last dose 07/12/2018  . Sexual activity: Never

## 2018-08-29 ENCOUNTER — Telehealth: Payer: Self-pay

## 2018-08-29 NOTE — Telephone Encounter (Signed)
[facility] for Mavyret. Patient's medication costs $17,000. RN is requesting alternative medication.. Per last phone note patient is undetectable. Will route message to Dr. Ninetta LightsHatcher and Wilford Sportsassie, pharmacist for advise. [facility]: [phone]. Lorenso CourierJose L Maldonado, [place]

## 2018-09-01 NOTE — Telephone Encounter (Signed)
Will call living facility to inform them patient is no longer on medication.  Charles [name], [place]

## 2018-09-01 NOTE — Telephone Encounter (Signed)
He is no longer on that medication and does not need anything for hep C.

## 2018-09-05 ENCOUNTER — Other Ambulatory Visit: Payer: Self-pay | Admitting: Family Medicine

## 2018-09-05 DIAGNOSIS — I10 Essential (primary) hypertension: Secondary | ICD-10-CM

## 2018-09-05 NOTE — Telephone Encounter (Signed)
Thank you :)

## 2018-09-19 ENCOUNTER — Ambulatory Visit: Payer: Medicaid Other | Admitting: Family Medicine

## 2018-09-22 NOTE — H&P (Signed)
HISTORY AND PHYSICAL  Charles [name] is a 57 y.o. male patient referred by general dentist for removal tooth #31 and removal bilateral mandibular tori.  No diagnosis found.  Past Medical History:  Diagnosis Date  . Asthma   . 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 CHF (congestive heart failure) (HCC) 2018  . COPD (chronic obstructive pulmonary disease) (HCC)   . GERD (gastroesophageal reflux disease)   . Gout   . Gout   . Headache    headaches  . Hepatitis C virus carrier state (HCC)   . Hypertension   . Osteochondrosis   . Primary localized osteoarthritis of hip    Left    No current facility-administered medications for this encounter.    Current Outpatient Medications  Medication Sig Dispense Refill  . albuterol (PROVENTIL HFA;VENTOLIN HFA) 108 (90 Base) MCG/ACT inhaler Inhale 2 puffs into the lungs every 4 (four) hours as needed for wheezing or shortness of breath (cough, shortness of breath or wheezing.). 1 Inhaler 3  . allopurinol (ZYLOPRIM) 100 MG tablet Take 1 tablet (100 mg total) by mouth 2 (two) times daily. 60 tablet 3  . aspirin EC 81 MG tablet Take 1 tablet (81 mg total) by mouth 2 (two) times daily. 84 tablet 0  . budesonide-formoterol (SYMBICORT) 80-4.5 MCG/ACT inhaler Inhale 2 puffs into the lungs 2 (two) times daily. 1 Inhaler 11  . cetirizine (ZYRTEC) 10 MG tablet Take 1 tablet (10 mg total) by mouth daily. 30 tablet 11  . Colchicine (MITIGARE) 0.6 MG CAPS Take 0.6 mg by mouth daily as needed (gout flare). 30 capsule 3  . Glecaprevir-Pibrentasvir 100-40 MG TABS Take 3 tablets by mouth daily with supper.     . hydrochlorothiazide (HYDRODIURIL) 25 MG tablet Take 1 tablet (25 mg total) by mouth daily. 30 tablet 3  . ketoconazole (NIZORAL) 2 % cream Apply 2 application topically 2 (two) times daily. 15 g 3  . losartan (COZAAR) 50 MG tablet Take 1 tablet (50 mg total) by mouth daily. 30 tablet 3  . methocarbamol (ROBAXIN) 750 MG tablet Take 1 tablet  (750 mg total) by mouth 2 (two) times daily as needed for muscle spasms. 60 tablet 0  . omeprazole (PRILOSEC) 40 MG capsule Take 1 capsule (40 mg total) by mouth daily. 30 capsule 3  . ondansetron (ZOFRAN) 4 MG tablet Take 1-2 tablets (4-8 mg total) by mouth every 8 (eight) hours as needed for nausea or vomiting. 40 tablet 0  . oxyCODONE (OXY IR/ROXICODONE) 5 MG immediate release tablet Take 1-3 tablets (5-15 mg total) by mouth every 4 (four) hours as needed. (Patient taking differently: Take 5-15 mg by mouth every 4 (four) hours as needed for severe pain. ) 30 tablet 0  . promethazine (PHENERGAN) 25 MG tablet Take 1 tablet (25 mg total) by mouth every 6 (six) hours as needed for nausea. 30 tablet 1  . senna-docusate (SENOKOT S) 8.6-50 MG tablet Take 1 tablet by mouth at bedtime as needed. (Patient taking differently: Take 1 tablet by mouth at bedtime as needed for mild constipation. ) 30 tablet 1  . SUMAtriptan (IMITREX) 100 MG tablet Take 100 mg by mouth every 2 (two) hours as needed for migraine. May repeat in 2 hours if headache persists or recurs.    . tamsulosin (FLOMAX) 0.4 MG CAPS capsule Take 1 capsule (0.4 mg total) by mouth daily. 30 capsule 3  . traMADol (ULTRAM) 50 MG tablet 50 mg 3 (three)  times daily as needed for moderate pain.    . traZODone (DESYREL) 50 MG tablet Take 0.5-1 tablets (25-50 mg total) by mouth at bedtime as needed for sleep. 60 tablet 3   No Known Allergies Active Problems:   * No active hospital problems. *  Vitals: There were no vitals taken for this visit. Lab results:No results found for this or any previous visit (from the past 24 hour(s)). Radiology Results: No results found. General appearance: alert, cooperative and no distress Head: Normocephalic, without obvious abnormality, atraumatic Eyes: negative Nose: Nares normal. Septum midline. Mucosa normal. No drainage or sinus tenderness. Throat: large carious lesion tooth 31, bilateral mandibular lingual  tori. pharynx clear, no trismus Neck: no adenopathy, supple, symmetrical, trachea midline and thyroid not enlarged, symmetric, no tenderness/mass/nodules Resp: clear to auscultation bilaterally Cardio: regular rate and rhythm, S1, S2 normal, no murmur, click, rub or gallop  Assessment: Nonrestorable tooth #31. Bilateral mandibular lingual tori.  Plan: extraction  tooth #31. Removal Bilateral mandibular lingual tori. GA, Day surgery.    Ocie DoyneScott Jerrie Schussler 09/22/2018

## 2018-09-22 NOTE — Progress Notes (Signed)
Made several unsuccessful attempts to reach pt all all contact numbers listed. Left voice message with pre-op instructions, according to pre-op check list, on pt cell phone. Pt made aware to stop taking vitamins, fish oil and herbal medications. Please complete assessment on DOS.

## 2018-09-23 ENCOUNTER — Encounter (HOSPITAL_COMMUNITY)
Admission: RE | Disposition: A | Discharge status: Home / Self Care | Payer: Self-pay | Source: Home / Self Care | Attending: Oral Surgery

## 2018-09-23 ENCOUNTER — Ambulatory Visit (HOSPITAL_COMMUNITY)
Admission: RE | Admit: 2018-09-23 | Discharge: 2018-09-23 | Disposition: A | Discharge status: Home / Self Care | Payer: Medicaid Other | Attending: Oral Surgery | Admitting: Oral Surgery

## 2018-09-23 ENCOUNTER — Encounter (HOSPITAL_COMMUNITY): Payer: Self-pay

## 2018-09-23 ENCOUNTER — Ambulatory Visit (HOSPITAL_COMMUNITY): Payer: Medicaid Other | Admitting: Anesthesiology

## 2018-09-23 ENCOUNTER — Other Ambulatory Visit: Payer: Self-pay

## 2018-09-23 DIAGNOSIS — I110 Hypertensive heart disease with heart failure: Secondary | ICD-10-CM | POA: Diagnosis not present

## 2018-09-23 DIAGNOSIS — K219 Gastro-esophageal reflux disease without esophagitis: Secondary | ICD-10-CM | POA: Insufficient documentation

## 2018-09-23 DIAGNOSIS — M270 Developmental disorders of jaws: Secondary | ICD-10-CM | POA: Insufficient documentation

## 2018-09-23 DIAGNOSIS — Z79899 Other long term (current) drug therapy: Secondary | ICD-10-CM | POA: Diagnosis not present

## 2018-09-23 DIAGNOSIS — M109 Gout, unspecified: Secondary | ICD-10-CM | POA: Insufficient documentation

## 2018-09-23 DIAGNOSIS — F17200 Nicotine dependence, unspecified, uncomplicated: Secondary | ICD-10-CM | POA: Diagnosis not present

## 2018-09-23 DIAGNOSIS — Z96659 Presence of unspecified artificial knee joint: Secondary | ICD-10-CM | POA: Insufficient documentation

## 2018-09-23 DIAGNOSIS — Z7982 Long term (current) use of aspirin: Secondary | ICD-10-CM | POA: Diagnosis not present

## 2018-09-23 DIAGNOSIS — R51 Headache: Secondary | ICD-10-CM | POA: Diagnosis not present

## 2018-09-23 DIAGNOSIS — I509 Heart failure, unspecified: Secondary | ICD-10-CM | POA: Diagnosis not present

## 2018-09-23 DIAGNOSIS — Z7951 Long term (current) use of inhaled steroids: Secondary | ICD-10-CM | POA: Insufficient documentation

## 2018-09-23 DIAGNOSIS — B182 Chronic viral hepatitis C: Secondary | ICD-10-CM | POA: Insufficient documentation

## 2018-09-23 DIAGNOSIS — K089 Disorder of teeth and supporting structures, unspecified: Secondary | ICD-10-CM | POA: Insufficient documentation

## 2018-09-23 DIAGNOSIS — Z96649 Presence of unspecified artificial hip joint: Secondary | ICD-10-CM | POA: Diagnosis not present

## 2018-09-23 DIAGNOSIS — J449 Chronic obstructive pulmonary disease, unspecified: Secondary | ICD-10-CM | POA: Insufficient documentation

## 2018-09-23 HISTORY — PX: TOOTH EXTRACTION: SHX859

## 2018-09-23 LAB — CBC
HCT: 40.3 % (ref 39.0–52.0)
Hemoglobin: 12.2 g/dL — ABNORMAL LOW (ref 13.0–17.0)
MCH: 23.4 pg — ABNORMAL LOW (ref 26.0–34.0)
MCHC: 30.3 g/dL (ref 30.0–36.0)
MCV: 77.2 fL — ABNORMAL LOW (ref 80.0–100.0)
PLATELETS: 388 10*3/uL (ref 150–400)
RBC: 5.22 MIL/uL (ref 4.22–5.81)
RDW: 21.4 % — ABNORMAL HIGH (ref 11.5–15.5)
WBC: 8.3 10*3/uL (ref 4.0–10.5)
nRBC: 0 % (ref 0.0–0.2)

## 2018-09-23 LAB — COMPREHENSIVE METABOLIC PANEL
ALK PHOS: 81 U/L (ref 38–126)
ALT: 12 U/L (ref 0–44)
ANION GAP: 15 (ref 5–15)
AST: 22 U/L (ref 15–41)
Albumin: 3.7 g/dL (ref 3.5–5.0)
BUN: 7 mg/dL (ref 6–20)
CO2: 24 mmol/L (ref 22–32)
Calcium: 8.9 mg/dL (ref 8.9–10.3)
Chloride: 98 mmol/L (ref 98–111)
Creatinine, Ser: 0.92 mg/dL (ref 0.61–1.24)
GFR calc Af Amer: >60 mL/min (ref >60)
GFR calc non Af Amer: >60 mL/min (ref >60)
Glucose, Bld: 106 mg/dL — ABNORMAL HIGH (ref 70–99)
Potassium: 3.4 mmol/L — ABNORMAL LOW (ref 3.5–5.1)
SODIUM: 137 mmol/L (ref 135–145)
Total Bilirubin: 1 mg/dL (ref 0.3–1.2)
Total Protein: 8.2 g/dL — ABNORMAL HIGH (ref 6.5–8.1)

## 2018-09-23 SURGERY — DENTAL RESTORATION/EXTRACTIONS
Anesthesia: General | Site: Mouth

## 2018-09-23 MED ORDER — HYDROMORPHONE HCL 1 MG/ML IJ SOLN
0.2500 mg | INTRAMUSCULAR | Status: DC | PRN
  Administered 2018-09-23 (×2): 0.5 mg via INTRAVENOUS

## 2018-09-23 MED ORDER — LIDOCAINE-EPINEPHRINE 2 %-1:100000 IJ SOLN
INTRAMUSCULAR | Status: AC
  Filled 2018-09-23: qty 1

## 2018-09-23 MED ORDER — PROPOFOL 10 MG/ML IV BOLUS
INTRAVENOUS | Status: DC | PRN
  Administered 2018-09-23: 200 mg via INTRAVENOUS

## 2018-09-23 MED ORDER — DEXAMETHASONE SODIUM PHOSPHATE 10 MG/ML IJ SOLN
INTRAMUSCULAR | Status: AC
  Filled 2018-09-23: qty 1

## 2018-09-23 MED ORDER — HYDROMORPHONE HCL 1 MG/ML IJ SOLN
INTRAMUSCULAR | Status: AC
  Filled 2018-09-23: qty 1

## 2018-09-23 MED ORDER — MIDAZOLAM HCL 2 MG/2ML IJ SOLN
INTRAMUSCULAR | Status: DC | PRN
  Administered 2018-09-23: 2 mg via INTRAVENOUS

## 2018-09-23 MED ORDER — DEXAMETHASONE SODIUM PHOSPHATE 10 MG/ML IJ SOLN
INTRAMUSCULAR | Status: DC | PRN
  Administered 2018-09-23: 10 mg via INTRAVENOUS

## 2018-09-23 MED ORDER — PROPOFOL 10 MG/ML IV BOLUS
INTRAVENOUS | Status: AC
  Filled 2018-09-23: qty 20

## 2018-09-23 MED ORDER — OXYCODONE-ACETAMINOPHEN 5-325 MG PO TABS: 1.0000 | ORAL_TABLET | Freq: Four times a day (QID) | ORAL | 0 refills | Status: DC | PRN

## 2018-09-23 MED ORDER — MIDAZOLAM HCL 2 MG/2ML IJ SOLN
INTRAMUSCULAR | Status: AC
  Filled 2018-09-23: qty 2

## 2018-09-23 MED ORDER — LACTATED RINGERS IV SOLN
INTRAVENOUS | Status: DC
  Administered 2018-09-23: 09:00:00 via INTRAVENOUS

## 2018-09-23 MED ORDER — 0.9 % SODIUM CHLORIDE (POUR BTL) OPTIME
TOPICAL | Status: DC | PRN
  Administered 2018-09-23: 1000 mL

## 2018-09-23 MED ORDER — ROCURONIUM BROMIDE 50 MG/5ML IV SOSY
PREFILLED_SYRINGE | INTRAVENOUS | Status: AC
  Filled 2018-09-23: qty 5

## 2018-09-23 MED ORDER — OXYMETAZOLINE HCL 0.05 % NA SOLN
NASAL | Status: DC | PRN
  Administered 2018-09-23: 1

## 2018-09-23 MED ORDER — ROCURONIUM BROMIDE 50 MG/5ML IV SOSY
PREFILLED_SYRINGE | INTRAVENOUS | Status: DC | PRN
  Administered 2018-09-23: 40 mg via INTRAVENOUS

## 2018-09-23 MED ORDER — OXYMETAZOLINE HCL 0.05 % NA SOLN
NASAL | Status: AC
  Filled 2018-09-23: qty 15

## 2018-09-23 MED ORDER — LIDOCAINE-EPINEPHRINE 2 %-1:100000 IJ SOLN
INTRAMUSCULAR | Status: DC | PRN
  Administered 2018-09-23: 14 mL via INTRADERMAL

## 2018-09-23 MED ORDER — AMOXICILLIN 500 MG PO CAPS: 500.0000 mg | ORAL_CAPSULE | Freq: Three times a day (TID) | ORAL | 0 refills | Status: DC

## 2018-09-23 MED ORDER — SUGAMMADEX SODIUM 200 MG/2ML IV SOLN
INTRAVENOUS | Status: DC | PRN
  Administered 2018-09-23: 150 mg via INTRAVENOUS

## 2018-09-23 MED ORDER — LIDOCAINE 2% (20 MG/ML) 5 ML SYRINGE
INTRAMUSCULAR | Status: AC
  Filled 2018-09-23: qty 5

## 2018-09-23 MED ORDER — SODIUM CHLORIDE 0.9 % IR SOLN
Status: DC | PRN
  Administered 2018-09-23: 1000 mL

## 2018-09-23 MED ORDER — ONDANSETRON HCL 4 MG/2ML IJ SOLN
INTRAMUSCULAR | Status: AC
  Filled 2018-09-23: qty 2

## 2018-09-23 MED ORDER — FENTANYL CITRATE (PF) 250 MCG/5ML IJ SOLN
INTRAMUSCULAR | Status: AC
  Filled 2018-09-23: qty 5

## 2018-09-23 MED ORDER — LIDOCAINE 2% (20 MG/ML) 5 ML SYRINGE
INTRAMUSCULAR | Status: DC | PRN
  Administered 2018-09-23: 60 mg via INTRAVENOUS

## 2018-09-23 MED ORDER — ONDANSETRON HCL 4 MG/2ML IJ SOLN
INTRAMUSCULAR | Status: DC | PRN
  Administered 2018-09-23: 4 mg via INTRAVENOUS

## 2018-09-23 MED ORDER — FENTANYL CITRATE (PF) 100 MCG/2ML IJ SOLN
INTRAMUSCULAR | Status: DC | PRN
  Administered 2018-09-23: 100 ug via INTRAVENOUS

## 2018-09-23 MED ORDER — CEFAZOLIN SODIUM-DEXTROSE 2-4 GM/100ML-% IV SOLN
2.0000 g | INTRAVENOUS | Status: AC
  Administered 2018-09-23: 2 g via INTRAVENOUS

## 2018-09-23 SURGICAL SUPPLY — 38 items
BLADE SURG 15 STRL LF DISP TIS (BLADE) ×1 IMPLANT
BLADE SURG 15 STRL SS (BLADE) ×2
BUR CROSS CUT FISSURE 1.6 (BURR) ×2 IMPLANT
BUR CROSS CUT FISSURE 1.6MM (BURR) ×1
BUR EGG ELITE 4.0 (BURR) ×2 IMPLANT
BUR EGG ELITE 4.0MM (BURR) ×1
CANISTER SUCT 3000ML PPV (MISCELLANEOUS) ×3 IMPLANT
COVER SURGICAL LIGHT HANDLE (MISCELLANEOUS) ×3 IMPLANT
COVER WAND RF STERILE (DRAPES) ×3 IMPLANT
DECANTER SPIKE VIAL GLASS SM (MISCELLANEOUS) ×3 IMPLANT
DRAPE U-SHAPE 76X120 STRL (DRAPES) ×3 IMPLANT
GAUZE PACKING FOLDED 2  STR (GAUZE/BANDAGES/DRESSINGS) ×2
GAUZE PACKING FOLDED 2 STR (GAUZE/BANDAGES/DRESSINGS) ×1 IMPLANT
GLOVE BIO SURGEON STRL SZ 6.5 (GLOVE) IMPLANT
GLOVE BIO SURGEON STRL SZ7 (GLOVE) IMPLANT
GLOVE BIO SURGEON STRL SZ7.5 (GLOVE) ×3 IMPLANT
GLOVE BIO SURGEONS STRL SZ 6.5 (GLOVE)
GLOVE BIOGEL PI IND STRL 6.5 (GLOVE) IMPLANT
GLOVE BIOGEL PI IND STRL 7.0 (GLOVE) IMPLANT
GLOVE BIOGEL PI INDICATOR 6.5 (GLOVE)
GLOVE BIOGEL PI INDICATOR 7.0 (GLOVE)
GOWN STRL REUS W/ TWL LRG LVL3 (GOWN DISPOSABLE) ×1 IMPLANT
GOWN STRL REUS W/ TWL XL LVL3 (GOWN DISPOSABLE) ×1 IMPLANT
GOWN STRL REUS W/TWL LRG LVL3 (GOWN DISPOSABLE) ×2
GOWN STRL REUS W/TWL XL LVL3 (GOWN DISPOSABLE) ×2
IV NS 1000ML (IV SOLUTION) ×2
IV NS 1000ML BAXH (IV SOLUTION) ×1 IMPLANT
KIT BASIN OR (CUSTOM PROCEDURE TRAY) ×3 IMPLANT
KIT TURNOVER KIT B (KITS) ×3 IMPLANT
NEEDLE 22X1 1/2 (OR ONLY) (NEEDLE) ×6 IMPLANT
NS IRRIG 1000ML POUR BTL (IV SOLUTION) ×3 IMPLANT
PAD ARMBOARD 7.5X6 YLW CONV (MISCELLANEOUS) ×3 IMPLANT
SPONGE SURGIFOAM ABS GEL 12-7 (HEMOSTASIS) IMPLANT
SUT CHROMIC 3 0 PS 2 (SUTURE) ×3 IMPLANT
SYR CONTROL 10ML LL (SYRINGE) ×3 IMPLANT
TRAY ENT MC OR (CUSTOM PROCEDURE TRAY) ×3 IMPLANT
TUBING IRRIGATION (MISCELLANEOUS) ×3 IMPLANT
YANKAUER SUCT BULB TIP NO VENT (SUCTIONS) ×3 IMPLANT

## 2018-09-23 NOTE — Anesthesia Postprocedure Evaluation (Signed)
Anesthesia Post Note  Patient: Charles [name]  Procedure(s) Performed: REMOVAL BILATERAL MANDIBULAR LINGUAL TORI, EXTRACT TOOTH 31 (N/A Mouth)     Patient location during evaluation: PACU Anesthesia Type: General Level of consciousness: awake and alert Pain management: pain level controlled Vital Signs Assessment: post-procedure vital signs reviewed and stable Respiratory status: spontaneous breathing, nonlabored ventilation and respiratory function stable Cardiovascular status: blood pressure returned to baseline and stable Postop Assessment: no apparent nausea or vomiting Anesthetic complications: no    Last Vitals:  Vitals:   09/23/18 1400 09/23/18 1403  BP:  (!) 148/93  Pulse: 85 87  Resp: 14 11  Temp:    SpO2: 98% 97%    Last Pain:  Vitals:   09/23/18 1305  TempSrc:   PainSc: 0-No pain                 Anieya Helman,W. EDMOND

## 2018-09-23 NOTE — Anesthesia Preprocedure Evaluation (Addendum)
Anesthesia Evaluation  Patient identified by MRN, date of birth, ID band Patient awake    Reviewed: Allergy & Precautions, H&P , NPO status , Patient's Chart, lab work & pertinent test results  Airway Mallampati: III  TM Distance: >3 FB Neck ROM: Full    Dental no notable dental hx. (+) Partial Lower, Partial Upper, Dental Advisory Given   Pulmonary asthma , COPD,  COPD inhaler, Current Smoker,    Pulmonary exam normal breath sounds clear to auscultation       Cardiovascular hypertension, Pt. on medications +CHF   Rhythm:Regular Rate:Normal     Neuro/Psych  Headaches, negative psych ROS   GI/Hepatic GERD  Medicated and Controlled,(+) Hepatitis -  Endo/Other  negative endocrine ROS  Renal/GU negative Renal ROS  negative genitourinary   Musculoskeletal  (+) Arthritis , Osteoarthritis,    Abdominal   Peds  Hematology negative hematology ROS (+)   Anesthesia Other Findings   Reproductive/Obstetrics negative OB ROS                            Anesthesia Physical Anesthesia Plan  ASA: II  Anesthesia Plan: General   Post-op Pain Management:    Induction: Intravenous  PONV Risk Score and Plan: 2 and Ondansetron, Dexamethasone and Midazolam  Airway Management Planned: Video Laryngoscope Planned and Nasal ETT  Additional Equipment:   Intra-op Plan:   Post-operative Plan: Extubation in OR  Informed Consent: I have reviewed the patients History and Physical, chart, labs and discussed the procedure including the risks, benefits and alternatives for the proposed anesthesia with the patient or authorized representative who has indicated his/her understanding and acceptance.   Dental advisory given  Plan Discussed with: CRNA  Anesthesia Plan Comments:         Anesthesia Quick Evaluation

## 2018-09-23 NOTE — H&P (Signed)
H&P documentation  -History and Physical Reviewed  -Patient has been re-examined  -No change in the plan of care  Rehman Levinson  

## 2018-09-23 NOTE — Anesthesia Procedure Notes (Signed)
Procedure Name: Intubation Date/Time: 09/23/2018 12:29 PM Performed by: De Nurseennie, Majesty Stehlin E, CRNA Pre-anesthesia Checklist: Patient identified, Emergency Drugs available, Suction available and Patient being monitored Patient Re-evaluated:Patient Re-evaluated prior to induction Oxygen Delivery Method: Circle system utilized Preoxygenation: Pre-oxygenation with 100% oxygen Induction Type: IV induction Ventilation: Mask ventilation without difficulty Laryngoscope Size: Glidescope and 4 Grade View: Grade I Nasal Tubes: Left, Nasal prep performed and Nasal Rae Tube size: 7.5 mm Number of attempts: 1 Placement Confirmation: ETT inserted through vocal cords under direct vision,  positive ETCO2 and breath sounds checked- equal and bilateral Secured at: 25 cm Tube secured with: Tape Dental Injury: Teeth and Oropharynx as per pre-operative assessment  Comments: glidescope used for nasal intubation

## 2018-09-23 NOTE — Op Note (Signed)
NAMBarnett Abu: Osbourn, Zohaib MEDICAL RECORD [id number] ACCOUNT [id number] DATE OF BIRTH:10-20-1960 FACILITY: [facility] LOCATION: [facility] PHYSICIAN:Jeanny Rymer M. Ramiya Delahunty, DDS  OPERATIVE REPORT  DATE OF PROCEDURE:  09/23/2018  PREOPERATIVE DIAGNOSIS:  Nonrestorable tooth #31, bilateral mandibular lingual tori.  POSTOPERATIVE DIAGNOSIS:  Nonrestorable tooth #31, bilateral mandibular lingual tori.  PROCEDURE:  Removal of bilateral mandibular lingual tori, extraction tooth #31.  SURGEON:  Ocie DoyneScott Eladio Dentremont, DDS  ANESTHESIA:  General nasal intubation, Fitzgerald attending.  DESCRIPTION OF PROCEDURE:  The patient was taken to the operating room and placed on the table in supine position.  General anesthesia was administered intravenously and a nasal endotracheal tube was placed and secured.  The eyes were protected and the  patient was draped for surgery.  A timeout was performed.  The posterior pharynx was suctioned and a throat pack was placed, 2% lidocaine 1:100,000 epinephrine was infiltrated in an inferior alveolar block on the right and left side and buccal  infiltration around the anterior mandible.  A bite block was placed on the right side of the mouth and a sweetheart retractor was used to retract the tongue and then a #15 blade was used to make an incision in the gingival sulcus on the lingual aspects  of teeth numbers 19, 20, 21, 22 and 23.  The periosteum was reflected to expose the lingual torus.  Then, the egg-shaped bur and a Stryker handpiece under irrigation was used to reduce the torus.  Then, the area was smoothed with a bone file and closed  with 3-0 chromic after irrigating.  The bite block and sweetheart retractor were repositioned to the left side of the mouth and a #15 blade was used to make an incision on the right lingual aspect of the mandible in the gingival sulcus of the anterior  teeth and carried back to tooth #31.  The periosteum around tooth #31 was incised with a #15 blade and then  reflected with a periosteal elevator.  The elevator was then used to expose the lingual torus and then tooth #31 was elevated.  Forceps was used  to attempt removal, but the tooth fractured necessitating sectioning of the tooth and removal of each root individually.  The socket was curetted and then the torus was removed using the egg-shaped bur and bone file under irrigation.  Then, the areas  were irrigated and closed with 3-0 chromic.  The oral cavity was then irrigated and suctioned and a throat pack was removed.  The patient was left in care of anesthesia for extubation and transport to recovery room with plans for discharge home through  day surgery.  ESTIMATED BLOOD LOSS:  Minimal.  COMPLICATIONS:  None.  SPECIMENS:  None.  TN/NUANCE  D:09/23/2018 T:09/23/2018 JOB:[id number]

## 2018-09-23 NOTE — Op Note (Signed)
09/23/2018  12:52 PM  PATIENT:  Charles [name]  57 y.o. male  PRE-OPERATIVE DIAGNOSIS:  NON-RESTORABLE TOOTH #31, BILATERAL MANDIBULAR LINGUAL TORI  POST-OPERATIVE DIAGNOSIS:  SAME  PROCEDURE:  Procedure(s): REMOVAL BILATERAL MANDIBULAR LINGUAL TORI, EXTRACT TOOTH 31  SURGEON:  Surgeon(s): Ocie DoyneJensen, Haylie Mccutcheon, DDS  ANESTHESIA:   local and general  EBL:  minimal  DRAINS: none   SPECIMEN:  No Specimen  COUNTS:  YES  PLAN OF CARE: Discharge to home after PACU  PATIENT DISPOSITION:  PACU - hemodynamically stable.   PROCEDURE DETAILS: Dictation # [id number]   Charles [name], DMD 09/23/2018 12:52 PM

## 2018-09-23 NOTE — Transfer of Care (Signed)
Immediate Anesthesia Transfer of Care Note  Patient: Charles [name]  Procedure(s) Performed: REMOVAL BILATERAL MANDIBULAR LINGUAL TORI, EXTRACT TOOTH 31 (N/A Mouth)  Patient Location: PACU  Anesthesia Type:General  Level of Consciousness: awake, alert  and oriented  Airway & Oxygen Therapy: Patient Spontanous Breathing  Post-op Assessment: Report given to RN  Post vital signs: Reviewed and stable  Last Vitals:  Vitals Value Taken Time  BP 148/89 09/23/2018  1:03 PM  Temp 36.7 C 09/23/2018  1:03 PM  Pulse 102 09/23/2018  1:04 PM  Resp 17 09/23/2018  1:04 PM  SpO2 98 % 09/23/2018  1:04 PM  Vitals shown include unvalidated device data.  Last Pain:  Vitals:   09/23/18 0904  TempSrc:   PainSc: 0-No pain         Complications: No apparent anesthesia complications

## 2018-09-24 ENCOUNTER — Encounter (HOSPITAL_COMMUNITY): Payer: Self-pay | Admitting: Oral Surgery

## 2018-10-07 ENCOUNTER — Ambulatory Visit (INDEPENDENT_AMBULATORY_CARE_PROVIDER_SITE_OTHER): Payer: Medicaid Other

## 2018-10-07 ENCOUNTER — Telehealth (INDEPENDENT_AMBULATORY_CARE_PROVIDER_SITE_OTHER): Payer: Self-pay | Admitting: *Deleted

## 2018-10-07 ENCOUNTER — Other Ambulatory Visit: Payer: Self-pay | Admitting: Family Medicine

## 2018-10-07 ENCOUNTER — Ambulatory Visit (INDEPENDENT_AMBULATORY_CARE_PROVIDER_SITE_OTHER): Payer: Medicaid Other | Admitting: Physician Assistant

## 2018-10-07 DIAGNOSIS — G8929 Other chronic pain: Secondary | ICD-10-CM

## 2018-10-07 DIAGNOSIS — I10 Essential (primary) hypertension: Secondary | ICD-10-CM

## 2018-10-07 DIAGNOSIS — M25561 Pain in right knee: Secondary | ICD-10-CM

## 2018-10-07 NOTE — Telephone Encounter (Signed)
[facility] see if can get pt rescheduled for US DVT, she is going to call pt to see if can come in today or tomorrow and then give me a call back to advise me of appt.

## 2018-10-07 NOTE — Progress Notes (Signed)
Post-Op Visit Note   Patient: Charles [name]           Date of Birth: November 27, 1960           MRN: [id number] Visit Date: 10/07/2018 PCP: Kallie LocksStroud, Natalie M, FNP   Assessment & Plan:  Chief Complaint:  Chief Complaint  Patient presents with  . Right Knee - Follow-up   Visit Diagnoses:  1. Chronic pain of right knee     Plan: Patient is a pleasant 57 year old gentleman who presents our clinic today 85 days status post right total knee replacement, date of surgery 07/14/2018.  He has been doing okay, but notes moderate pain to the popliteal fossa over the past few weeks.  This is somewhat inhibiting his ambulation.  He does note a recent event where his knee buckled.  No fevers or chills.  No history of DVT.  He is a smoker.  Examination of his right knee shows a 1+ effusion.  Range of motion 0 to 120 degrees.  He is stable valgus varus stress.  Calf is soft nontender.  He does have moderate tenderness to the popliteal fossa.  No signs of infection.  He is neurovascularly intact distally.  At this point, we will refer the patient for a venous Doppler right lower extremity to rule out DVT.  He will continue to work on strengthening exercises in the meantime.  Follow-up with us in 3 months time for recheck.  Follow-Up Instructions: Return in about 3 months (around 01/06/2019).   Orders:  Orders Placed This Encounter  Procedures  . XR KNEE 3 VIEW RIGHT   No orders of the defined types were placed in this encounter.   Imaging: Xr Knee 3 View Right  Result Date: 10/07/2018 Stable alignment of the prosthesis without evidence of loosening   PMFS History: Patient Active Problem List   Diagnosis Date Noted  . Status post right knee replacement 07/14/2018  . Primary osteoarthritis of right knee   . Gout 05/26/2018  . Chronic pain of right knee 04/29/2018  . Surgery, elective 03/10/2018  . Status post hip replacement, left 03/10/2018  . Syphilis 01/01/2018  . Presbyopia 01/01/2018  .  Chronic viral hepatitis C (HCC) 12/04/2017  . AVN (avascular necrosis of bone) (HCC) 12/04/2017  . Essential hypertension 07/20/2017  . CHF (congestive heart failure) (HCC) 07/20/2017  . Chronic obstructive pulmonary disease (HCC) 07/20/2017   Past Medical History:  Diagnosis Date  . Asthma   . 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 CHF (congestive heart failure) (HCC) 2018  . COPD (chronic obstructive pulmonary disease) (HCC)   . GERD (gastroesophageal reflux disease)   . Gout   . Gout   . Headache    headaches  . Hepatitis C virus carrier state (HCC)   . Hypertension   . Osteochondrosis   . Primary localized osteoarthritis of hip    Left    Family History  Problem Relation Age of Onset  . Hypertension Mother   . Hypertension Sister        twin  . Seizures Brother   . Schizophrenia Sister     Past Surgical History:  Procedure Laterality Date  . TOOTH EXTRACTION N/A 09/23/2018   Procedure: REMOVAL BILATERAL MANDIBULAR LINGUAL TORI, EXTRACT TOOTH 31;  Surgeon: Ocie DoyneJensen, Scott, DDS;  Location: [facility] OR;  Service: Oral Surgery;  Laterality: N/A;  . TOTAL HIP ARTHROPLASTY Left 03/10/2018   Procedure: LEFT TOTAL HIP ARTHROPLASTY ANTERIOR APPROACH;  Surgeon: Tarry KosXu, Naiping M, MD;  [facility];  Service: Orthopedics;  Laterality: Left;  . TOTAL KNEE ARTHROPLASTY Right 07/14/2018   Procedure: RIGHT TOTAL KNEE ARTHROPLASTY;  Surgeon: Tarry KosXu, Naiping M, MD;  Location: [facility] OR;  Service: Orthopedics;  Laterality: Right;   Social History   Occupational History  . Not on file  Tobacco Use  . Smoking status: Current Every Day Smoker    Packs/day: 0.20  . Smokeless tobacco: Never Used  . Tobacco comment: 3 -4 cigarettes a day  Substance and Sexual Activity  . Alcohol use: Not Currently    Alcohol/week: 7.0 standard drinks    Types: 7 Cans of beer per week    Comment: heavy drinker in the past  . Drug use: Yes    Types: Marijuana    Comment: 07/14/2018 - last dose 07/12/2018   . Sexual activity: Never

## 2018-10-09 ENCOUNTER — Telehealth (INDEPENDENT_AMBULATORY_CARE_PROVIDER_SITE_OTHER): Payer: Self-pay | Admitting: *Deleted

## 2018-10-09 NOTE — Telephone Encounter (Signed)
Received fax from [facility] healthcare with authorization for pt have Duplex scan of right knee to r/o DVT. Approval # [id number], valid 10/07/18-11/06/18

## 2018-10-16 ENCOUNTER — Ambulatory Visit (HOSPITAL_COMMUNITY)
Admission: RE | Admit: 2018-10-16 | Discharge: 2018-10-16 | Disposition: A | Discharge status: Home / Self Care | Payer: Medicaid Other | Source: Ambulatory Visit | Attending: Family Medicine | Admitting: Family Medicine

## 2018-10-16 ENCOUNTER — Telehealth (INDEPENDENT_AMBULATORY_CARE_PROVIDER_SITE_OTHER): Payer: Self-pay

## 2018-10-16 DIAGNOSIS — G8929 Other chronic pain: Secondary | ICD-10-CM | POA: Insufficient documentation

## 2018-10-16 DIAGNOSIS — M25561 Pain in right knee: Secondary | ICD-10-CM | POA: Diagnosis not present

## 2018-10-16 NOTE — Telephone Encounter (Signed)
FYI

## 2018-10-16 NOTE — Telephone Encounter (Signed)
Charles [name] with [facility] called stating that patient's DVT for Right LE is Negative.

## 2018-10-16 NOTE — Telephone Encounter (Signed)
ok 

## 2018-11-17 ENCOUNTER — Other Ambulatory Visit: Payer: Self-pay | Admitting: Family Medicine

## 2019-01-05 ENCOUNTER — Telehealth (INDEPENDENT_AMBULATORY_CARE_PROVIDER_SITE_OTHER): Payer: Self-pay

## 2019-01-05 NOTE — Telephone Encounter (Signed)
Called patient no answer.  LMOMto return call. If patient calls back please ask questions below. Thank you.  Do you have now or have you had in the past 7 days a fever and/or chills?   Do you have now or have you had in the past 7 days a cough?   Do you have now or have you had in the last 7 days nausea, vomiting or abdominal pain?   Have you been exposed to anyone who has tested positive for COVID-19?   Have you or anyone who lives with you traveled within the last month?

## 2019-01-06 ENCOUNTER — Other Ambulatory Visit: Payer: Self-pay | Admitting: Family Medicine

## 2019-01-06 ENCOUNTER — Ambulatory Visit (INDEPENDENT_AMBULATORY_CARE_PROVIDER_SITE_OTHER): Payer: Medicaid Other | Admitting: Orthopaedic Surgery

## 2019-01-06 DIAGNOSIS — G4700 Insomnia, unspecified: Secondary | ICD-10-CM

## 2019-02-03 ENCOUNTER — Other Ambulatory Visit: Payer: Self-pay | Admitting: Family Medicine

## 2019-02-03 DIAGNOSIS — G4700 Insomnia, unspecified: Secondary | ICD-10-CM

## 2019-12-18 IMAGING — DX DG KNEE 1-2V PORT*R*
2 series · 2 of 2 positions shown · non-contrast
Comparison: Preoperative views of the right knee August 09, 2017

CLINICAL DATA: Status post right total knee joint prosthesis
placement.

EXAM:
PORTABLE RIGHT KNEE - 1-2 VIEW

[knee ap]
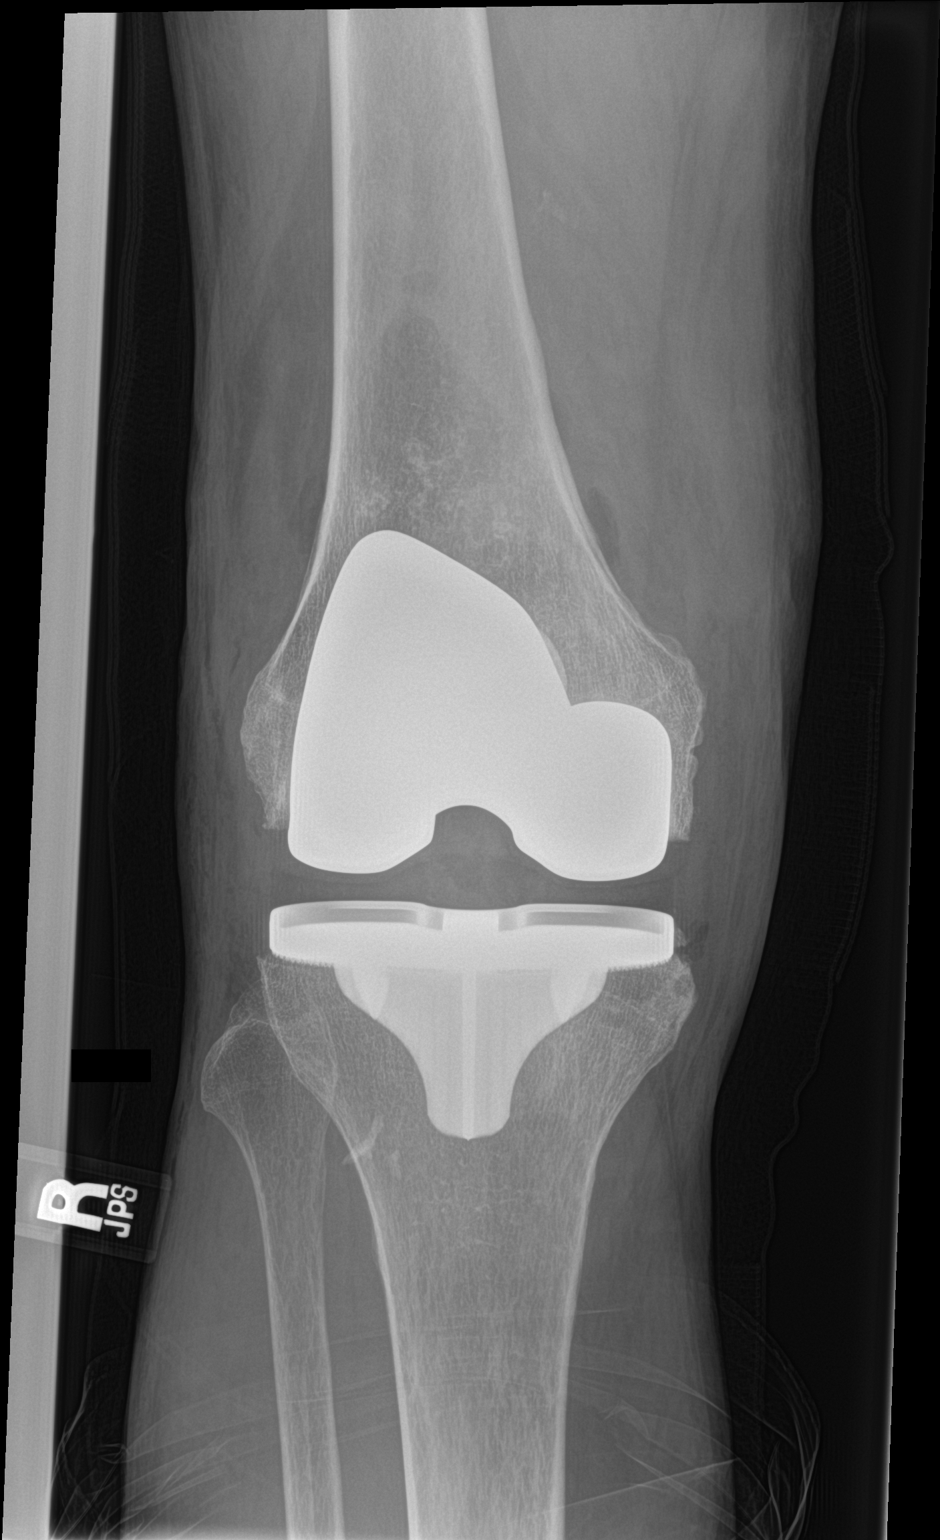

[knee lat]
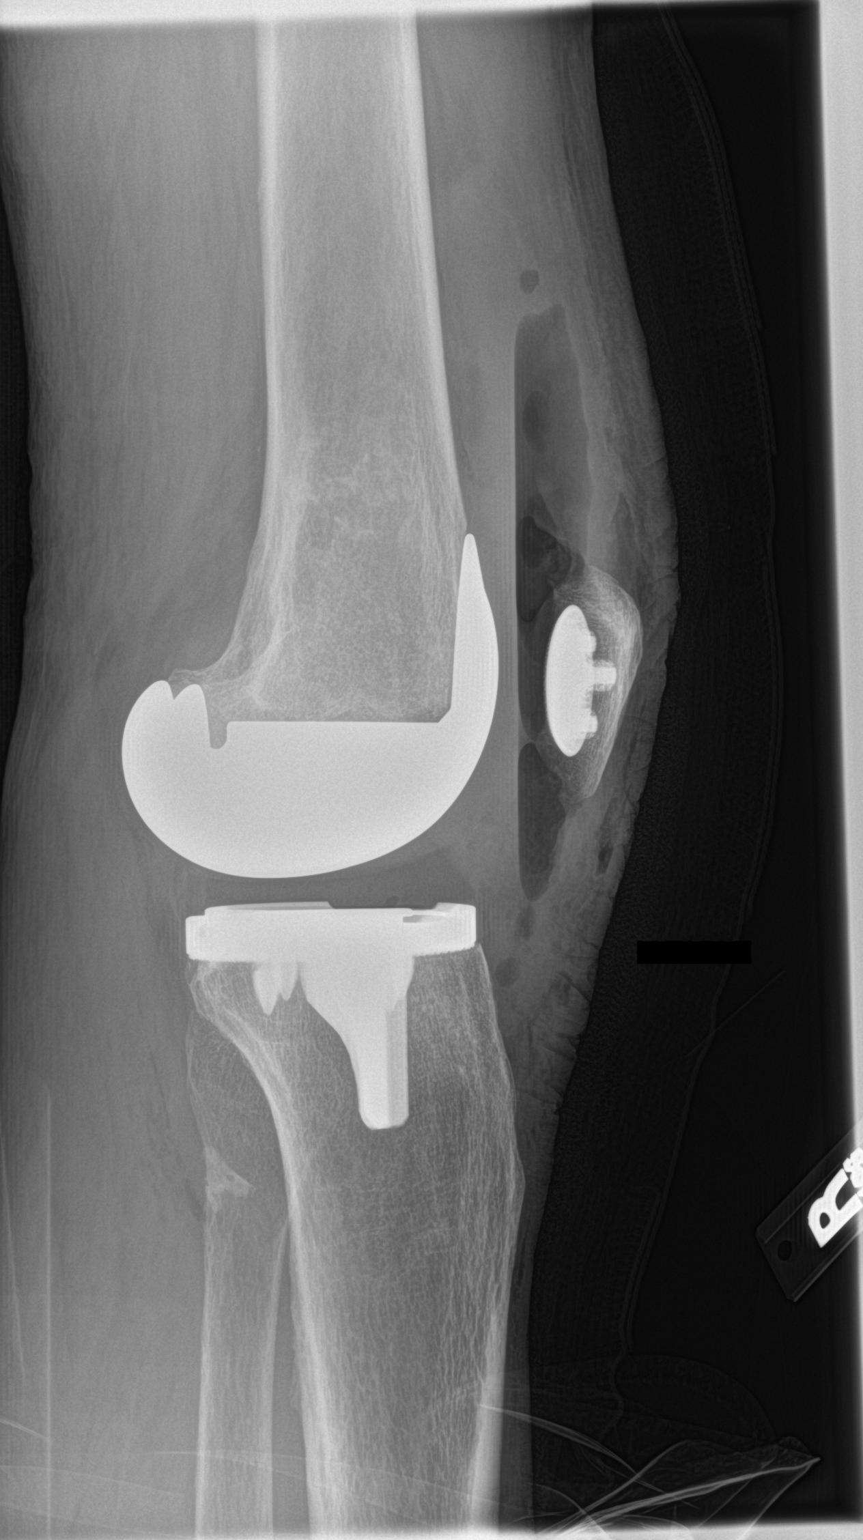

[2 of 2 positions shown; findings below may reference images not displayed]

FINDINGS: The patient has undergone placement of a prosthetic right knee
joint. Radiographic positioning of the prosthetic components is
good. The interface with the native bone is normal. There is an
air-fluid level in the anterior aspect of the soft tissues.
IMPRESSION: No immediate postprocedure complication following right total knee
joint prosthesis placement.

## 2020-01-14 ENCOUNTER — Ambulatory Visit: Payer: Self-pay

## 2020-01-14 ENCOUNTER — Other Ambulatory Visit: Payer: Self-pay

## 2020-01-14 ENCOUNTER — Encounter: Payer: Self-pay | Admitting: Orthopaedic Surgery

## 2020-01-14 ENCOUNTER — Ambulatory Visit (INDEPENDENT_AMBULATORY_CARE_PROVIDER_SITE_OTHER): Payer: Medicaid Other | Admitting: Orthopaedic Surgery

## 2020-01-14 DIAGNOSIS — M1611 Unilateral primary osteoarthritis, right hip: Secondary | ICD-10-CM

## 2020-01-14 DIAGNOSIS — M1712 Unilateral primary osteoarthritis, left knee: Secondary | ICD-10-CM | POA: Diagnosis not present

## 2020-01-14 DIAGNOSIS — Z96641 Presence of right artificial hip joint: Secondary | ICD-10-CM | POA: Diagnosis not present

## 2020-01-14 DIAGNOSIS — M5416 Radiculopathy, lumbar region: Secondary | ICD-10-CM

## 2020-01-14 DIAGNOSIS — Z96651 Presence of right artificial knee joint: Secondary | ICD-10-CM | POA: Diagnosis not present

## 2020-01-14 MED ORDER — METHYLPREDNISOLONE ACETATE 40 MG/ML IJ SUSP
40.0000 mg | INTRAMUSCULAR | Status: AC | PRN
  Administered 2020-01-14: 40 mg via INTRA_ARTICULAR

## 2020-01-14 MED ORDER — BUPIVACAINE HCL 0.25 % IJ SOLN
2.0000 mL | INTRAMUSCULAR | Status: AC | PRN
  Administered 2020-01-14: 2 mL via INTRA_ARTICULAR

## 2020-01-14 MED ORDER — LIDOCAINE HCL 1 % IJ SOLN
2.0000 mL | INTRAMUSCULAR | Status: AC | PRN
  Administered 2020-01-14: 2 mL

## 2020-01-14 NOTE — Progress Notes (Signed)
Office Visit Note   Patient: Charles [name]           Date of Birth: 1961/05/03           MRN: [id number] Visit Date: 01/14/2020              Requested by: Azzie Glatter, [place],  [place] [address] PCP: Azzie Glatter, FNP   Assessment & Plan: Visit Diagnoses:  1. Primary osteoarthritis of right hip   2. Hx of total knee replacement, right   3. Unilateral primary osteoarthritis, left knee   4. History of total hip replacement, right   5. Radiculopathy, lumbar region     Plan: Impression is #1 status post left total hip replacement, #2 right hip degenerative joint disease, #3 right lower extremity radiculopathy, #4 status post right total knee replacement #5 right knee degenerative joint disease.  In regards to the hips, I feel some of his pain is radicular nature and some is attributed to the underlying arthritis on the right.  We have discussed referral to Dr. Junius Roads for diagnostic and hopefully therapeutic cortisone injection.  Patient would like to hold off for now.  In regards to the knees, we proceeded with cortisone injection to the left knee today.  He will follow-up with [place] as needed.  Follow-Up Instructions: Return if symptoms worsen or fail to improve.   Orders:  Orders Placed This Encounter  Procedures  . Large Joint Inj: L knee  . XR HIP UNILAT W OR W/O PELVIS 2-3 VIEWS LEFT  . XR HIP UNILAT W OR W/O PELVIS 2-3 VIEWS RIGHT  . XR KNEE 3 VIEW RIGHT  . XR KNEE 3 VIEW LEFT  . XR Lumbar Spine 2-3 Views   No orders of the defined types were placed in this encounter.     Procedures: Large Joint Inj: L knee on 01/14/2020 11:10 AM Indications: pain Details: 22 G needle, anterolateral approach Medications: 2 mL lidocaine 1 %; 2 mL bupivacaine 0.25 %; 40 mg methylPREDNISolone acetate 40 MG/ML      Clinical Data: No additional findings.   Subjective: Chief Complaint  Patient presents with  . Left Knee - Pain  . Right Knee - Pain  . Left  Hip - Pain  . Right Hip - Pain    HPI patient is a pleasant 59 year old gentleman who comes in today with bilateral knee and bilateral hip pain.  He is status post left total hip 03/10/2018 and right total knee replacement 07/14/2018.  He has been doing well until recently.  In regards to his hips, the pain he has is to the groin.  He does note occasional pain that goes down the entire posterior aspect of the right leg.  He notes increased pain when sleeping on the left lateral hip.  In regards to his knees, on the right he complains more of popping with ambulation.  He also notes continuous numbness to the entire lower leg.  The left knee bothers him most when going from a sitting to standing position as well as when he is standing or lying down for too long a period of time.  No previous cortisone injection to the right hip or left knee joints.  No lumbar pain.  Review of Systems as detailed in HPI.  All others reviewed and are negative.   Objective: Vital Signs: There were no vitals taken for this visit.  Physical Exam well-developed well-nourished gentleman in no acute distress.  Alert  and oriented x3.  Ortho Exam examination of the right lower extremity reveals a positive straight leg raise.  Negative logroll negative FADIR.  Normal lumbar exam.  No focal weakness.  Left lower extremity has a positive logroll and positive FADIR.  Negative straight leg raise.  No focal weakness.  Right knee range of motion 0 to 115 degrees.  He is stable valgus varus stress.  He does have slight tenderness with varus stress.  Left knee exam shows minimal medial joint line tenderness.  Range of motion 0 to 120 degrees.  Ligaments are stable.  Specialty Comments:  No specialty comments available.  Imaging: XR HIP UNILAT W OR W/O PELVIS 2-3 VIEWS LEFT  Result Date: 01/14/2020 Well-seated prosthesis without complication  XR HIP UNILAT W OR W/O PELVIS 2-3 VIEWS RIGHT  Result Date: 01/14/2020 Mild to moderate  degenerative changes  XR KNEE 3 VIEW LEFT  Result Date: 01/14/2020 Moderate tricompartmental degenerative changes  XR KNEE 3 VIEW RIGHT  Result Date: 01/14/2020 Well-seated prosthesis without complication  XR Lumbar Spine 2-3 Views  Result Date: 01/14/2020 Mild diffuse spondylosis    PMFS History: Patient Active Problem List   Diagnosis Date Noted  . Status post right knee replacement 07/14/2018  . Primary osteoarthritis of right knee   . Gout 05/26/2018  . Chronic pain of right knee 04/29/2018  . Surgery, elective 03/10/2018  . Status post hip replacement, left 03/10/2018  . Syphilis 01/01/2018  . Presbyopia 01/01/2018  . Chronic viral hepatitis C (HCC) 12/04/2017  . AVN (avascular necrosis of bone) (HCC) 12/04/2017  . Essential hypertension 07/20/2017  . CHF (congestive heart failure) (HCC) 07/20/2017  . Chronic obstructive pulmonary disease (HCC) 07/20/2017   Past Medical History:  Diagnosis Date  . Asthma   . 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 CHF (congestive heart failure) (HCC) 2018  . COPD (chronic obstructive pulmonary disease) (HCC)   . GERD (gastroesophageal reflux disease)   . Gout   . Gout   . Headache    headaches  . Hepatitis C virus carrier state (HCC)   . Hypertension   . Osteochondrosis   . Primary localized osteoarthritis of hip    Left    Family History  Problem Relation Age of Onset  . Hypertension Mother   . Hypertension Sister        twin  . Seizures Brother   . Schizophrenia Sister     Past Surgical History:  Procedure Laterality Date  . TOOTH EXTRACTION N/A 09/23/2018   Procedure: REMOVAL BILATERAL MANDIBULAR LINGUAL TORI, EXTRACT TOOTH 31;  Surgeon: Ocie Doyne, DDS;  Location: [facility] OR;  Service: Oral Surgery;  Laterality: N/A;  . TOTAL HIP ARTHROPLASTY Left 03/10/2018   Procedure: LEFT TOTAL HIP ARTHROPLASTY ANTERIOR APPROACH;  Surgeon: Tarry Kos, MD;  Location: [facility] OR;  Service: Orthopedics;  Laterality:  Left;  . TOTAL KNEE ARTHROPLASTY Right 07/14/2018   Procedure: RIGHT TOTAL KNEE ARTHROPLASTY;  Surgeon: Tarry Kos, MD;  Location: [facility] OR;  Service: Orthopedics;  Laterality: Right;   Social History   Occupational History  . Not on file  Tobacco Use  . Smoking status: Current Every Day Smoker    Packs/day: 0.20  . Smokeless tobacco: Never Used  . Tobacco comment: 3 -4 cigarettes a day  Substance and Sexual Activity  . Alcohol use: Not Currently    Alcohol/week: 7.0 standard drinks    Types: 7 Cans of beer per week  Comment: heavy drinker in the past  . Drug use: Yes    Types: Marijuana    Comment: 07/14/2018 - last dose 07/12/2018  . Sexual activity: Never

## 2020-01-18 ENCOUNTER — Other Ambulatory Visit: Payer: Self-pay

## 2020-01-18 ENCOUNTER — Encounter: Payer: Self-pay | Admitting: Family Medicine

## 2020-01-18 ENCOUNTER — Ambulatory Visit (INDEPENDENT_AMBULATORY_CARE_PROVIDER_SITE_OTHER): Payer: Medicaid Other | Admitting: Family Medicine

## 2020-01-18 VITALS — BP 127/79 | HR 81 | Temp 97.8°F | Ht 71.0 in | Wt 119.0 lb

## 2020-01-18 DIAGNOSIS — J449 Chronic obstructive pulmonary disease, unspecified: Secondary | ICD-10-CM | POA: Diagnosis not present

## 2020-01-18 DIAGNOSIS — Z96642 Presence of left artificial hip joint: Secondary | ICD-10-CM

## 2020-01-18 DIAGNOSIS — Z7689 Persons encountering health services in other specified circumstances: Secondary | ICD-10-CM | POA: Diagnosis not present

## 2020-01-18 DIAGNOSIS — G4700 Insomnia, unspecified: Secondary | ICD-10-CM

## 2020-01-18 DIAGNOSIS — Z1211 Encounter for screening for malignant neoplasm of colon: Secondary | ICD-10-CM

## 2020-01-18 DIAGNOSIS — Z0000 Encounter for general adult medical examination without abnormal findings: Secondary | ICD-10-CM

## 2020-01-18 DIAGNOSIS — Z8619 Personal history of other infectious and parasitic diseases: Secondary | ICD-10-CM

## 2020-01-18 DIAGNOSIS — I10 Essential (primary) hypertension: Secondary | ICD-10-CM | POA: Diagnosis not present

## 2020-01-18 DIAGNOSIS — Z09 Encounter for follow-up examination after completed treatment for conditions other than malignant neoplasm: Secondary | ICD-10-CM | POA: Diagnosis not present

## 2020-01-18 DIAGNOSIS — Z96651 Presence of right artificial knee joint: Secondary | ICD-10-CM | POA: Diagnosis not present

## 2020-01-18 LAB — POCT URINALYSIS DIPSTICK
Bilirubin, UA: NEGATIVE
Blood, UA: NEGATIVE
Glucose, UA: NEGATIVE
Ketones, UA: NEGATIVE
Leukocytes, UA: NEGATIVE
Nitrite, UA: NEGATIVE
Protein, UA: NEGATIVE
Spec Grav, UA: 1.015 (ref 1.010–1.025)
Urobilinogen, UA: 0.2 E.U./dL
pH, UA: 5.5 (ref 5.0–8.0)

## 2020-01-18 LAB — GLUCOSE, POCT (MANUAL RESULT ENTRY): POC Glucose: 108 mg/dl — AB (ref 70–99)

## 2020-01-18 LAB — POCT GLYCOSYLATED HEMOGLOBIN (HGB A1C): Hemoglobin A1C: 4.6 % (ref 4.0–5.6)

## 2020-01-18 MED ORDER — LOSARTAN POTASSIUM 50 MG PO TABS: 50.0000 mg | ORAL_TABLET | Freq: Every day | ORAL | 3 refills | Status: DC

## 2020-01-18 MED ORDER — OMEPRAZOLE 40 MG PO CPDR: 40.0000 mg | DELAYED_RELEASE_CAPSULE | Freq: Every day | ORAL | 3 refills | Status: DC

## 2020-01-18 MED ORDER — ASPIRIN EC 81 MG PO TBEC: 81.0000 mg | DELAYED_RELEASE_TABLET | Freq: Two times a day (BID) | ORAL | 11 refills | Status: AC

## 2020-01-18 MED ORDER — HYDROCHLOROTHIAZIDE 25 MG PO TABS: 25.0000 mg | ORAL_TABLET | Freq: Every day | ORAL | 3 refills | Status: DC

## 2020-01-18 MED ORDER — TAMSULOSIN HCL 0.4 MG PO CAPS: 0.4000 mg | ORAL_CAPSULE | Freq: Every day | ORAL | 3 refills | Status: DC

## 2020-01-18 MED ORDER — ALBUTEROL SULFATE HFA 108 (90 BASE) MCG/ACT IN AERS: 2.0000 | INHALATION_SPRAY | RESPIRATORY_TRACT | 11 refills | Status: AC | PRN

## 2020-01-18 MED ORDER — METHOCARBAMOL 750 MG PO TABS: 750.0000 mg | ORAL_TABLET | Freq: Two times a day (BID) | ORAL | 0 refills | Status: DC | PRN

## 2020-01-18 MED ORDER — BUDESONIDE-FORMOTEROL FUMARATE 80-4.5 MCG/ACT IN AERO: 2.0000 | INHALATION_SPRAY | Freq: Two times a day (BID) | RESPIRATORY_TRACT | 11 refills | Status: AC

## 2020-01-18 MED ORDER — ALLOPURINOL 100 MG PO TABS: 100.0000 mg | ORAL_TABLET | Freq: Two times a day (BID) | ORAL | 3 refills | Status: AC

## 2020-01-18 MED ORDER — CETIRIZINE HCL 10 MG PO TABS: 10.0000 mg | ORAL_TABLET | Freq: Every day | ORAL | 11 refills | Status: AC

## 2020-01-18 NOTE — Progress Notes (Signed)
[facility]  Re-establish Care  Subjective:  Patient ID: Charles [name], male    DOB: 01-Jan-1961  Age: 59 y.o. MRN: [id number]  CC:  Chief Complaint  Patient presents with  . Follow-up    Re-est care  . Medication Refill    HPI Charles [name] is a 59 year old male who presents to Re-establish Care today.   Past Medical History:  Diagnosis Date  . Asthma   . 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 CHF (congestive heart failure) (HCC) 2018  . COPD (chronic obstructive pulmonary disease) (HCC)   . GERD (gastroesophageal reflux disease)   . Gout   . Gout   . Headache    headaches  . Hepatitis C virus carrier state (HCC)   . History of total left hip arthroplasty 03/10/2018  . History of total right knee replacement 2019  . Hypertension   . Insomnia   . Osteochondrosis   . Primary localized osteoarthritis of hip    Left   Current Status: Since his last office visit, he is doing well with no complaints. He has not followed up with Infection Disease. He has relocated several times since his last office visit. He recently had right total knee replacement in 10/20220. He states that he has cough and throat is dry at night. He denies visual changes, chest pain, shortness of breath, heart palpitations, and falls. He has occasional headaches and dizziness with position changes. Denies severe headaches, confusion, seizures, double vision, and blurred vision, nausea and vomiting. He denies fevers, chills, fatigue, recent infections, weight loss, and night sweats. No reports of GI problems such asdiarrhea, and constipation. He has no reports of blood in stools, dysuria and hematuria. No depression or anxiety, and denies suicidal ideations, homicidal ideations, or auditory hallucinations. He is taking all medications as prescribed. He continues to have moderated right knee and left hip pain today.   Past Surgical History:  Procedure  Laterality Date  . TOOTH EXTRACTION N/A 09/23/2018   Procedure: REMOVAL BILATERAL MANDIBULAR LINGUAL TORI, EXTRACT TOOTH 31;  Surgeon: Ocie Doyne, DDS;  Location: [facility];  Service: Oral Surgery;  Laterality: N/A;  . TOTAL HIP ARTHROPLASTY Left 03/10/2018   Procedure: LEFT TOTAL HIP ARTHROPLASTY ANTERIOR APPROACH;  Surgeon: Tarry Kos, MD;  Location: [facility];  Service: Orthopedics;  Laterality: Left;  . TOTAL KNEE ARTHROPLASTY Right 07/14/2018   Procedure: RIGHT TOTAL KNEE ARTHROPLASTY;  Surgeon: Tarry Kos, MD;  Location: [facility];  Service: Orthopedics;  Laterality: Right;    Family History  Problem Relation Age of Onset  . Hypertension Mother   . Hypertension Sister        twin  . Seizures Brother   . Schizophrenia Sister     Social History   Socioeconomic History  . Marital status: Single    Spouse name: Not on file  . Number of children: Not on file  . Years of education: Not on file  . Highest education level: Not on file  Occupational History  . Not on file  Tobacco Use  . Smoking status: Current Every Day Smoker    Packs/day: 0.20  . Smokeless tobacco: Never Used  . Tobacco comment: 3 -4 cigarettes a day  Substance and Sexual Activity  . Alcohol use: Yes    Alcohol/week: 7.0 standard drinks    Types: 7 Cans of beer per week    Comment: heavy drinker in  the past  . Drug use: Yes    Types: Marijuana    Comment: 07/14/2018 - last dose 07/12/2018  . Sexual activity: Not Currently  Other Topics Concern  . Not on file  Social History Narrative  . Not on file   Social Determinants of Health   Financial Resource Strain:   . Difficulty of Paying Living Expenses:   Food Insecurity:   . Worried About Programme researcher, broadcasting/film/videounning Out of Food in the Last Year:   . Baristaan Out of Food in the Last Year:   Transportation Needs:   . Freight forwarderLack of Transportation (Medical):   [name]. Lack of Transportation (Non-Medical):   Physical Activity:   . Days of Exercise per Week:   . Minutes of Exercise per Session:    Stress:   . Feeling of Stress :   Social Connections:   . Frequency of Communication with Friends and Family:   . Frequency of Social Gatherings with Friends and Family:   . Attends Religious Services:   . Active Member of Clubs or Organizations:   . Attends BankerClub or Organization Meetings:   [name]. Marital Status:   Intimate Partner Violence:   . Fear of Current or Ex-Partner:   . Emotionally Abused:   [name]. Physically Abused:   . Sexually Abused:     Outpatient Medications Prior to Visit  Medication Sig Dispense Refill  . Colchicine (MITIGARE) 0.6 MG CAPS Take 0.6 mg by mouth daily as needed (gout flare). (Patient not taking: Reported on 01/18/2020) 30 capsule 3  . Glecaprevir-Pibrentasvir 100-40 MG TABS Take 3 tablets by mouth daily with supper.     [name]. ketoconazole (NIZORAL) 2 % cream Apply 2 application topically 2 (two) times daily. (Patient not taking: Reported on 01/18/2020) 15 g 3  . traZODone (DESYREL) 50 MG tablet Take 0.5-1 tablets (25-50 mg total) by mouth at bedtime as needed for sleep. 60 tablet 3  . albuterol (PROVENTIL HFA;VENTOLIN HFA) 108 (90 Base) MCG/ACT inhaler Inhale 2 puffs into the lungs every 4 (four) hours as needed for wheezing or shortness of breath (cough, shortness of breath or wheezing.). (Patient not taking: Reported on 01/18/2020) 1 Inhaler 3  . allopurinol (ZYLOPRIM) 100 MG tablet Take 1 tablet (100 mg total) by mouth 2 (two) times daily. (Patient not taking: Reported on 01/18/2020) 60 tablet 3  . amoxicillin (AMOXIL) 500 MG capsule Take 1 capsule (500 mg total) by mouth 3 (three) times daily. (Patient not taking: Reported on 01/18/2020) 21 capsule 0  . aspirin EC 81 MG tablet Take 1 tablet (81 mg total) by mouth 2 (two) times daily. (Patient not taking: Reported on 01/18/2020) 84 tablet 0  . budesonide-formoterol (SYMBICORT) 80-4.5 MCG/ACT inhaler Inhale 2 puffs into the lungs 2 (two) times daily. (Patient not taking: Reported on 01/18/2020) 1 Inhaler 11  . cetirizine  (ZYRTEC) 10 MG tablet Take 1 tablet (10 mg total) by mouth daily. (Patient not taking: Reported on 01/18/2020) 30 tablet 11  . hydrochlorothiazide (HYDRODIURIL) 25 MG tablet Take 1 tablet (25 mg total) by mouth daily. (Patient not taking: Reported on 01/18/2020) 30 tablet 3  . losartan (COZAAR) 50 MG tablet Take 1 tablet (50 mg total) by mouth daily. (Patient not taking: Reported on 01/18/2020) 30 tablet 3  . methocarbamol (ROBAXIN) 750 MG tablet Take 1 tablet (750 mg total) by mouth 2 (two) times daily as needed for muscle spasms. (Patient not taking: Reported on 01/18/2020) 60 tablet 0  . omeprazole (PRILOSEC) 40 MG capsule Take 1 capsule (  40 mg total) by mouth daily. (Patient not taking: Reported on 01/18/2020) 30 capsule 3  . ondansetron (ZOFRAN) 4 MG tablet Take 1-2 tablets (4-8 mg total) by mouth every 8 (eight) hours as needed for nausea or vomiting. 40 tablet 0  . oxyCODONE (OXY IR/ROXICODONE) 5 MG immediate release tablet Take 1-3 tablets (5-15 mg total) by mouth every 4 (four) hours as needed. (Patient taking differently: Take 5-15 mg by mouth every 4 (four) hours as needed for severe pain. ) 30 tablet 0  . oxyCODONE-acetaminophen (PERCOCET) 5-325 MG tablet Take 1 tablet by mouth every 6 (six) hours as needed for severe pain. 30 tablet 0  . promethazine (PHENERGAN) 25 MG tablet Take 1 tablet (25 mg total) by mouth every 6 (six) hours as needed for nausea. 30 tablet 1  . senna-docusate (SENOKOT S) 8.6-50 MG tablet Take 1 tablet by mouth at bedtime as needed. (Patient taking differently: Take 1 tablet by mouth at bedtime as needed for mild constipation. ) 30 tablet 1  . SUMAtriptan (IMITREX) 100 MG tablet Take 100 mg by mouth every 2 (two) hours as needed for migraine. May repeat in 2 hours if headache persists or recurs.    . tamsulosin (FLOMAX) 0.4 MG CAPS capsule Take 1 capsule (0.4 mg total) by mouth daily. (Patient not taking: Reported on 01/18/2020) 30 capsule 3   No facility-administered  medications prior to visit.    No Known Allergies  ROS Review of Systems  Constitutional: Negative.   HENT: Negative.   Eyes: Negative.   Respiratory: Negative.   Cardiovascular: Negative.   Gastrointestinal: Negative.   Endocrine: Negative.   Genitourinary: Negative.   Musculoskeletal: Positive for arthralgias (generalized joint pain).       Right knee and left hip pain  Skin: Negative.   Allergic/Immunologic: Negative.   Neurological: Positive for dizziness (occasional ) and headaches (occasional).  Hematological: Negative.   Psychiatric/Behavioral: Negative.       Objective:    Physical Exam  Constitutional: He is oriented to person, place, and time. He appears well-developed and well-nourished.  HENT:  Head: Normocephalic and atraumatic.  Eyes: Conjunctivae are normal.  Cardiovascular: Normal rate, regular rhythm, normal heart sounds and intact distal pulses.  Pulmonary/Chest: Effort normal and breath sounds normal.  Abdominal: Soft. Bowel sounds are normal.  Musculoskeletal:        General: Normal range of motion.     Cervical back: Normal range of motion and neck supple.  Neurological: He is alert and oriented to person, place, and time. He has normal reflexes.  Skin: Skin is warm and dry.  Psychiatric: He has a normal mood and affect. His behavior is normal. Judgment and thought content normal.  Nursing note and vitals reviewed.   BP 127/79   Pulse 81   Temp 97.8 F (36.6 C) (Oral)   Ht 5\' 11"  (1.803 m)   Wt 119 lb (54 kg)   SpO2 100%   BMI 16.60 kg/m  Wt Readings from Last 3 Encounters:  01/18/20 119 lb (54 kg)  09/23/18 162 lb (73.5 kg)  07/14/18 157 lb (71.2 kg)     Health Maintenance Due  Topic Date Due  . COLONOSCOPY  Never done    There are no preventive care reminders to display for this patient.  Lab Results  Component Value Date   TSH 0.691 01/18/2020   Lab Results  Component Value Date   WBC 3.6 01/18/2020   HGB 12.7 (L)  01/18/2020   HCT  35.9 (L) 01/18/2020   MCV 104 (H) 01/18/2020   PLT 194 01/18/2020   Lab Results  Component Value Date   NA 140 01/18/2020   K 4.2 01/18/2020   CO2 26 01/18/2020   GLUCOSE 88 01/18/2020   BUN 8 01/18/2020   CREATININE 0.69 (L) 01/18/2020   BILITOT 0.8 01/18/2020   ALKPHOS 97 01/18/2020   AST 69 (H) 01/18/2020   ALT 45 (H) 01/18/2020   PROT 7.8 01/18/2020   ALBUMIN 4.4 01/18/2020   CALCIUM 9.4 01/18/2020   ANIONGAP 15 09/23/2018   Lab Results  Component Value Date   CHOL 177 01/18/2020   Lab Results  Component Value Date   HDL 105 01/18/2020   Lab Results  Component Value Date   LDLCALC 49 01/18/2020   Lab Results  Component Value Date   TRIG 139 01/18/2020   Lab Results  Component Value Date   CHOLHDL 1.7 01/18/2020   Lab Results  Component Value Date   HGBA1C 4.6 01/18/2020      Assessment & Plan:   1. Encounter to establish care  2. Essential hypertension The current medical regimen is effective; blood pressure is stable at 127/79 today; continue present plan and medications as prescribed. He will continue to take medications as prescribed, to decrease high sodium intake, excessive alcohol intake, increase potassium intake, smoking cessation, and increase physical activity of at least 30 minutes of cardio activity daily. He will continue to follow Heart Healthy or DASH diet. - albuterol (VENTOLIN HFA) 108 (90 Base) MCG/ACT inhaler; Inhale 2 puffs into the lungs every 4 (four) hours as needed for wheezing or shortness of breath (cough, shortness of breath or wheezing.).  Dispense: 18 g; Refill: 11 - allopurinol (ZYLOPRIM) 100 MG tablet; Take 1 tablet (100 mg total) by mouth 2 (two) times daily.  Dispense: 60 tablet; Refill: 3 - aspirin EC 81 MG tablet; Take 1 tablet (81 mg total) by mouth 2 (two) times daily.  Dispense: 84 tablet; Refill: 11 - budesonide-formoterol (SYMBICORT) 80-4.5 MCG/ACT inhaler; Inhale 2 puffs into the lungs 2 (two) times  daily.  Dispense: 1 Inhaler; Refill: 11 - cetirizine (ZYRTEC) 10 MG tablet; Take 1 tablet (10 mg total) by mouth daily.  Dispense: 30 tablet; Refill: 11 - hydrochlorothiazide (HYDRODIURIL) 25 MG tablet; Take 1 tablet (25 mg total) by mouth daily.  Dispense: 30 tablet; Refill: 3 - losartan (COZAAR) 50 MG tablet; Take 1 tablet (50 mg total) by mouth daily.  Dispense: 30 tablet; Refill: 3 - methocarbamol (ROBAXIN) 750 MG tablet; Take 1 tablet (750 mg total) by mouth 2 (two) times daily as needed for muscle spasms.  Dispense: 60 tablet; Refill: 0 - omeprazole (PRILOSEC) 40 MG capsule; Take 1 capsule (40 mg total) by mouth daily.  Dispense: 30 capsule; Refill: 3 - tamsulosin (FLOMAX) 0.4 MG CAPS capsule; Take 1 capsule (0.4 mg total) by mouth daily.  Dispense: 30 capsule; Refill: 3  3. Insomnia, unspecified type  4. History of total right knee replacement  5. History of total left hip arthroplasty  6. Chronic obstructive pulmonary disease, unspecified COPD type (HCC) - albuterol (VENTOLIN HFA) 108 (90 Base) MCG/ACT inhaler; Inhale 2 puffs into the lungs every 4 (four) hours as needed for wheezing or shortness of breath (cough, shortness of breath or wheezing.).  Dispense: 18 g; Refill: 11  7. Colon cancer screening - Ambulatory referral to Gastroenterology  8. Healthcare maintenance - POCT glucose (manual entry) - POCT urinalysis dipstick - POCT glycosylated hemoglobin (Hb A1C) -  CBC with Differential - Comprehensive metabolic panel - Lipid Panel - TSH - PSA - Vitamin B12 - Vitamin D, 25-hydroxy  9. Follow up He will follow up in 3 months.   Meds ordered this encounter  Medications  . albuterol (VENTOLIN HFA) 108 (90 Base) MCG/ACT inhaler    Sig: Inhale 2 puffs into the lungs every 4 (four) hours as needed for wheezing or shortness of breath (cough, shortness of breath or wheezing.).    Dispense:  18 g    Refill:  11  . allopurinol (ZYLOPRIM) 100 MG tablet    Sig: Take 1 tablet  (100 mg total) by mouth 2 (two) times daily.    Dispense:  60 tablet    Refill:  3  . aspirin EC 81 MG tablet    Sig: Take 1 tablet (81 mg total) by mouth 2 (two) times daily.    Dispense:  84 tablet    Refill:  11  . budesonide-formoterol (SYMBICORT) 80-4.5 MCG/ACT inhaler    Sig: Inhale 2 puffs into the lungs 2 (two) times daily.    Dispense:  1 Inhaler    Refill:  11  . cetirizine (ZYRTEC) 10 MG tablet    Sig: Take 1 tablet (10 mg total) by mouth daily.    Dispense:  30 tablet    Refill:  11  . hydrochlorothiazide (HYDRODIURIL) 25 MG tablet    Sig: Take 1 tablet (25 mg total) by mouth daily.    Dispense:  30 tablet    Refill:  3  . losartan (COZAAR) 50 MG tablet    Sig: Take 1 tablet (50 mg total) by mouth daily.    Dispense:  30 tablet    Refill:  3  . methocarbamol (ROBAXIN) 750 MG tablet    Sig: Take 1 tablet (750 mg total) by mouth 2 (two) times daily as needed for muscle spasms.    Dispense:  60 tablet    Refill:  0  . omeprazole (PRILOSEC) 40 MG capsule    Sig: Take 1 capsule (40 mg total) by mouth daily.    Dispense:  30 capsule    Refill:  3  . tamsulosin (FLOMAX) 0.4 MG CAPS capsule    Sig: Take 1 capsule (0.4 mg total) by mouth daily.    Dispense:  30 capsule    Refill:  3    Orders Placed This Encounter  Procedures  . CBC with Differential  . Comprehensive metabolic panel  . Lipid Panel  . TSH  . PSA  . Vitamin B12  . Vitamin D, 25-hydroxy  . Hepatitis c antibody (reflex)  . Specimen status report  . Ambulatory referral to Gastroenterology  . Ambulatory referral to Infectious Disease  . POCT glucose (manual entry)  . POCT urinalysis dipstick  . POCT glycosylated hemoglobin (Hb A1C)     Referral Orders     Ambulatory referral to Gastroenterology     Ambulatory referral to Infectious Disease   Kathe Becton,  MSN, FNP-BC [facility] [place], [place]  [address] [phone] fax  Problem List Items Addressed This Visit      Cardiovascular and Mediastinum   Essential hypertension   Relevant Medications   albuterol (VENTOLIN HFA) 108 (90 Base) MCG/ACT inhaler   allopurinol (ZYLOPRIM) 100 MG tablet   aspirin EC 81 MG tablet   budesonide-formoterol (SYMBICORT) 80-4.5 MCG/ACT inhaler   cetirizine (ZYRTEC) 10 MG tablet  hydrochlorothiazide (HYDRODIURIL) 25 MG tablet   losartan (COZAAR) 50 MG tablet   methocarbamol (ROBAXIN) 750 MG tablet   omeprazole (PRILOSEC) 40 MG capsule   tamsulosin (FLOMAX) 0.4 MG CAPS capsule     Respiratory   Chronic obstructive pulmonary disease (HCC)   Relevant Medications   albuterol (VENTOLIN HFA) 108 (90 Base) MCG/ACT inhaler   budesonide-formoterol (SYMBICORT) 80-4.5 MCG/ACT inhaler   cetirizine (ZYRTEC) 10 MG tablet     Other   History of total right knee replacement    Other Visit Diagnoses    Healthcare maintenance    -  Primary   Relevant Orders   POCT glucose (manual entry) (Completed)   POCT urinalysis dipstick (Completed)   POCT glycosylated hemoglobin (Hb A1C) (Completed)   CBC with Differential (Completed)   Comprehensive metabolic panel (Completed)   Lipid Panel (Completed)   TSH (Completed)   PSA (Completed)   Vitamin B12 (Completed)   Vitamin D, 25-hydroxy (Completed)   Encounter to establish care       Insomnia, unspecified type       History of total left hip arthroplasty       Colon cancer screening       Relevant Orders   Ambulatory referral to Gastroenterology   Follow up       History of hepatitis C       Relevant Orders   Ambulatory referral to Infectious Disease   Hepatitis c antibody (reflex) (Completed)      Meds ordered this encounter  Medications  . albuterol (VENTOLIN HFA) 108 (90 Base) MCG/ACT inhaler    Sig: Inhale 2 puffs into the lungs every 4 (four) hours as needed for wheezing or shortness of breath (cough, shortness of breath or  wheezing.).    Dispense:  18 g    Refill:  11  . allopurinol (ZYLOPRIM) 100 MG tablet    Sig: Take 1 tablet (100 mg total) by mouth 2 (two) times daily.    Dispense:  60 tablet    Refill:  3  . aspirin EC 81 MG tablet    Sig: Take 1 tablet (81 mg total) by mouth 2 (two) times daily.    Dispense:  84 tablet    Refill:  11  . budesonide-formoterol (SYMBICORT) 80-4.5 MCG/ACT inhaler    Sig: Inhale 2 puffs into the lungs 2 (two) times daily.    Dispense:  1 Inhaler    Refill:  11  . cetirizine (ZYRTEC) 10 MG tablet    Sig: Take 1 tablet (10 mg total) by mouth daily.    Dispense:  30 tablet    Refill:  11  . hydrochlorothiazide (HYDRODIURIL) 25 MG tablet    Sig: Take 1 tablet (25 mg total) by mouth daily.    Dispense:  30 tablet    Refill:  3  . losartan (COZAAR) 50 MG tablet    Sig: Take 1 tablet (50 mg total) by mouth daily.    Dispense:  30 tablet    Refill:  3  . methocarbamol (ROBAXIN) 750 MG tablet    Sig: Take 1 tablet (750 mg total) by mouth 2 (two) times daily as needed for muscle spasms.    Dispense:  60 tablet    Refill:  0  . omeprazole (PRILOSEC) 40 MG capsule    Sig: Take 1 capsule (40 mg total) by mouth daily.    Dispense:  30 capsule    Refill:  3  . tamsulosin (FLOMAX) 0.4 MG CAPS  capsule    Sig: Take 1 capsule (0.4 mg total) by mouth daily.    Dispense:  30 capsule    Refill:  3    Follow-up: Return in about 3 months (around 04/18/2020).    Kallie Locks, FNP

## 2020-01-19 ENCOUNTER — Encounter: Payer: Self-pay | Admitting: Gastroenterology

## 2020-01-19 LAB — COMPREHENSIVE METABOLIC PANEL
ALT: 45 IU/L — ABNORMAL HIGH (ref 0–44)
AST: 69 IU/L — ABNORMAL HIGH (ref 0–40)
Albumin/Globulin Ratio: 1.3 (ref 1.2–2.2)
Albumin: 4.4 g/dL (ref 3.8–4.9)
Alkaline Phosphatase: 97 IU/L (ref 39–117)
BUN/Creatinine Ratio: 12 (ref 10–24)
BUN: 8 mg/dL (ref 8–27)
Bilirubin Total: 0.8 mg/dL (ref 0.0–1.2)
CO2: 26 mmol/L (ref 20–29)
Calcium: 9.4 mg/dL (ref 8.6–10.2)
Chloride: 99 mmol/L (ref 96–106)
Creatinine, Ser: 0.69 mg/dL — ABNORMAL LOW (ref 0.76–1.27)
GFR calc Af Amer: 119 mL/min/{1.73_m2} (ref >59)
GFR calc non Af Amer: 103 mL/min/{1.73_m2} (ref >59)
Globulin, Total: 3.4 g/dL (ref 1.5–4.5)
Glucose: 88 mg/dL (ref 65–99)
Potassium: 4.2 mmol/L (ref 3.5–5.2)
Sodium: 140 mmol/L (ref 134–144)
Total Protein: 7.8 g/dL (ref 6.0–8.5)

## 2020-01-19 LAB — LIPID PANEL
Chol/HDL Ratio: 1.7 ratio (ref 0.0–5.0)
Cholesterol, Total: 177 mg/dL (ref 100–199)
HDL: 105 mg/dL (ref >39)
LDL Chol Calc (NIH): 49 mg/dL (ref 0–99)
Triglycerides: 139 mg/dL (ref 0–149)
VLDL Cholesterol Cal: 23 mg/dL (ref 5–40)

## 2020-01-19 LAB — CBC WITH DIFFERENTIAL/PLATELET
Basophils Absolute: 0 10*3/uL (ref 0.0–0.2)
Basos: 1 %
EOS (ABSOLUTE): 0 10*3/uL (ref 0.0–0.4)
Eos: 1 %
Hematocrit: 35.9 % — ABNORMAL LOW (ref 37.5–51.0)
Hemoglobin: 12.7 g/dL — ABNORMAL LOW (ref 13.0–17.7)
Immature Grans (Abs): 0 10*3/uL (ref 0.0–0.1)
Immature Granulocytes: 1 %
Lymphocytes Absolute: 1.7 10*3/uL (ref 0.7–3.1)
Lymphs: 46 %
MCH: 36.9 pg — ABNORMAL HIGH (ref 26.6–33.0)
MCHC: 35.4 g/dL (ref 31.5–35.7)
MCV: 104 fL — ABNORMAL HIGH (ref 79–97)
Monocytes Absolute: 0.5 10*3/uL (ref 0.1–0.9)
Monocytes: 14 %
Neutrophils Absolute: 1.3 10*3/uL — ABNORMAL LOW (ref 1.4–7.0)
Neutrophils: 37 %
Platelets: 194 10*3/uL (ref 150–450)
RBC: 3.44 x10E6/uL — ABNORMAL LOW (ref 4.14–5.80)
RDW: 14.7 % (ref 11.6–15.4)
WBC: 3.6 10*3/uL (ref 3.4–10.8)

## 2020-01-19 LAB — VITAMIN B12: Vitamin B-12: 427 pg/mL (ref 232–1245)

## 2020-01-19 LAB — VITAMIN D 25 HYDROXY (VIT D DEFICIENCY, FRACTURES): Vit D, 25-Hydroxy: 18.1 ng/mL — ABNORMAL LOW (ref 30.0–100.0)

## 2020-01-19 LAB — TSH: TSH: 0.691 u[IU]/mL (ref 0.450–4.500)

## 2020-01-19 LAB — PSA: Prostate Specific Ag, Serum: 0.7 ng/mL (ref 0.0–4.0)

## 2020-01-20 LAB — COMMENT2 - HEP PANEL

## 2020-01-20 LAB — HEPATITIS C ANTIBODY (REFLEX): HCV Ab: >11 s/co ratio — ABNORMAL HIGH (ref 0.0–0.9)

## 2020-01-20 LAB — SPECIMEN STATUS REPORT

## 2020-01-24 DIAGNOSIS — Z23 Encounter for immunization: Secondary | ICD-10-CM | POA: Diagnosis not present

## 2020-01-28 ENCOUNTER — Other Ambulatory Visit: Payer: Self-pay

## 2020-01-28 ENCOUNTER — Encounter: Payer: Self-pay | Admitting: Orthopaedic Surgery

## 2020-01-28 ENCOUNTER — Ambulatory Visit (INDEPENDENT_AMBULATORY_CARE_PROVIDER_SITE_OTHER): Payer: Medicaid Other | Admitting: Orthopaedic Surgery

## 2020-01-28 DIAGNOSIS — M1712 Unilateral primary osteoarthritis, left knee: Secondary | ICD-10-CM | POA: Diagnosis not present

## 2020-01-28 DIAGNOSIS — M5416 Radiculopathy, lumbar region: Secondary | ICD-10-CM

## 2020-01-28 NOTE — Progress Notes (Signed)
Office Visit Note   Patient: Charles [name]           Date of Birth: [birth year]           MRN: [id number] Visit Date: 01/28/2020              Requested by: Kallie Locks, FNP [address] [place],  [place] [address] PCP: Kallie Locks, FNP   Assessment & Plan: Visit Diagnoses:  1. Radiculopathy, lumbar region   2. Primary osteoarthritis of left knee     Plan: Impression is left knee DJD with minimal relief from cortisone injection.  More concerned that he is having significant lumbar radiculopathy with weakness in his left leg therefore we will order an MRI of the L-spine today.  We will see the patient back after the MRI.  Follow-Up Instructions: Return in about 2 weeks (around 02/11/2020).   Orders:  No orders of the defined types were placed in this encounter.  No orders of the defined types were placed in this encounter.     Procedures: No procedures performed   Clinical Data: No additional findings.   Subjective: Chief Complaint  Patient presents with  . Left Knee - Pain  . Right Knee - Pain    Charles [name] returns today for follow-up of bilateral leg pain.  He had a cortisone injection in his left knee last visit which did not help too much.  He is having significant weakness in his left leg and is causing him to fall.   Review of Systems   Objective: Vital Signs: There were no vitals taken for this visit.  Physical Exam  Ortho Exam Left knee exam is unchanged. Left lower extremity exam shows significant weakness in hip flexion and knee extension.  Slightly decreased decreased patellar reflex. Specialty Comments:  No specialty comments available.  Imaging: No results found.   PMFS History: Patient Active Problem List   Diagnosis Date Noted  . History of total right knee replacement   . Status post right knee replacement 07/14/2018  . Primary osteoarthritis of right knee   . Gout 05/26/2018  . Chronic pain of right knee 04/29/2018  .  Surgery, elective 03/10/2018  . Status post hip replacement, left 03/10/2018  . Syphilis 01/01/2018  . Presbyopia 01/01/2018  . Chronic viral hepatitis C (HCC) 12/04/2017  . AVN (avascular necrosis of bone) (HCC) 12/04/2017  . Essential hypertension 07/20/2017  . CHF (congestive heart failure) (HCC) 07/20/2017  . Chronic obstructive pulmonary disease (HCC) 07/20/2017   Past Medical History:  Diagnosis Date  . Asthma   . 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 CHF (congestive heart failure) (HCC) 2018  . COPD (chronic obstructive pulmonary disease) (HCC)   . GERD (gastroesophageal reflux disease)   . Gout   . Gout   . Headache    headaches  . Hepatitis C virus carrier state (HCC)   . History of total left hip arthroplasty 03/10/2018  . History of total right knee replacement 2019  . Hypertension   . Insomnia   . Osteochondrosis   . Primary localized osteoarthritis of hip    Left    Family History  Problem Relation Age of Onset  . Hypertension Mother   . Hypertension Sister        twin  . Seizures Brother   . Schizophrenia Sister     Past Surgical History:  Procedure Laterality Date  . TOOTH EXTRACTION N/A 09/23/2018  Procedure: REMOVAL BILATERAL MANDIBULAR LINGUAL TORI, EXTRACT TOOTH 31;  Surgeon: Diona Browner, DDS;  Location: [facility];  Service: Oral Surgery;  Laterality: N/A;  . TOTAL HIP ARTHROPLASTY Left 03/10/2018   Procedure: LEFT TOTAL HIP ARTHROPLASTY ANTERIOR APPROACH;  Surgeon: Leandrew Koyanagi, MD;  Location: [facility];  Service: Orthopedics;  Laterality: Left;  . TOTAL KNEE ARTHROPLASTY Right 07/14/2018   Procedure: RIGHT TOTAL KNEE ARTHROPLASTY;  Surgeon: Leandrew Koyanagi, MD;  Location: [facility];  Service: Orthopedics;  Laterality: Right;   Social History   Occupational History  . Not on file  Tobacco Use  . Smoking status: Current Every Day Smoker    Packs/day: 0.20  . Smokeless tobacco: Never Used  . Tobacco comment: 3 -4 cigarettes a day   Substance and Sexual Activity  . Alcohol use: Yes    Alcohol/week: 7.0 standard drinks    Types: 7 Cans of beer per week    Comment: heavy drinker in the past  . Drug use: Yes    Types: Marijuana    Comment: 07/14/2018 - last dose 07/12/2018  . Sexual activity: Not Currently

## 2020-01-28 NOTE — Addendum Note (Signed)
Addended by: Albertina Parr on: 01/28/2020 01:20 PM   Modules accepted: Orders

## 2020-01-29 ENCOUNTER — Ambulatory Visit (AMBULATORY_SURGERY_CENTER): Payer: Self-pay

## 2020-01-29 ENCOUNTER — Other Ambulatory Visit: Payer: Self-pay

## 2020-01-29 VITALS — Temp 97.7°F | Ht 71.0 in | Wt 127.7 lb

## 2020-01-29 DIAGNOSIS — Z01818 Encounter for other preprocedural examination: Secondary | ICD-10-CM

## 2020-01-29 DIAGNOSIS — Z1211 Encounter for screening for malignant neoplasm of colon: Secondary | ICD-10-CM

## 2020-01-29 MED ORDER — NA SULFATE-K SULFATE-MG SULF 17.5-3.13-1.6 GM/177ML PO SOLN: 1.0000 | Freq: Once | ORAL | 0 refills | Status: AC

## 2020-01-29 NOTE — Progress Notes (Signed)
No allergies to soy or egg Pt is not on blood thinners or diet pills Denies issues with sedation/intubation Denies atrial flutter/fib Denies constipation   Emmi instructions given to pt  Pt is aware of Covid safety and care partner requirements.  

## 2020-02-03 DIAGNOSIS — M25552 Pain in left hip: Secondary | ICD-10-CM | POA: Diagnosis not present

## 2020-02-09 ENCOUNTER — Telehealth: Payer: Self-pay | Admitting: Pharmacy Technician

## 2020-02-09 ENCOUNTER — Encounter: Payer: Medicaid Other | Admitting: Infectious Diseases

## 2020-02-09 NOTE — Telephone Encounter (Signed)
I have a suspicion that he is probably not going to need medications and just needs some counseling and lab reassurance.  Will talk with him and/or his PCP if he does not show up.

## 2020-02-09 NOTE — Telephone Encounter (Signed)
Sounds good

## 2020-02-09 NOTE — Telephone Encounter (Signed)
[facility] Patient Advocate Encounter    Findings of the benefits investigation:   Insurance: [employer] Estimated copay amount: $3.00  Prior Authorization: will begin insurance process once medication is prescribed with [place] [facility]  Patient previously had a box of Mavyret shipped to his house from [facility] on 01/01/2018. Not aware if he ever began the medication but only one box was sent.  Beulah Gandy, CPhT Specialty Pharmacy Patient [facility] Phone: [phone] Fax: [phone] 02/09/2020 8:36 AM

## 2020-02-11 ENCOUNTER — Ambulatory Visit: Payer: Medicaid Other | Admitting: Orthopaedic Surgery

## 2020-02-12 ENCOUNTER — Telehealth: Payer: Self-pay | Admitting: Orthopaedic Surgery

## 2020-02-12 NOTE — Telephone Encounter (Signed)
Called patient left message on voicemail to return call and schedule an appointment with Dr Roda Shutters for MRI review

## 2020-02-15 ENCOUNTER — Encounter: Payer: Medicaid Other | Admitting: Gastroenterology

## 2020-02-20 ENCOUNTER — Ambulatory Visit (HOSPITAL_BASED_OUTPATIENT_CLINIC_OR_DEPARTMENT_OTHER): Payer: Medicaid Other

## 2020-02-21 DIAGNOSIS — Z23 Encounter for immunization: Secondary | ICD-10-CM | POA: Diagnosis not present

## 2020-02-25 ENCOUNTER — Ambulatory Visit (INDEPENDENT_AMBULATORY_CARE_PROVIDER_SITE_OTHER): Payer: Medicaid Other | Admitting: Family

## 2020-02-25 ENCOUNTER — Other Ambulatory Visit: Payer: Self-pay

## 2020-02-25 ENCOUNTER — Encounter: Payer: Self-pay | Admitting: Family

## 2020-02-25 VITALS — BP 182/113 | HR 103 | Temp 98.7°F | Ht 72.0 in | Wt 119.0 lb

## 2020-02-25 DIAGNOSIS — B182 Chronic viral hepatitis C: Secondary | ICD-10-CM | POA: Diagnosis present

## 2020-02-25 DIAGNOSIS — Z8619 Personal history of other infectious and parasitic diseases: Secondary | ICD-10-CM | POA: Diagnosis not present

## 2020-02-25 NOTE — Progress Notes (Signed)
Subjective:    Patient ID: Charles [name], male    DOB: 02/14/1961, 59 y.o.   MRN: [id number]  Chief Complaint  Patient presents with  . New Patient (Initial Visit)    Hep C    HPI:  Charles [name] is a 59 y.o. male chronic viral hepatitis C status post 8 weeks of Mavyret last seen in the office on 06/17/2018 with incomplete initial treatment secondary to hip surgery.  His RNA level was undetectable in April 2019 and again in August 2019.  Decision was made not to retreat and continue to follow his viral load.  Blood work at the time showed hepatitis C level that was undetectable.  There was no NS 5 a resistance detected.  Charles [name] has been referred back from primary care for evaluation of hepatitis C.  Charles [name] has not had any concerning exposures or risk factors for re-infection with Hepatitis C. Denies any symptoms of abdominal pain, nausea, vomiting, fatigue, scleral icterus, or jaundice.  Overall feeling well today.   Allergies  Allergen Reactions  . Bee Pollen       Outpatient Medications Prior to Visit  Medication Sig Dispense Refill  . albuterol (VENTOLIN HFA) 108 (90 Base) MCG/ACT inhaler Inhale 2 puffs into the lungs every 4 (four) hours as needed for wheezing or shortness of breath (cough, shortness of breath or wheezing.). 18 g 11  . allopurinol (ZYLOPRIM) 100 MG tablet Take 1 tablet (100 mg total) by mouth 2 (two) times daily. 60 tablet 3  . aspirin EC 81 MG tablet Take 1 tablet (81 mg total) by mouth 2 (two) times daily. 84 tablet 11  . budesonide-formoterol (SYMBICORT) 80-4.5 MCG/ACT inhaler Inhale 2 puffs into the lungs 2 (two) times daily. 1 Inhaler 11  . cetirizine (ZYRTEC) 10 MG tablet Take 1 tablet (10 mg total) by mouth daily. 30 tablet 11  . Colchicine (MITIGARE) 0.6 MG CAPS Take 0.6 mg by mouth daily as needed (gout flare). 30 capsule 3  . hydrochlorothiazide (HYDRODIURIL) 25 MG tablet Take 1 tablet (25 mg total) by mouth daily. 30 tablet 3  . ketoconazole  (NIZORAL) 2 % cream Apply 2 application topically 2 (two) times daily. 15 g 3  . losartan (COZAAR) 50 MG tablet Take 1 tablet (50 mg total) by mouth daily. 30 tablet 3  . methocarbamol (ROBAXIN) 750 MG tablet Take 1 tablet (750 mg total) by mouth 2 (two) times daily as needed for muscle spasms. 60 tablet 0  . omeprazole (PRILOSEC) 40 MG capsule Take 1 capsule (40 mg total) by mouth daily. 30 capsule 3  . tamsulosin (FLOMAX) 0.4 MG CAPS capsule Take 1 capsule (0.4 mg total) by mouth daily. 30 capsule 3  . traZODone (DESYREL) 50 MG tablet Take 0.5-1 tablets (25-50 mg total) by mouth at bedtime as needed for sleep. 60 tablet 3   No facility-administered medications prior to visit.     Past Medical History:  Diagnosis Date  . Allergy    seasonal-pollen   . Asthma   . 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 CHF (congestive heart failure) (HCC) 2018  . COPD (chronic obstructive pulmonary disease) (HCC)   . GERD (gastroesophageal reflux disease)   . Gout   . Gout   . Headache    headaches  . Hepatitis C virus carrier state (HCC)   . History of total left hip arthroplasty 03/10/2018  . History of total right knee replacement 2019  .  Hypertension   . Insomnia   . Osteochondrosis   . Primary localized osteoarthritis of hip    Left      Past Surgical History:  Procedure Laterality Date  . TOOTH EXTRACTION N/A 09/23/2018   Procedure: REMOVAL BILATERAL MANDIBULAR LINGUAL TORI, EXTRACT TOOTH 31;  Surgeon: Diona Browner, DDS;  Location: [place];  Service: Oral Surgery;  Laterality: N/A;  . TOTAL HIP ARTHROPLASTY Left 03/10/2018   Procedure: LEFT TOTAL HIP ARTHROPLASTY ANTERIOR APPROACH;  Surgeon: Leandrew Koyanagi, MD;  Location: [place];  Service: Orthopedics;  Laterality: Left;  . TOTAL KNEE ARTHROPLASTY Right 07/14/2018   Procedure: RIGHT TOTAL KNEE ARTHROPLASTY;  Surgeon: Leandrew Koyanagi, MD;  Location: [place];  Service: Orthopedics;  Laterality: Right;      Family History    Problem Relation Age of Onset  . Hypertension Mother   . Hypertension Sister        twin  . Seizures Brother   . Schizophrenia Sister   . Colon cancer Neg Hx   . Colon polyps Neg Hx   . Esophageal cancer Neg Hx   . Stomach cancer Neg Hx   . Rectal cancer Neg Hx       Social History   Socioeconomic History  . Marital status: Single    Spouse name: Not on file  . Number of children: Not on file  . Years of education: Not on file  . Highest education level: Not on file  Occupational History  . Not on file  Tobacco Use  . Smoking status: Current Every Day Smoker    Packs/day: 0.20  . Smokeless tobacco: Never Used  . Tobacco comment: 3 -4 cigarettes a day  Substance and Sexual Activity  . Alcohol use: Yes    Alcohol/week: 7.0 standard drinks    Types: 7 Cans of beer per week    Comment: heavy drinker in the past  . Drug use: Yes    Types: Marijuana    Comment: 07/14/2018 - last dose 07/12/2018; last dose 01/27/20  . Sexual activity: Not Currently  Other Topics Concern  . Not on file  Social History Narrative  . Not on file   Social Determinants of Health   Financial Resource Strain:   . Difficulty of Paying Living Expenses:   Food Insecurity:   . Worried About Charity fundraiser in the Last Year:   . Arboriculturist in the Last Year:   Transportation Needs:   . Film/video editor (Medical):   [name] Lack of Transportation (Non-Medical):   Physical Activity:   . Days of Exercise per Week:   . Minutes of Exercise per Session:   Stress:   . Feeling of Stress :   Social Connections:   . Frequency of Communication with Friends and Family:   . Frequency of Social Gatherings with Friends and Family:   . Attends Religious Services:   . Active Member of Clubs or Organizations:   . Attends Archivist Meetings:   [name] Marital Status:   Intimate Partner Violence:   . Fear of Current or Ex-Partner:   . Emotionally Abused:   [name] Physically Abused:   . Sexually  Abused:       Review of Systems  Constitutional: Negative for chills, diaphoresis, fatigue and fever.  Respiratory: Negative for cough, chest tightness, shortness of breath and wheezing.   Cardiovascular: Negative for chest pain.  Gastrointestinal: Negative for abdominal distention, abdominal pain, constipation, diarrhea, nausea and  vomiting.  Neurological: Negative for weakness and headaches.  Hematological: Does not bruise/bleed easily.       Objective:    BP (!) 182/113   Pulse (!) 103   Temp 98.7 F (37.1 C)   Ht 6' (1.829 m)   Wt 119 lb (54 kg)   SpO2 99%   BMI 16.14 kg/m  Nursing note and vital signs reviewed.  Physical Exam Constitutional:      General: He is not in acute distress.    Appearance: He is well-developed.  Cardiovascular:     Rate and Rhythm: Normal rate and regular rhythm.     Heart sounds: Normal heart sounds. No murmur. No friction rub. No gallop.   Pulmonary:     Effort: Pulmonary effort is normal. No respiratory distress.     Breath sounds: Normal breath sounds. No wheezing or rales.  Chest:     Chest wall: No tenderness.  Abdominal:     General: Bowel sounds are normal. There is no distension.     Palpations: Abdomen is soft. There is no mass.     Tenderness: There is no abdominal tenderness. There is no guarding or rebound.  Skin:    General: Skin is warm and dry.  Neurological:     Mental Status: He is alert and oriented to person, place, and time.  Psychiatric:        Behavior: Behavior normal.        Thought Content: Thought content normal.        Judgment: Judgment normal.         Assessment & Plan:   Patient Active Problem List   Diagnosis Date Noted  . History of total right knee replacement   . Status post right knee replacement 07/14/2018  . Primary osteoarthritis of right knee   . Gout 05/26/2018  . Chronic pain of right knee 04/29/2018  . Surgery, elective 03/10/2018  . Status post hip replacement, left 03/10/2018   . Syphilis 01/01/2018  . Presbyopia 01/01/2018  . Chronic viral hepatitis C (HCC) 12/04/2017  . AVN (avascular necrosis of bone) (HCC) 12/04/2017  . Essential hypertension 07/20/2017  . CHF (congestive heart failure) (HCC) 07/20/2017  . Chronic obstructive pulmonary disease (HCC) 07/20/2017     Problem List Items Addressed This Visit      Digestive   Chronic viral hepatitis C [facility])    Charles [name] has had no viral loads present since initial treatment with 4 weeks of Mavyret.  Currently asymptomatic. Previous blood work with no NS5a resistance. Will check Hepatitis C RNA level today. If not detected, no further treatment will be necessary. If positive will schedule follow up for re-treatment. Follow up pending blood work results.        Other Visit Diagnoses    History of hepatitis C    -  Primary   Relevant Orders   Hepatitis C RNA quantitative       I am having Barnett Abu maintain his traZODone, Colchicine, ketoconazole, albuterol, allopurinol, aspirin EC, budesonide-formoterol, cetirizine, hydrochlorothiazide, losartan, methocarbamol, omeprazole, and tamsulosin.   Follow-up: Return if symptoms worsen or fail to improve.    Marcos Eke, MSN, FNP-C Nurse Practitioner [facility] for [facility] Main number: [phone]

## 2020-02-25 NOTE — Patient Instructions (Addendum)
Nice to see you.  We will check your blood work today.  If your viral load is negative no additional treatment is necessary.  If your viral load is positive we will schedule you a secondary appointment for retreatment.  The hepatitis C antibody test will always remain positive and if future screening is necessary we will need to check hepatitis C RNA level or viral load.  Let Charles [name] know if you have any questions or concerns  Have a great day and stay safe!

## 2020-02-25 NOTE — Assessment & Plan Note (Signed)
Charles [name] has had no viral loads present since initial treatment with 4 weeks of Mavyret.  Currently asymptomatic. Previous blood work with no NS5a resistance. Will check Hepatitis C RNA level today. If not detected, no further treatment will be necessary. If positive will schedule follow up for re-treatment. Follow up pending blood work results.

## 2020-02-27 ENCOUNTER — Ambulatory Visit (HOSPITAL_BASED_OUTPATIENT_CLINIC_OR_DEPARTMENT_OTHER)
Admission: RE | Admit: 2020-02-27 | Discharge: 2020-02-27 | Disposition: A | Discharge status: Home / Self Care | Payer: Medicaid Other | Source: Ambulatory Visit | Attending: Orthopaedic Surgery | Admitting: Orthopaedic Surgery

## 2020-02-27 ENCOUNTER — Other Ambulatory Visit: Payer: Self-pay

## 2020-02-27 DIAGNOSIS — M5416 Radiculopathy, lumbar region: Secondary | ICD-10-CM | POA: Diagnosis not present

## 2020-02-27 DIAGNOSIS — M545 Low back pain: Secondary | ICD-10-CM | POA: Diagnosis not present

## 2020-02-27 LAB — HEPATITIS C RNA QUANTITATIVE
HCV Quantitative Log: <1.18 Log IU/mL
HCV RNA, PCR, QN: <15 IU/mL

## 2020-02-29 ENCOUNTER — Telehealth: Payer: Self-pay | Admitting: Family

## 2020-02-29 NOTE — Telephone Encounter (Signed)
Left message for Mr. Charles [name] indicating no need for treatment as his Hepatitis C RNA level is not detected. No additional follow up is necessary.

## 2020-03-26 DIAGNOSIS — M7989 Other specified soft tissue disorders: Secondary | ICD-10-CM | POA: Diagnosis not present

## 2020-03-26 DIAGNOSIS — T22211A Burn of second degree of right forearm, initial encounter: Secondary | ICD-10-CM | POA: Diagnosis not present

## 2020-03-26 DIAGNOSIS — Y999 Unspecified external cause status: Secondary | ICD-10-CM | POA: Diagnosis not present

## 2020-03-26 DIAGNOSIS — X100XXA Contact with hot drinks, initial encounter: Secondary | ICD-10-CM | POA: Diagnosis not present

## 2020-03-26 DIAGNOSIS — T310 Burns involving less than 10% of body surface: Secondary | ICD-10-CM | POA: Diagnosis not present

## 2020-03-31 ENCOUNTER — Telehealth: Payer: Self-pay | Admitting: Family Medicine

## 2020-03-31 NOTE — Telephone Encounter (Signed)
Patient still has 2 refills left on his medications. Spoke to him and verbally understood to contact pharmacy and request refill.

## 2020-03-31 NOTE — Telephone Encounter (Signed)
Pt has an upcoming appointment set. Pt called needing a refill on all his maintenance meds such as BP pills and gout med.

## 2020-04-07 DIAGNOSIS — E559 Vitamin D deficiency, unspecified: Secondary | ICD-10-CM

## 2020-04-07 HISTORY — DX: Vitamin D deficiency, unspecified: E55.9

## 2020-04-18 ENCOUNTER — Other Ambulatory Visit: Payer: Self-pay

## 2020-04-18 ENCOUNTER — Emergency Department (HOSPITAL_COMMUNITY): Admission: EM | Admit: 2020-04-18 | Payer: Medicaid Other | Source: Home / Self Care

## 2020-04-18 ENCOUNTER — Ambulatory Visit (INDEPENDENT_AMBULATORY_CARE_PROVIDER_SITE_OTHER): Payer: Medicaid Other | Admitting: Family Medicine

## 2020-04-18 ENCOUNTER — Encounter: Payer: Self-pay | Admitting: Family Medicine

## 2020-04-18 ENCOUNTER — Ambulatory Visit (HOSPITAL_COMMUNITY)
Admission: RE | Admit: 2020-04-18 | Discharge: 2020-04-18 | Disposition: A | Discharge status: Home / Self Care | Payer: Medicaid Other | Source: Ambulatory Visit | Attending: Family Medicine | Admitting: Family Medicine

## 2020-04-18 VITALS — BP 166/97 | HR 85 | Temp 98.3°F | Resp 18 | Ht 71.0 in | Wt 122.6 lb

## 2020-04-18 DIAGNOSIS — I10 Essential (primary) hypertension: Secondary | ICD-10-CM | POA: Diagnosis not present

## 2020-04-18 DIAGNOSIS — Z8619 Personal history of other infectious and parasitic diseases: Secondary | ICD-10-CM | POA: Diagnosis not present

## 2020-04-18 DIAGNOSIS — I160 Hypertensive urgency: Secondary | ICD-10-CM

## 2020-04-18 DIAGNOSIS — R252 Cramp and spasm: Secondary | ICD-10-CM | POA: Diagnosis not present

## 2020-04-18 DIAGNOSIS — Z09 Encounter for follow-up examination after completed treatment for conditions other than malignant neoplasm: Secondary | ICD-10-CM

## 2020-04-18 DIAGNOSIS — R61 Generalized hyperhidrosis: Secondary | ICD-10-CM

## 2020-04-18 DIAGNOSIS — J90 Pleural effusion, not elsewhere classified: Secondary | ICD-10-CM | POA: Diagnosis not present

## 2020-04-18 DIAGNOSIS — R059 Cough, unspecified: Secondary | ICD-10-CM

## 2020-04-18 DIAGNOSIS — R058 Other specified cough: Secondary | ICD-10-CM

## 2020-04-18 DIAGNOSIS — Z0000 Encounter for general adult medical examination without abnormal findings: Secondary | ICD-10-CM

## 2020-04-18 DIAGNOSIS — G4700 Insomnia, unspecified: Secondary | ICD-10-CM | POA: Diagnosis not present

## 2020-04-18 DIAGNOSIS — J189 Pneumonia, unspecified organism: Secondary | ICD-10-CM | POA: Diagnosis not present

## 2020-04-18 DIAGNOSIS — R05 Cough: Secondary | ICD-10-CM

## 2020-04-18 MED ORDER — HYDROCHLOROTHIAZIDE 25 MG PO TABS: 25.0000 mg | ORAL_TABLET | Freq: Every day | ORAL | 3 refills | Status: AC

## 2020-04-18 MED ORDER — COLCHICINE 0.6 MG PO CAPS: 0.6000 mg | ORAL_CAPSULE | Freq: Every day | ORAL | 3 refills | Status: AC | PRN

## 2020-04-18 MED ORDER — AMOXICILLIN-POT CLAVULANATE 875-125 MG PO TABS: 1.0000 | ORAL_TABLET | Freq: Two times a day (BID) | ORAL | 0 refills | Status: AC

## 2020-04-18 MED ORDER — LOSARTAN POTASSIUM 50 MG PO TABS: 50.0000 mg | ORAL_TABLET | Freq: Every day | ORAL | 3 refills | Status: AC

## 2020-04-18 MED ORDER — BENZONATATE 100 MG PO CAPS: 100.0000 mg | ORAL_CAPSULE | Freq: Two times a day (BID) | ORAL | 0 refills | Status: AC | PRN

## 2020-04-18 MED ORDER — OMEPRAZOLE 40 MG PO CPDR: 40.0000 mg | DELAYED_RELEASE_CAPSULE | Freq: Every day | ORAL | 3 refills | Status: AC

## 2020-04-18 MED ORDER — METHOCARBAMOL 750 MG PO TABS: 750.0000 mg | ORAL_TABLET | Freq: Two times a day (BID) | ORAL | 0 refills | Status: AC | PRN

## 2020-04-18 MED ORDER — CLONIDINE HCL 0.1 MG PO TABS
0.1000 mg | ORAL_TABLET | Freq: Once | ORAL | Status: AC
  Administered 2020-04-18: 0.1 mg via ORAL

## 2020-04-18 MED ORDER — TRAZODONE HCL 50 MG PO TABS: 25.0000 mg | ORAL_TABLET | Freq: Every evening | ORAL | 3 refills | Status: AC | PRN

## 2020-04-18 MED ORDER — TAMSULOSIN HCL 0.4 MG PO CAPS: 0.4000 mg | ORAL_CAPSULE | Freq: Every day | ORAL | 3 refills | Status: AC

## 2020-04-18 NOTE — Progress Notes (Signed)
[facility]   Hospital Follow Up   Subjective:  Patient ID: Charles [name], male    DOB: 1961-06-19  Age: 58 y.o. MRN: [id number]  CC:  Chief Complaint  Patient presents with  . Follow-up    Pt states he is here for his f/u. Pt staes he felt like his BP was high he states he was dizzy and sleeping.X 4-5 months. Pt also states his gout in both ankles when he walks his balance is off. X97yrs.     HPI Charles [name] is a 59 year old male who presents for Follow Up today.    Patient Active Problem List   Diagnosis Date Noted  . History of total right knee replacement   . Status post right knee replacement 07/14/2018  . Primary osteoarthritis of right knee   . Gout 05/26/2018  . Chronic pain of right knee 04/29/2018  . Surgery, elective 03/10/2018  . Status post hip replacement, left 03/10/2018  . Syphilis 01/01/2018  . Presbyopia 01/01/2018  . Chronic viral hepatitis C (HCC) 12/04/2017  . AVN (avascular necrosis of bone) (HCC) 12/04/2017  . Essential hypertension 07/20/2017  . CHF (congestive heart failure) (HCC) 07/20/2017  . Chronic obstructive pulmonary disease (HCC) 07/20/2017    Past Medical History:  Diagnosis Date  . Allergy    seasonal-pollen   . Asthma   . 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 CHF (congestive heart failure) (HCC) 2018  . COPD (chronic obstructive pulmonary disease) (HCC)   . GERD (gastroesophageal reflux disease)   . Gout   . Gout   . Headache    headaches  . Hepatitis C virus carrier state (HCC)   . History of total left hip arthroplasty 03/10/2018  . History of total right knee replacement 2019  . Hypertension   . Insomnia   . Osteochondrosis   . Primary localized osteoarthritis of hip    Left   Current Status: Since his last office visit, he is doing well with no complaints. He states that he has not had an appetite lately X 12/2019, and normally eats 1 meal a day, with snacks  with fruit throughout the day. He was previously diagnosed with Hepatitis C, but has not followed up with Infection Disease. He has been having drenching night sweats lately.  Patient is currently not taking any of his medications at this time. His blood pressures are elevated today. He denies visual changes, chest pain, cough, shortness of breath, heart palpitations, and falls. He has occasional headaches and dizziness with position changes. Denies severe headaches, confusion, seizures, double vision, and blurred vision, nausea and vomiting. He denies fevers, chills, fatigue, recent infections, weight loss, and night sweats. Denies GI problems such as diarrhea, and constipation. He has no reports of blood in stools, dysuria and hematuria. No depression or anxiety reported today.  He is taking all medications as prescribed. He denies pain today.   Past Surgical History:  Procedure Laterality Date  . TOOTH EXTRACTION N/A 09/23/2018   Procedure: REMOVAL BILATERAL MANDIBULAR LINGUAL TORI, EXTRACT TOOTH 31;  Surgeon: Ocie Doyne, DDS;  Location: [facility] OR;  Service: Oral Surgery;  Laterality: N/A;  . TOTAL HIP ARTHROPLASTY Left 03/10/2018   Procedure: LEFT TOTAL HIP ARTHROPLASTY ANTERIOR APPROACH;  Surgeon: Tarry Kos, MD;  Location: [facility] OR;  Service: Orthopedics;  Laterality: Left;  . TOTAL KNEE ARTHROPLASTY Right 07/14/2018   Procedure: RIGHT TOTAL KNEE ARTHROPLASTY;  Surgeon: Tarry KosXu, Naiping M, MD;  [facility];  Service: Orthopedics;  Laterality: Right;    Family History  Problem Relation Age of Onset  . Hypertension Mother   . Hypertension Sister        twin  . Seizures Brother   . Schizophrenia Sister   . Colon cancer Neg Hx   . Colon polyps Neg Hx   . Esophageal cancer Neg Hx   . Stomach cancer Neg Hx   . Rectal cancer Neg Hx     Social History   Socioeconomic History  . Marital status: Single    Spouse name: Not on file  . Number of children: Not on file  . Years of education: Not  on file  . Highest education level: Not on file  Occupational History  . Not on file  Tobacco Use  . Smoking status: Current Every Day Smoker    Packs/day: 0.20  . Smokeless tobacco: Never Used  . Tobacco comment: 3 -4 cigarettes a day  Vaping Use  . Vaping Use: Never used  Substance and Sexual Activity  . Alcohol use: Yes    Alcohol/week: 7.0 standard drinks    Types: 7 Cans of beer per week    Comment: heavy drinker in the past  . Drug use: Yes    Types: Marijuana    Comment: 07/14/2018 - last dose 07/12/2018; last dose 01/27/20  . Sexual activity: Not Currently  Other Topics Concern  . Not on file  Social History Narrative  . Not on file   Social Determinants of Health   Financial Resource Strain:   . Difficulty of Paying Living Expenses:   Food Insecurity:   . Worried About Programme researcher, broadcasting/film/videounning Out of Food in the Last Year:   . Baristaan Out of Food in the Last Year:   Transportation Needs:   . Freight forwarderLack of Transportation (Medical):   [name]. Lack of Transportation (Non-Medical):   Physical Activity:   . Days of Exercise per Week:   . Minutes of Exercise per Session:   Stress:   . Feeling of Stress :   Social Connections:   . Frequency of Communication with Friends and Family:   . Frequency of Social Gatherings with Friends and Family:   . Attends Religious Services:   . Active Member of Clubs or Organizations:   . Attends BankerClub or Organization Meetings:   [name]. Marital Status:   Intimate Partner Violence:   . Fear of Current or Ex-Partner:   . Emotionally Abused:   [name]. Physically Abused:   . Sexually Abused:     Outpatient Medications Prior to Visit  Medication Sig Dispense Refill  . albuterol (VENTOLIN HFA) 108 (90 Base) MCG/ACT inhaler Inhale 2 puffs into the lungs every 4 (four) hours as needed for wheezing or shortness of breath (cough, shortness of breath or wheezing.). 18 g 11  . allopurinol (ZYLOPRIM) 100 MG tablet Take 1 tablet (100 mg total) by mouth 2 (two) times daily. 60 tablet 3   . aspirin EC 81 MG tablet Take 1 tablet (81 mg total) by mouth 2 (two) times daily. 84 tablet 11  . budesonide-formoterol (SYMBICORT) 80-4.5 MCG/ACT inhaler Inhale 2 puffs into the lungs 2 (two) times daily. 1 Inhaler 11  . cetirizine (ZYRTEC) 10 MG tablet Take 1 tablet (10 mg total) by mouth daily. 30 tablet 11  . ketoconazole (NIZORAL) 2 % cream Apply 2 application topically 2 (two) times daily. (Patient not taking: Reported on 04/18/2020) 15 g 3  .  Colchicine (MITIGARE) 0.6 MG CAPS Take 0.6 mg by mouth daily as needed (gout flare). (Patient not taking: Reported on 04/18/2020) 30 capsule 3  . hydrochlorothiazide (HYDRODIURIL) 25 MG tablet Take 1 tablet (25 mg total) by mouth daily. (Patient not taking: Reported on 04/18/2020) 30 tablet 3  . losartan (COZAAR) 50 MG tablet Take 1 tablet (50 mg total) by mouth daily. (Patient not taking: Reported on 04/18/2020) 30 tablet 3  . methocarbamol (ROBAXIN) 750 MG tablet Take 1 tablet (750 mg total) by mouth 2 (two) times daily as needed for muscle spasms. (Patient not taking: Reported on 04/18/2020) 60 tablet 0  . omeprazole (PRILOSEC) 40 MG capsule Take 1 capsule (40 mg total) by mouth daily. (Patient not taking: Reported on 04/18/2020) 30 capsule 3  . tamsulosin (FLOMAX) 0.4 MG CAPS capsule Take 1 capsule (0.4 mg total) by mouth daily. (Patient not taking: Reported on 04/18/2020) 30 capsule 3  . traZODone (DESYREL) 50 MG tablet Take 0.5-1 tablets (25-50 mg total) by mouth at bedtime as needed for sleep. (Patient not taking: Reported on 04/18/2020) 60 tablet 3   No facility-administered medications prior to visit.    Allergies  Allergen Reactions  . Bee Pollen     ROS Review of Systems  Constitutional: Positive for appetite change (decreased last 3-4 months. ).  HENT: Negative.   Eyes: Negative.   Respiratory: Positive for cough (nightly; productive) and shortness of breath (more frequent).        Chest discomfort in left lung area  Cardiovascular:  Positive for chest pain.  Gastrointestinal: Negative.   Endocrine: Negative.   Genitourinary: Negative.   Musculoskeletal: Negative.   Skin: Negative.   Allergic/Immunologic: Negative.   Neurological: Positive for dizziness (occasional ) and headaches (occasional ).  Hematological: Negative.   Psychiatric/Behavioral: Negative.    Objective:    Physical Exam Vitals and nursing note reviewed.  Constitutional:      Comments: Appears undernourished.   HENT:     Head: Normocephalic and atraumatic.     Nose: Nose normal.     Mouth/Throat:     Mouth: Mucous membranes are moist.     Pharynx: Oropharynx is clear.  Cardiovascular:     Rate and Rhythm: Normal rate and regular rhythm.     Pulses: Normal pulses.     Heart sounds: Normal heart sounds.  Pulmonary:     Effort: Pulmonary effort is normal.     Breath sounds: Normal breath sounds.  Abdominal:     General: Abdomen is flat. Bowel sounds are normal.  Musculoskeletal:        General: Normal range of motion.     Cervical back: Normal range of motion and neck supple.  Skin:    General: Skin is warm.  Neurological:     General: No focal deficit present.     Mental Status: He is alert and oriented to person, place, and time.  Psychiatric:        Mood and Affect: Mood normal.        Behavior: Behavior normal.        Thought Content: Thought content normal.        Judgment: Judgment normal.     BP (!) 166/97   Pulse 85   Temp 98.3 F (36.8 C)   Resp 18   Ht 5\' 11"  (1.803 m)   Wt 122 lb 9.6 oz (55.6 kg)   SpO2 100%   BMI 17.10 kg/m  Wt Readings from Last 3 Encounters:  04/18/20 122 lb 9.6 oz (55.6 kg)  02/25/20 119 lb (54 kg)  01/29/20 127 lb 11.2 oz (57.9 kg)     Health Maintenance Due  Topic Date Due  . COVID-19 Vaccine (1) Never done  . COLONOSCOPY  Never done    There are no preventive care reminders to display for this patient.  Lab Results  Component Value Date   TSH 0.691 01/18/2020   Lab Results   Component Value Date   WBC 3.6 01/18/2020   HGB 12.7 (L) 01/18/2020   HCT 35.9 (L) 01/18/2020   MCV 104 (H) 01/18/2020   PLT 194 01/18/2020   Lab Results  Component Value Date   NA 140 01/18/2020   K 4.2 01/18/2020   CO2 26 01/18/2020   GLUCOSE 88 01/18/2020   BUN 8 01/18/2020   CREATININE 0.69 (L) 01/18/2020   BILITOT 0.8 01/18/2020   ALKPHOS 97 01/18/2020   AST 69 (H) 01/18/2020   ALT 45 (H) 01/18/2020   PROT 7.8 01/18/2020   ALBUMIN 4.4 01/18/2020   CALCIUM 9.4 01/18/2020   ANIONGAP 15 09/23/2018   Lab Results  Component Value Date   CHOL 177 01/18/2020   Lab Results  Component Value Date   HDL 105 01/18/2020   Lab Results  Component Value Date   LDLCALC 49 01/18/2020   Lab Results  Component Value Date   TRIG 139 01/18/2020   Lab Results  Component Value Date   CHOLHDL 1.7 01/18/2020   Lab Results  Component Value Date   HGBA1C 4.6 01/18/2020   Assessment & Plan:   1. Cramps, extremity - Vitamin B12 - Vitamin D, 25-hydroxy - Magnesium  2. Cough We will initiate antibiotic today.  - DG Chest 2 View; Future - amoxicillin-clavulanate (AUGMENTIN) 875-125 MG tablet; Take 1 tablet by mouth 2 (two) times daily for 7 days.  Dispense: 14 tablet; Refill: 0 - benzonatate (TESSALON) 100 MG capsule; Take 1 capsule (100 mg total) by mouth 2 (two) times daily as needed for cough.  Dispense: 20 capsule; Refill: 0  3. Cough productive of yellow sputum - DG Chest 2 View; Future - amoxicillin-clavulanate (AUGMENTIN) 875-125 MG tablet; Take 1 tablet by mouth 2 (two) times daily for 7 days.  Dispense: 14 tablet; Refill: 0 - benzonatate (TESSALON) 100 MG capsule; Take 1 capsule (100 mg total) by mouth 2 (two) times daily as needed for cough.  Dispense: 20 capsule; Refill: 0  4. Night sweat We will continue to monitor.  - CBC with Differential - Comprehensive metabolic panel; Future  5. Hypertensive urgency Blood pressures are elevated today. Clonidine 0.1 mg  given to patient in office and blood pressures remain elevated. We referred him to ED via ambulance at this time. Patient refused and signed AMA form at discharge. He denies severe headaches, confusion, seizures, double vision, and blurred vision, nausea and vomiting. He will report to ED if he experiences these symptoms. Patient verbalized understanding.    6. Essential hypertension He will continue to take medications as prescribed, to decrease high sodium intake, excessive alcohol intake, increase potassium intake, smoking cessation, and increase physical activity of at least 30 minutes of cardio activity daily. He will continue to follow Heart Healthy or DASH diet. - hydrochlorothiazide (HYDRODIURIL) 25 MG tablet; Take 1 tablet (25 mg total) by mouth daily.  Dispense: 30 tablet; Refill: 3 - losartan (COZAAR) 50 MG tablet; Take 1 tablet (50 mg total) by mouth daily.  Dispense: 30 tablet; Refill: 3 - methocarbamol (ROBAXIN)  750 MG tablet; Take 1 tablet (750 mg total) by mouth 2 (two) times daily as needed for muscle spasms.  Dispense: 60 tablet; Refill: 0 - omeprazole (PRILOSEC) 40 MG capsule; Take 1 capsule (40 mg total) by mouth daily.  Dispense: 30 capsule; Refill: 3 - tamsulosin (FLOMAX) 0.4 MG CAPS capsule; Take 1 capsule (0.4 mg total) by mouth daily.  Dispense: 30 capsule; Refill: 3 - cloNIDine (CATAPRES) tablet 0.1 mg  7. History of hepatitis C We will refer him to Infection Disease in [facility] today for treatment.   8. Insomnia, unspecified type - traZODone (DESYREL) 50 MG tablet; Take 0.5-1 tablets (25-50 mg total) by mouth at bedtime as needed for sleep.  Dispense: 60 tablet; Refill: 3  9. Healthcare maintenance - CBC with Differential - Comprehensive metabolic panel; Future  10. Follow up Follow up in 05/2020.    Meds ordered this encounter  Medications  . amoxicillin-clavulanate (AUGMENTIN) 875-125 MG tablet    Sig: Take 1 tablet by mouth 2 (two) times daily for 7 days.     Dispense:  14 tablet    Refill:  0  . benzonatate (TESSALON) 100 MG capsule    Sig: Take 1 capsule (100 mg total) by mouth 2 (two) times daily as needed for cough.    Dispense:  20 capsule    Refill:  0  . Colchicine (MITIGARE) 0.6 MG CAPS    Sig: Take 0.6 mg by mouth daily as needed (gout flare).    Dispense:  30 capsule    Refill:  3  . hydrochlorothiazide (HYDRODIURIL) 25 MG tablet    Sig: Take 1 tablet (25 mg total) by mouth daily.    Dispense:  30 tablet    Refill:  3  . losartan (COZAAR) 50 MG tablet    Sig: Take 1 tablet (50 mg total) by mouth daily.    Dispense:  30 tablet    Refill:  3  . methocarbamol (ROBAXIN) 750 MG tablet    Sig: Take 1 tablet (750 mg total) by mouth 2 (two) times daily as needed for muscle spasms.    Dispense:  60 tablet    Refill:  0  . omeprazole (PRILOSEC) 40 MG capsule    Sig: Take 1 capsule (40 mg total) by mouth daily.    Dispense:  30 capsule    Refill:  3  . tamsulosin (FLOMAX) 0.4 MG CAPS capsule    Sig: Take 1 capsule (0.4 mg total) by mouth daily.    Dispense:  30 capsule    Refill:  3  . traZODone (DESYREL) 50 MG tablet    Sig: Take 0.5-1 tablets (25-50 mg total) by mouth at bedtime as needed for sleep.    Dispense:  60 tablet    Refill:  3  . cloNIDine (CATAPRES) tablet 0.1 mg    Orders Placed This Encounter  Procedures  . DG Chest 2 View  . Vitamin B12  . Vitamin D, 25-hydroxy  . Magnesium  . CBC with Differential  . Comprehensive metabolic panel    Referral Orders  No referral(s) requested today    Raliegh Ip,  MSN, FNP-BC [facility] [address] [place], [place] [address] [phone]   Problem List Items Addressed This Visit      Cardiovascular and Mediastinum   Essential hypertension   Relevant Medications   hydrochlorothiazide (HYDRODIURIL) 25 MG tablet   losartan (COZAAR) 50  MG tablet    methocarbamol (ROBAXIN) 750 MG tablet   omeprazole (PRILOSEC) 40 MG capsule   tamsulosin (FLOMAX) 0.4 MG CAPS capsule    Other Visit Diagnoses    Cramps, extremity    -  Primary   Relevant Orders   Vitamin B12   Vitamin D, 25-hydroxy   Magnesium   Cough       Relevant Medications   amoxicillin-clavulanate (AUGMENTIN) 875-125 MG tablet   benzonatate (TESSALON) 100 MG capsule   Other Relevant Orders   DG Chest 2 View (Completed)   Cough productive of yellow sputum       Relevant Medications   amoxicillin-clavulanate (AUGMENTIN) 875-125 MG tablet   benzonatate (TESSALON) 100 MG capsule   Other Relevant Orders   DG Chest 2 View (Completed)   Night sweat       Relevant Orders   CBC with Differential   Comprehensive metabolic panel   Hypertensive urgency       Relevant Medications   hydrochlorothiazide (HYDRODIURIL) 25 MG tablet   losartan (COZAAR) 50 MG tablet   cloNIDine (CATAPRES) tablet 0.1 mg (Completed)   History of hepatitis C       Insomnia, unspecified type       Relevant Medications   traZODone (DESYREL) 50 MG tablet   Follow up       Healthcare maintenance       Relevant Orders   CBC with Differential   Comprehensive metabolic panel      Meds ordered this encounter  Medications  . amoxicillin-clavulanate (AUGMENTIN) 875-125 MG tablet    Sig: Take 1 tablet by mouth 2 (two) times daily for 7 days.    Dispense:  14 tablet    Refill:  0  . benzonatate (TESSALON) 100 MG capsule    Sig: Take 1 capsule (100 mg total) by mouth 2 (two) times daily as needed for cough.    Dispense:  20 capsule    Refill:  0  . Colchicine (MITIGARE) 0.6 MG CAPS    Sig: Take 0.6 mg by mouth daily as needed (gout flare).    Dispense:  30 capsule    Refill:  3  . hydrochlorothiazide (HYDRODIURIL) 25 MG tablet    Sig: Take 1 tablet (25 mg total) by mouth daily.    Dispense:  30 tablet    Refill:  3  . losartan (COZAAR) 50 MG tablet    Sig: Take 1 tablet (50 mg total) by mouth  daily.    Dispense:  30 tablet    Refill:  3  . methocarbamol (ROBAXIN) 750 MG tablet    Sig: Take 1 tablet (750 mg total) by mouth 2 (two) times daily as needed for muscle spasms.    Dispense:  60 tablet    Refill:  0  . omeprazole (PRILOSEC) 40 MG capsule    Sig: Take 1 capsule (40 mg total) by mouth daily.    Dispense:  30 capsule    Refill:  3  . tamsulosin (FLOMAX) 0.4 MG CAPS capsule    Sig: Take 1 capsule (0.4 mg total) by mouth daily.    Dispense:  30 capsule    Refill:  3  . traZODone (DESYREL) 50 MG tablet    Sig: Take 0.5-1 tablets (25-50 mg total) by mouth at bedtime as needed for sleep.    Dispense:  60 tablet    Refill:  3  . cloNIDine (CATAPRES) tablet 0.1 mg    Follow-up: No follow-ups on  file.    Azzie Glatter, FNP

## 2020-04-18 NOTE — Patient Instructions (Addendum)
Benzonatate capsules What is this medicine? BENZONATATE (ben ZOE na tate) is used to treat cough. This medicine may be used for other purposes; ask your health care provider or pharmacist if you have questions. COMMON BRAND NAME(S): Tessalon Perles, Zonatuss What should I tell my health care provider before I take this medicine? They need to know if you have any of these conditions:  kidney or liver disease  an unusual or allergic reaction to benzonatate, anesthetics, other medicines, foods, dyes, or preservatives  pregnant or trying to get pregnant  breast-feeding How should I use this medicine? Take this medicine by mouth with a glass of water. Follow the directions on the prescription label. Avoid breaking, chewing, or sucking the capsule, as this can cause serious side effects. Take your medicine at regular intervals. Do not take your medicine more often than directed. Talk to your pediatrician regarding the use of this medicine in children. While this drug may be prescribed for children as young as 14 years old for selected conditions, precautions do apply. Overdosage: If you think you have taken too much of this medicine contact a poison control center or emergency room at once. NOTE: This medicine is only for you. Do not share this medicine with others. What if I miss a dose? If you miss a dose, take it as soon as you can. If it is almost time for your next dose, take only that dose. Do not take double or extra doses. What may interact with this medicine? Do not take this medicine with any of the following medications:  MAOIs like Carbex, Eldepryl, Marplan, Nardil, and Parnate This list may not describe all possible interactions. Give your health care provider a list of all the medicines, herbs, non-prescription drugs, or dietary supplements you use. Also tell them if you smoke, drink alcohol, or use illegal drugs. Some items may interact with your medicine. What should I watch for  while using this medicine? Tell your doctor if your symptoms do not improve or if they get worse. If you have a high fever, skin rash, or headache, see your health care professional. You may get drowsy or dizzy. Do not drive, use machinery, or do anything that needs mental alertness until you know how this medicine affects you. Do not sit or stand up quickly, especially if you are an older patient. This reduces the risk of dizzy or fainting spells. What side effects may I notice from receiving this medicine? Side effects that you should report to your doctor or health care professional as soon as possible:  allergic reactions like skin rash, itching or hives, swelling of the face, lips, or tongue  breathing problems  chest pain  confusion or hallucinations  irregular heartbeat  numbness of mouth or throat  seizures Side effects that usually do not require medical attention (report to your doctor or health care professional if they continue or are bothersome):  burning feeling in the eyes  constipation  headache  nasal congestion  stomach upset This list may not describe all possible side effects. Call your doctor for medical advice about side effects. You may report side effects to FDA at 1-800-FDA-1088. Where should I keep my medicine? Keep out of the reach of children. Store at room temperature between 15 and 30 degrees C (59 and 86 degrees F). Keep tightly closed. Protect from light and moisture. Throw away any unused medicine after the expiration date. NOTE: This sheet is a summary. It may not cover all possible information.  If you have questions about this medicine, talk to your doctor, pharmacist, or health care provider.  2020 Elsevier/Gold Standard (2007-12-24 14:52:56) Amoxicillin; Clavulanic Acid Chewable Tablets What is this medicine? AMOXICILLIN; CLAVULANIC ACID (a mox i SIL in; KLAV yoo lan ic AS id) is a penicillin antibiotic. It treats some infections caused by  bacteria. It will not work for colds, the flu, or other viruses. This medicine may be used for other purposes; ask your health care provider or pharmacist if you have questions. COMMON BRAND NAME(S): Augmentin What should I tell my health care provider before I take this medicine? They need to know if you have any of these conditions:  bowel disease, like colitis  kidney disease  liver disease  mononucleosis  phenylketonuria  an unusual or allergic reaction to amoxicillin, penicillin, cephalosporin, other antibiotics, clavulanic acid, other medicines, foods, dyes, or preservatives  pregnant or trying to get pregnant  breast-feeding How should I use this medicine? Take this drug by mouth. Take it as directed on the prescription label at the same time every day. Chew or crush it completely before swallowing. Do not swallow tablets whole. You can take it with or without food. If it upsets your stomach, take it with food. Take all of this drug unless your health care provider tells you to stop it early. Keep taking it even if you think you are better. Talk to your health care provider about the use of this drug in children. While it may be prescribed for selected conditions, precautions do apply. Overdosage: If you think you have taken too much of this medicine contact a poison control center or emergency room at once. NOTE: This medicine is only for you. Do not share this medicine with others. What if I miss a dose? If you miss a dose, take it as soon as you can. If it is almost time for your next dose, take only that dose. Do not take double or extra doses. What may interact with this medicine?  allopurinol  anticoagulants  birth control pills  methotrexate  probenecid This list may not describe all possible interactions. Give your health care provider a list of all the medicines, herbs, non-prescription drugs, or dietary supplements you use. Also tell them if you smoke, drink  alcohol, or use illegal drugs. Some items may interact with your medicine. What should I watch for while using this medicine? Tell your doctor or healthcare provider if your symptoms do not improve. This medicine may cause serious skin reactions. They can happen weeks to months after starting the medicine. Contact your healthcare provider right away if you notice fevers or flu-like symptoms with a rash. The rash may be red or purple and then turn into blisters or peeling of the skin. Or, you might notice a red rash with swelling of the face, lips or lymph nodes in your neck or under your arms. Do not treat diarrhea with over the counter products. Contact your doctor if you have diarrhea that lasts more than 2 days or if it is severe and watery. If you have diabetes, you may get a false-positive result for sugar in your urine. Check with your doctor or healthcare provider. Birth control pills may not work properly while you are taking this medicine. Talk to your doctor about using an extra method of birth control. What side effects may I notice from receiving this medicine? Side effects that you should report to your doctor or health care professional as soon as possible:  allergic reactions like skin rash, itching or hives, swelling of the face, lips, or tongue  breathing problems  dark urine  fever or chills, sore throat  redness, blistering, peeling, or loosening of the skin, including inside the mouth  seizures  trouble passing urine or change in the amount of urine  unusual bleeding, bruising  unusually weak or tired  white patches or sores in the mouth or throat Side effects that usually do not require medical attention (report to your doctor or health care professional if they continue or are bothersome):  diarrhea  dizziness  headache  nausea, vomiting  stomach upset  vaginal or anal irritation This list may not describe all possible side effects. Call your doctor for  medical advice about side effects. You may report side effects to FDA at 1-800-FDA-1088. Where should I keep my medicine? Keep out of the reach of children and pets. Store at room temperature between 20 and 25 degrees C (68 and 77 degrees F). Throw away any unused drug after the expiration date. NOTE: This sheet is a summary. It may not cover all possible information. If you have questions about this medicine, talk to your doctor, pharmacist, or health care provider.  2020 Elsevier/Gold Standard (2019-06-08 08:58:41) Cough, Adult A cough helps to clear your throat and lungs. A cough may be a sign of an illness or another medical condition. An acute cough may only last 2-3 weeks, while a chronic cough may last 8 or more weeks. Many things can cause a cough. They include:  Germs (viruses or bacteria) that attack the airway.  Breathing in things that bother (irritate) your lungs.  Allergies.  Asthma.  Mucus that runs down the back of your throat (postnasal drip).  Smoking.  Acid backing up from the stomach into the tube that moves food from the mouth to the stomach (gastroesophageal reflux).  Some medicines.  Lung problems.  Other medical conditions, such as heart failure or a blood clot in the lung (pulmonary embolism). Follow these instructions at home: Medicines  Take over-the-counter and prescription medicines only as told by your doctor.  Talk with your doctor before you take medicines that stop a cough (coughsuppressants). Lifestyle   Do not smoke, and try not to be around smoke. Do not use any products that contain nicotine or tobacco, such as cigarettes, e-cigarettes, and chewing tobacco. If you need help quitting, ask your doctor.  Drink enough fluid to keep your pee (urine) pale yellow.  Avoid caffeine.  Do not drink alcohol if your doctor tells you not to drink. General instructions   Watch for any changes in your cough. Tell your doctor about them.  Always  cover your mouth when you cough.  Stay away from things that make you cough, such as perfume, candles, campfire smoke, or cleaning products.  If the air is dry, use a cool mist vaporizer or humidifier in your home.  If your cough is worse at night, try using extra pillows to raise your head up higher while you sleep.  Rest as needed.  Keep all follow-up visits as told by your doctor. This is important. Contact a doctor if:  You have new symptoms.  You cough up pus.  Your cough does not get better after 2-3 weeks, or your cough gets worse.  Cough medicine does not help your cough and you are not sleeping well.  You have pain that gets worse or pain that is not helped with medicine.  You have a  fever.  You are losing weight and you do not know why.  You have night sweats. Get help right away if:  You cough up blood.  You have trouble breathing.  Your heartbeat is very fast. These symptoms may be an emergency. Do not wait to see if the symptoms will go away. Get medical help right away. Call your local emergency services (911 in the [place] Do not drive yourself to the hospital. Summary  A cough helps to clear your throat and lungs. Many things can cause a cough.  Take over-the-counter and prescription medicines only as told by your doctor.  Always cover your mouth when you cough.  Contact a doctor if you have new symptoms or you have a cough that does not get better or gets worse. This information is not intended to replace advice given to you by your health care provider. Make sure you discuss any questions you have with your health care provider. Document Revised: 10/13/2018 Document Reviewed: 10/13/2018 Elsevier Patient Education  2020 [employer].

## 2020-04-19 ENCOUNTER — Encounter: Payer: Self-pay | Admitting: Family Medicine

## 2020-04-19 LAB — MAGNESIUM: Magnesium: 1.9 mg/dL (ref 1.6–2.3)

## 2020-04-19 LAB — CBC WITH DIFFERENTIAL/PLATELET
Basophils Absolute: 0.1 10*3/uL (ref 0.0–0.2)
Basos: 1 %
EOS (ABSOLUTE): 0 10*3/uL (ref 0.0–0.4)
Eos: 1 %
Hematocrit: 39.1 % (ref 37.5–51.0)
Hemoglobin: 13.4 g/dL (ref 13.0–17.7)
Immature Grans (Abs): 0 10*3/uL (ref 0.0–0.1)
Immature Granulocytes: 1 %
Lymphocytes Absolute: 1.4 10*3/uL (ref 0.7–3.1)
Lymphs: 38 %
MCH: 34.1 pg — ABNORMAL HIGH (ref 26.6–33.0)
MCHC: 34.3 g/dL (ref 31.5–35.7)
MCV: 100 fL — ABNORMAL HIGH (ref 79–97)
Monocytes Absolute: 0.8 10*3/uL (ref 0.1–0.9)
Monocytes: 23 %
Neutrophils Absolute: 1.3 10*3/uL — ABNORMAL LOW (ref 1.4–7.0)
Neutrophils: 36 %
Platelets: 218 10*3/uL (ref 150–450)
RBC: 3.93 x10E6/uL — ABNORMAL LOW (ref 4.14–5.80)
RDW: 13.3 % (ref 11.6–15.4)
WBC: 3.7 10*3/uL (ref 3.4–10.8)

## 2020-04-19 LAB — VITAMIN B12: Vitamin B-12: 339 pg/mL (ref 232–1245)

## 2020-04-19 LAB — VITAMIN D 25 HYDROXY (VIT D DEFICIENCY, FRACTURES): Vit D, 25-Hydroxy: 18.7 ng/mL — ABNORMAL LOW (ref 30.0–100.0)

## 2020-05-11 ENCOUNTER — Encounter: Payer: Self-pay | Admitting: Family Medicine

## 2020-05-11 ENCOUNTER — Ambulatory Visit (INDEPENDENT_AMBULATORY_CARE_PROVIDER_SITE_OTHER): Payer: Medicaid Other | Admitting: Family Medicine

## 2020-05-11 ENCOUNTER — Other Ambulatory Visit: Payer: Self-pay

## 2020-05-11 VITALS — BP 168/96 | HR 89 | Temp 98.6°F | Resp 17 | Ht 71.0 in | Wt 127.6 lb

## 2020-05-11 DIAGNOSIS — I160 Hypertensive urgency: Secondary | ICD-10-CM | POA: Diagnosis not present

## 2020-05-11 DIAGNOSIS — Z8619 Personal history of other infectious and parasitic diseases: Secondary | ICD-10-CM

## 2020-05-11 DIAGNOSIS — Z09 Encounter for follow-up examination after completed treatment for conditions other than malignant neoplasm: Secondary | ICD-10-CM

## 2020-05-11 DIAGNOSIS — R252 Cramp and spasm: Secondary | ICD-10-CM | POA: Diagnosis not present

## 2020-05-11 DIAGNOSIS — F419 Anxiety disorder, unspecified: Secondary | ICD-10-CM | POA: Diagnosis not present

## 2020-05-11 DIAGNOSIS — I10 Essential (primary) hypertension: Secondary | ICD-10-CM

## 2020-05-11 MED ORDER — BUSPIRONE HCL 10 MG PO TABS: 10.0000 mg | ORAL_TABLET | Freq: Two times a day (BID) | ORAL | 3 refills | Status: AC

## 2020-05-11 NOTE — Progress Notes (Signed)
[facility]   Established Patient Office Visit  Subjective:  Patient ID: Charles [name], male    DOB: 1961/01/28  Age: 59 y.o. MRN: [id number]  CC:  Chief Complaint  Patient presents with  . Follow-up    Pt states he is here for his f/u about an X-ray he took. Pt  states he would like to discuss it with her. Pt states he is coughing up mucus [id number].    HPI Charles [name] is a 59 year old male who presents for Follow Up today.    Patient Active Problem List   Diagnosis Date Noted  . History of total right knee replacement   . Status post right knee replacement 07/14/2018  . Primary osteoarthritis of right knee   . Gout 05/26/2018  . Chronic pain of right knee 04/29/2018  . Surgery, elective 03/10/2018  . Status post hip replacement, left 03/10/2018  . Syphilis 01/01/2018  . Presbyopia 01/01/2018  . Chronic viral hepatitis C (HCC) 12/04/2017  . AVN (avascular necrosis of bone) (HCC) 12/04/2017  . Essential hypertension 07/20/2017  . CHF (congestive heart failure) (HCC) 07/20/2017  . Chronic obstructive pulmonary disease (HCC) 07/20/2017    Past Medical History:  Diagnosis Date  . Allergy    seasonal-pollen   . Asthma   . 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 CHF (congestive heart failure) (HCC) 2018  . COPD (chronic obstructive pulmonary disease) (HCC)   . GERD (gastroesophageal reflux disease)   . Gout   . Gout   . Headache    headaches  . Hepatitis C virus carrier state (HCC)   . History of total left hip arthroplasty 03/10/2018  . History of total right knee replacement 2019  . Hypertension   . Insomnia   . Osteochondrosis   . Primary localized osteoarthritis of hip    Left  . Vitamin D deficiency 04/2020   Current Status: Since his last office visit, he is doing well with no complaints. Patient has elevated blood pressure readings today, which he states that he has not taken his blood pressure  medication as of yet today. He denies fevers, chills, fatigue, recent infections, weight loss, and night sweats. He has not had any headaches, visual changes, dizziness, and falls. No chest pain, heart palpitations, cough and shortness of breath reported. Denies GI problems such as nausea, vomiting, diarrhea, and constipation. He has no reports of blood in stools, dysuria and hematuria. No depression or anxiety, and denies suicidal ideations, homicidal ideations, or auditory hallucinations. He is taking all medications as prescribed. He denies pain today.   Past Surgical History:  Procedure Laterality Date  . TOOTH EXTRACTION N/A 09/23/2018   Procedure: REMOVAL BILATERAL MANDIBULAR LINGUAL TORI, EXTRACT TOOTH 31;  Surgeon: Ocie DoyneJensen, Scott, DDS;  Location: [facility] OR;  Service: Oral Surgery;  Laterality: N/A;  . TOTAL HIP ARTHROPLASTY Left 03/10/2018   Procedure: LEFT TOTAL HIP ARTHROPLASTY ANTERIOR APPROACH;  Surgeon: Tarry KosXu, Naiping M, MD;  Location: [facility] OR;  Service: Orthopedics;  Laterality: Left;  . TOTAL KNEE ARTHROPLASTY Right 07/14/2018   Procedure: RIGHT TOTAL KNEE ARTHROPLASTY;  Surgeon: Tarry KosXu, Naiping M, MD;  Location: [facility] OR;  Service: Orthopedics;  Laterality: Right;    Family History  Problem Relation Age of Onset  . Hypertension Mother   . Hypertension Sister        twin  . Seizures Brother   . Schizophrenia Sister   . Colon  cancer Neg Hx   . Colon polyps Neg Hx   . Esophageal cancer Neg Hx   . Stomach cancer Neg Hx   . Rectal cancer Neg Hx     Social History   Socioeconomic History  . Marital status: Single    Spouse name: Not on file  . Number of children: Not on file  . Years of education: Not on file  . Highest education level: Not on file  Occupational History  . Not on file  Tobacco Use  . Smoking status: Current Every Day Smoker    Packs/day: 0.20  . Smokeless tobacco: Never Used  . Tobacco comment: 3 -4 cigarettes a day  Vaping Use  . Vaping Use: Never used  Substance  and Sexual Activity  . Alcohol use: Yes    Alcohol/week: 7.0 standard drinks    Types: 7 Cans of beer per week    Comment: heavy drinker in the past  . Drug use: Yes    Types: Marijuana    Comment: 07/14/2018 - last dose 07/12/2018; last dose 01/27/20  . Sexual activity: Not Currently  Other Topics Concern  . Not on file  Social History Narrative  . Not on file   Social Determinants of Health   Financial Resource Strain:   . Difficulty of Paying Living Expenses:   Food Insecurity:   . Worried About Programme researcher, broadcasting/film/video in the Last Year:   . Barista in the Last Year:   Transportation Needs:   . Freight forwarder (Medical):   [name] Lack of Transportation (Non-Medical):   Physical Activity:   . Days of Exercise per Week:   . Minutes of Exercise per Session:   Stress:   . Feeling of Stress :   Social Connections:   . Frequency of Communication with Friends and Family:   . Frequency of Social Gatherings with Friends and Family:   . Attends Religious Services:   . Active Member of Clubs or Organizations:   . Attends Banker Meetings:   [name] Marital Status:   Intimate Partner Violence:   . Fear of Current or Ex-Partner:   . Emotionally Abused:   [name] Physically Abused:   . Sexually Abused:     Outpatient Medications Prior to Visit  Medication Sig Dispense Refill  . albuterol (VENTOLIN HFA) 108 (90 Base) MCG/ACT inhaler Inhale 2 puffs into the lungs every 4 (four) hours as needed for wheezing or shortness of breath (cough, shortness of breath or wheezing.). 18 g 11  . allopurinol (ZYLOPRIM) 100 MG tablet Take 1 tablet (100 mg total) by mouth 2 (two) times daily. 60 tablet 3  . aspirin EC 81 MG tablet Take 1 tablet (81 mg total) by mouth 2 (two) times daily. 84 tablet 11  . benzonatate (TESSALON) 100 MG capsule Take 1 capsule (100 mg total) by mouth 2 (two) times daily as needed for cough. 20 capsule 0  . budesonide-formoterol (SYMBICORT) 80-4.5 MCG/ACT inhaler  Inhale 2 puffs into the lungs 2 (two) times daily. 1 Inhaler 11  . cetirizine (ZYRTEC) 10 MG tablet Take 1 tablet (10 mg total) by mouth daily. 30 tablet 11  . Colchicine (MITIGARE) 0.6 MG CAPS Take 0.6 mg by mouth daily as needed (gout flare). 30 capsule 3  . hydrochlorothiazide (HYDRODIURIL) 25 MG tablet Take 1 tablet (25 mg total) by mouth daily. 30 tablet 3  . ketoconazole (NIZORAL) 2 % cream Apply 2 application topically 2 (two) times daily.  15 g 3  . losartan (COZAAR) 50 MG tablet Take 1 tablet (50 mg total) by mouth daily. 30 tablet 3  . methocarbamol (ROBAXIN) 750 MG tablet Take 1 tablet (750 mg total) by mouth 2 (two) times daily as needed for muscle spasms. 60 tablet 0  . omeprazole (PRILOSEC) 40 MG capsule Take 1 capsule (40 mg total) by mouth daily. 30 capsule 3  . tamsulosin (FLOMAX) 0.4 MG CAPS capsule Take 1 capsule (0.4 mg total) by mouth daily. 30 capsule 3  . traZODone (DESYREL) 50 MG tablet Take 0.5-1 tablets (25-50 mg total) by mouth at bedtime as needed for sleep. 60 tablet 3   No facility-administered medications prior to visit.    Allergies  Allergen Reactions  . Bee Pollen     ROS Review of Systems    Objective:    Physical Exam Vitals and nursing note reviewed.  Constitutional:      Appearance: Normal appearance.  HENT:     Head: Normocephalic and atraumatic.     Nose: Nose normal.     Mouth/Throat:     Mouth: Mucous membranes are moist.     Pharynx: Oropharynx is clear.  Cardiovascular:     Rate and Rhythm: Normal rate and regular rhythm.     Pulses: Normal pulses.     Heart sounds: Normal heart sounds.  Pulmonary:     Effort: Pulmonary effort is normal.     Breath sounds: Normal breath sounds.  Abdominal:     General: Abdomen is flat. Bowel sounds are normal.  Musculoskeletal:        General: Swelling present.     Cervical back: Normal range of motion and neck supple.  Skin:    General: Skin is warm and dry.  Neurological:     General: No  focal deficit present.     Mental Status: He is alert and oriented to person, place, and time.  Psychiatric:        Mood and Affect: Mood normal.        Behavior: Behavior normal.        Thought Content: Thought content normal.        Judgment: Judgment normal.     BP (!) 168/96 (BP Location: Left Arm, Patient Position: Sitting, Cuff Size: Normal)   Pulse 89   Temp 98.6 F (37 C)   Resp 17   Ht 5\' 11"  (1.803 m)   Wt 127 lb 9.6 oz (57.9 kg)   SpO2 98%   BMI 17.80 kg/m  Wt Readings from Last 3 Encounters:  05/11/20 127 lb 9.6 oz (57.9 kg)  04/18/20 122 lb 9.6 oz (55.6 kg)  02/25/20 119 lb (54 kg)     Health Maintenance Due  Topic Date Due  . COVID-19 Vaccine (1) Never done  . COLONOSCOPY  Never done  . INFLUENZA VACCINE  05/08/2020    There are no preventive care reminders to display for this patient.  Lab Results  Component Value Date   TSH 0.691 01/18/2020   Lab Results  Component Value Date   WBC 3.7 04/18/2020   HGB 13.4 04/18/2020   HCT 39.1 04/18/2020   MCV 100 (H) 04/18/2020   PLT 218 04/18/2020   Lab Results  Component Value Date   NA 140 01/18/2020   K 4.2 01/18/2020   CO2 26 01/18/2020   GLUCOSE 88 01/18/2020   BUN 8 01/18/2020   CREATININE 0.69 (L) 01/18/2020   BILITOT 0.8 01/18/2020   ALKPHOS 97  01/18/2020   AST 69 (H) 01/18/2020   ALT 45 (H) 01/18/2020   PROT 7.8 01/18/2020   ALBUMIN 4.4 01/18/2020   CALCIUM 9.4 01/18/2020   ANIONGAP 15 09/23/2018   Lab Results  Component Value Date   CHOL 177 01/18/2020   Lab Results  Component Value Date   HDL 105 01/18/2020   Lab Results  Component Value Date   LDLCALC 49 01/18/2020   Lab Results  Component Value Date   TRIG 139 01/18/2020   Lab Results  Component Value Date   CHOLHDL 1.7 01/18/2020   Lab Results  Component Value Date   HGBA1C 4.6 01/18/2020      Assessment & Plan:   1. Anxiety - busPIRone (BUSPAR) 10 MG tablet; Take 1 tablet (10 mg total) by mouth 2 (two)  times daily.  Dispense: 60 tablet; Refill: 3  2. Hypertensive urgency  3. Essential hypertension Hew will begin taking his hypertensive medications as prescribed.   4. History of hepatitis C We will refer him back to [facility] for treatment.   5. Cramps, extremity  6. Follow up He will follow up in 3 months.  Meds ordered this encounter  Medications  . busPIRone (BUSPAR) 10 MG tablet    Sig: Take 1 tablet (10 mg total) by mouth 2 (two) times daily.    Dispense:  60 tablet    Refill:  3    No orders of the defined types were placed in this encounter.   Referral Orders  No referral(s) requested today    Raliegh Ip,  MSN, FNP-BC [facility] [address] [place], [place] [address] [phone] fax   Problem List Items Addressed This Visit      Cardiovascular and Mediastinum   Essential hypertension    Other Visit Diagnoses    Anxiety    -  Primary   Relevant Medications   busPIRone (BUSPAR) 10 MG tablet   Hypertensive urgency       History of hepatitis C       Cramps, extremity       Follow up          Meds ordered this encounter  Medications  . busPIRone (BUSPAR) 10 MG tablet    Sig: Take 1 tablet (10 mg total) by mouth 2 (two) times daily.    Dispense:  60 tablet    Refill:  3    Follow-up: Return in about 3 months (around 08/11/2020).    Kallie Locks, FNP

## 2020-05-11 NOTE — Patient Instructions (Signed)
Generalized Anxiety Disorder, Adult Generalized anxiety disorder (GAD) is a mental health disorder. People with this condition constantly worry about everyday events. Unlike normal anxiety, worry related to GAD is not triggered by a specific event. These worries also do not fade or get better with time. GAD interferes with life functions, including relationships, work, and school. GAD can vary from mild to severe. People with severe GAD can have intense waves of anxiety with physical symptoms (panic attacks). What are the causes? The exact cause of GAD is not known. What increases the risk? This condition is more likely to develop in:  Women.  People who have a family history of anxiety disorders.  People who are very shy.  People who experience very stressful life events, such as the death of a loved one.  People who have a very stressful family environment. What are the signs or symptoms? People with GAD often worry excessively about many things in their lives, such as their health and family. They may also be overly concerned about:  Doing well at work.  Being on time.  Natural disasters.  Friendships. Physical symptoms of GAD include:  Fatigue.  Muscle tension or having muscle twitches.  Trembling or feeling shaky.  Being easily startled.  Feeling like your heart is pounding or racing.  Feeling out of breath or like you cannot take a deep breath.  Having trouble falling asleep or staying asleep.  Sweating.  Nausea, diarrhea, or irritable bowel syndrome (IBS).  Headaches.  Trouble concentrating or remembering facts.  Restlessness.  Irritability. How is this diagnosed? Your health care provider can diagnose GAD based on your symptoms and medical history. You will also have a physical exam. The health care provider will ask specific questions about your symptoms, including how severe they are, when they started, and if they come and go. Your health care  provider may ask you about your use of alcohol or drugs, including prescription medicines. Your health care provider may refer you to a mental health specialist for further evaluation. Your health care provider will do a thorough examination and may perform additional tests to rule out other possible causes of your symptoms. To be diagnosed with GAD, a person must have anxiety that:  Is out of his or her control.  Affects several different aspects of his or her life, such as work and relationships.  Causes distress that makes him or her unable to take part in normal activities.  Includes at least three physical symptoms of GAD, such as restlessness, fatigue, trouble concentrating, irritability, muscle tension, or sleep problems. Before your health care provider can confirm a diagnosis of GAD, these symptoms must be present more days than they are not, and they must last for six months or longer. How is this treated? The following therapies are usually used to treat GAD:  Medicine. Antidepressant medicine is usually prescribed for long-term daily control. Antianxiety medicines may be added in severe cases, especially when panic attacks occur.  Talk therapy (psychotherapy). Certain types of talk therapy can be helpful in treating GAD by providing support, education, and guidance. Options include: ? Cognitive behavioral therapy (CBT). People learn coping skills and techniques to ease their anxiety. They learn to identify unrealistic or negative thoughts and behaviors and to replace them with positive ones. ? Acceptance and commitment therapy (ACT). This treatment teaches people how to be mindful as a way to cope with unwanted thoughts and feelings. ? Biofeedback. This process trains you to manage your body's response (  physiological response) through breathing techniques and relaxation methods. You will work with a therapist while machines are used to monitor your physical symptoms.  Stress  management techniques. These include yoga, meditation, and exercise. A mental health specialist can help determine which treatment is best for you. Some people see improvement with one type of therapy. However, other people require a combination of therapies. Follow these instructions at home:  Take over-the-counter and prescription medicines only as told by your health care provider.  Try to maintain a normal routine.  Try to anticipate stressful situations and allow extra time to manage them.  Practice any stress management or self-calming techniques as taught by your health care provider.  Do not punish yourself for setbacks or for not making progress.  Try to recognize your accomplishments, even if they are small.  Keep all follow-up visits as told by your health care provider. This is important. Contact a health care provider if:  Your symptoms do not get better.  Your symptoms get worse.  You have signs of depression, such as: ? A persistently sad, cranky, or irritable mood. ? Loss of enjoyment in activities that used to bring you joy. ? Change in weight or eating. ? Changes in sleeping habits. ? Avoiding friends or family members. ? Loss of energy for normal tasks. ? Feelings of guilt or worthlessness. Get help right away if:  You have serious thoughts about hurting yourself or others. If you ever feel like you may hurt yourself or others, or have thoughts about taking your own life, get help right away. You can go to your nearest emergency department or call:  Your local emergency services (911 in the [place]  A suicide crisis helpline, such as the National Suicide Prevention Lifeline at 1-800-273-8255. This is open 24 hours a day. Summary  Generalized anxiety disorder (GAD) is a mental health disorder that involves worry that is not triggered by a specific event.  People with GAD often worry excessively about many things in their lives, such as their health and  family.  GAD may cause physical symptoms such as restlessness, trouble concentrating, sleep problems, frequent sweating, nausea, diarrhea, headaches, and trembling or muscle twitching.  A mental health specialist can help determine which treatment is best for you. Some people see improvement with one type of therapy. However, other people require a combination of therapies. This information is not intended to replace advice given to you by your health care provider. Make sure you discuss any questions you have with your health care provider. Document Revised: 09/06/2017 Document Reviewed: 08/14/2016 Elsevier Patient Education  2020 [employer]   Buspirone tablets What is this medicine? BUSPIRONE (byoo SPYE rone) is used to treat anxiety disorders. This medicine may be used for other purposes; ask your health care provider or pharmacist if you have questions. COMMON BRAND NAME(S): BuSpar What should I tell my health care provider before I take this medicine? They need to know if you have any of these conditions:  kidney or liver disease  an unusual or allergic reaction to buspirone, other medicines, foods, dyes, or preservatives  pregnant or trying to get pregnant  breast-feeding How should I use this medicine? Take this medicine by mouth with a glass of water. Follow the directions on the prescription label. You may take this medicine with or without food. To ensure that this medicine always works the same way for you, you should take it either always with or always without food. Take your doses at   regular intervals. Do not take your medicine more often than directed. Do not stop taking except on the advice of your doctor or health care professional. Talk to your pediatrician regarding the use of this medicine in children. Special care may be needed. Overdosage: If you think you have taken too much of this medicine contact a poison control center or emergency room at once. NOTE: This  medicine is only for you. Do not share this medicine with others. What if I miss a dose? If you miss a dose, take it as soon as you can. If it is almost time for your next dose, take only that dose. Do not take double or extra doses. What may interact with this medicine? Do not take this medicine with any of the following medications:  linezolid  MAOIs like Carbex, Eldepryl, Marplan, Nardil, and Parnate  methylene blue  procarbazine This medicine may also interact with the following medications:  diazepam  digoxin  diltiazem  erythromycin  grapefruit juice  haloperidol  medicines for mental depression or mood problems  medicines for seizures like carbamazepine, phenobarbital and phenytoin  nefazodone  other medications for anxiety  rifampin  ritonavir  some antifungal medicines like itraconazole, ketoconazole, and voriconazole  verapamil  warfarin This list may not describe all possible interactions. Give your health care provider a list of all the medicines, herbs, non-prescription drugs, or dietary supplements you use. Also tell them if you smoke, drink alcohol, or use illegal drugs. Some items may interact with your medicine. What should I watch for while using this medicine? Visit your doctor or health care professional for regular checks on your progress. It may take 1 to 2 weeks before your anxiety gets better. You may get drowsy or dizzy. Do not drive, use machinery, or do anything that needs mental alertness until you know how this drug affects you. Do not stand or sit up quickly, especially if you are an older patient. This reduces the risk of dizzy or fainting spells. Alcohol can make you more drowsy and dizzy. Avoid alcoholic drinks. What side effects may I notice from receiving this medicine? Side effects that you should report to your doctor or health care professional as soon as possible:  blurred vision or other vision changes  chest  pain  confusion  difficulty breathing  feelings of hostility or anger  muscle aches and pains  numbness or tingling in hands or feet  ringing in the ears  skin rash and itching  vomiting  weakness Side effects that usually do not require medical attention (report to your doctor or health care professional if they continue or are bothersome):  disturbed dreams, nightmares  headache  nausea  restlessness or nervousness  sore throat and nasal congestion  stomach upset This list may not describe all possible side effects. Call your doctor for medical advice about side effects. You may report side effects to FDA at 1-800-FDA-1088. Where should I keep my medicine? Keep out of the reach of children. Store at room temperature below 30 degrees C (86 degrees F). Protect from light. Keep container tightly closed. Throw away any unused medicine after the expiration date. NOTE: This sheet is a summary. It may not cover all possible information. If you have questions about this medicine, talk to your doctor, pharmacist, or health care provider.  2020 Elsevier/Gold Standard (2010-05-04 18:06:11)  

## 2020-05-17 ENCOUNTER — Encounter: Payer: Self-pay | Admitting: Family Medicine

## 2020-05-25 DIAGNOSIS — H04123 Dry eye syndrome of bilateral lacrimal glands: Secondary | ICD-10-CM | POA: Diagnosis not present

## 2020-05-25 DIAGNOSIS — H1045 Other chronic allergic conjunctivitis: Secondary | ICD-10-CM | POA: Diagnosis not present

## 2020-05-25 DIAGNOSIS — H40013 Open angle with borderline findings, low risk, bilateral: Secondary | ICD-10-CM | POA: Diagnosis not present

## 2020-05-25 DIAGNOSIS — H35033 Hypertensive retinopathy, bilateral: Secondary | ICD-10-CM | POA: Diagnosis not present

## 2020-05-25 DIAGNOSIS — H0288B Meibomian gland dysfunction left eye, upper and lower eyelids: Secondary | ICD-10-CM | POA: Diagnosis not present

## 2020-05-25 DIAGNOSIS — H527 Unspecified disorder of refraction: Secondary | ICD-10-CM | POA: Diagnosis not present

## 2020-05-25 DIAGNOSIS — H0288A Meibomian gland dysfunction right eye, upper and lower eyelids: Secondary | ICD-10-CM | POA: Diagnosis not present

## 2020-05-25 DIAGNOSIS — H0102A Squamous blepharitis right eye, upper and lower eyelids: Secondary | ICD-10-CM | POA: Diagnosis not present

## 2020-05-25 DIAGNOSIS — H0102B Squamous blepharitis left eye, upper and lower eyelids: Secondary | ICD-10-CM | POA: Diagnosis not present

## 2020-05-25 DIAGNOSIS — H2513 Age-related nuclear cataract, bilateral: Secondary | ICD-10-CM | POA: Diagnosis not present

## 2020-05-28 ENCOUNTER — Other Ambulatory Visit: Payer: Self-pay | Admitting: Family Medicine

## 2020-05-28 DIAGNOSIS — R059 Cough, unspecified: Secondary | ICD-10-CM

## 2020-05-28 DIAGNOSIS — R058 Other specified cough: Secondary | ICD-10-CM

## 2020-06-06 ENCOUNTER — Telehealth: Payer: Self-pay | Admitting: Family Medicine

## 2020-06-06 NOTE — Telephone Encounter (Signed)
Continual requests from [facility] for refills of Benzonatate.

## 2020-08-12 ENCOUNTER — Ambulatory Visit: Payer: Medicaid Other | Admitting: Family Medicine

## 2020-08-31 ENCOUNTER — Ambulatory Visit: Payer: Medicaid Other | Admitting: Family Medicine

## 2021-01-30 ENCOUNTER — Other Ambulatory Visit: Payer: Self-pay | Admitting: Family Medicine

## 2021-01-30 DIAGNOSIS — I10 Essential (primary) hypertension: Secondary | ICD-10-CM

## 2021-03-14 DIAGNOSIS — Z681 Body mass index (BMI) 19 or less, adult: Secondary | ICD-10-CM | POA: Diagnosis not present

## 2021-03-14 DIAGNOSIS — J449 Chronic obstructive pulmonary disease, unspecified: Secondary | ICD-10-CM | POA: Diagnosis not present

## 2021-03-14 DIAGNOSIS — R030 Elevated blood-pressure reading, without diagnosis of hypertension: Secondary | ICD-10-CM | POA: Diagnosis not present

## 2021-03-14 DIAGNOSIS — Z1339 Encounter for screening examination for other mental health and behavioral disorders: Secondary | ICD-10-CM | POA: Diagnosis not present

## 2021-03-14 DIAGNOSIS — I10 Essential (primary) hypertension: Secondary | ICD-10-CM | POA: Diagnosis not present

## 2021-03-14 DIAGNOSIS — G4700 Insomnia, unspecified: Secondary | ICD-10-CM | POA: Diagnosis not present

## 2021-03-14 DIAGNOSIS — K219 Gastro-esophageal reflux disease without esophagitis: Secondary | ICD-10-CM | POA: Diagnosis not present

## 2021-03-28 DIAGNOSIS — Z681 Body mass index (BMI) 19 or less, adult: Secondary | ICD-10-CM | POA: Diagnosis not present

## 2021-03-28 DIAGNOSIS — R0602 Shortness of breath: Secondary | ICD-10-CM | POA: Diagnosis not present

## 2021-03-28 DIAGNOSIS — Z1159 Encounter for screening for other viral diseases: Secondary | ICD-10-CM | POA: Diagnosis not present

## 2021-03-28 DIAGNOSIS — Z125 Encounter for screening for malignant neoplasm of prostate: Secondary | ICD-10-CM | POA: Diagnosis not present

## 2021-03-28 DIAGNOSIS — J449 Chronic obstructive pulmonary disease, unspecified: Secondary | ICD-10-CM | POA: Diagnosis not present

## 2021-03-28 DIAGNOSIS — R030 Elevated blood-pressure reading, without diagnosis of hypertension: Secondary | ICD-10-CM | POA: Diagnosis not present

## 2021-03-28 DIAGNOSIS — Z0000 Encounter for general adult medical examination without abnormal findings: Secondary | ICD-10-CM | POA: Diagnosis not present

## 2021-03-28 DIAGNOSIS — I10 Essential (primary) hypertension: Secondary | ICD-10-CM | POA: Diagnosis not present

## 2021-04-18 DIAGNOSIS — E876 Hypokalemia: Secondary | ICD-10-CM | POA: Diagnosis not present

## 2021-04-18 DIAGNOSIS — E7800 Pure hypercholesterolemia, unspecified: Secondary | ICD-10-CM | POA: Diagnosis not present

## 2021-04-18 DIAGNOSIS — Z681 Body mass index (BMI) 19 or less, adult: Secondary | ICD-10-CM | POA: Diagnosis not present

## 2021-04-18 DIAGNOSIS — I10 Essential (primary) hypertension: Secondary | ICD-10-CM | POA: Diagnosis not present

## 2021-04-18 DIAGNOSIS — B1920 Unspecified viral hepatitis C without hepatic coma: Secondary | ICD-10-CM | POA: Diagnosis not present

## 2021-05-11 DIAGNOSIS — R945 Abnormal results of liver function studies: Secondary | ICD-10-CM | POA: Diagnosis not present

## 2021-05-11 DIAGNOSIS — K648 Other hemorrhoids: Secondary | ICD-10-CM | POA: Diagnosis not present

## 2021-05-11 DIAGNOSIS — I10 Essential (primary) hypertension: Secondary | ICD-10-CM | POA: Diagnosis not present

## 2021-05-11 DIAGNOSIS — R768 Other specified abnormal immunological findings in serum: Secondary | ICD-10-CM | POA: Diagnosis not present

## 2021-05-11 DIAGNOSIS — Z7901 Long term (current) use of anticoagulants: Secondary | ICD-10-CM | POA: Diagnosis not present

## 2021-05-19 DIAGNOSIS — E876 Hypokalemia: Secondary | ICD-10-CM | POA: Diagnosis not present

## 2021-05-19 DIAGNOSIS — Z681 Body mass index (BMI) 19 or less, adult: Secondary | ICD-10-CM | POA: Diagnosis not present

## 2021-05-19 DIAGNOSIS — I10 Essential (primary) hypertension: Secondary | ICD-10-CM | POA: Diagnosis not present

## 2021-05-19 DIAGNOSIS — J449 Chronic obstructive pulmonary disease, unspecified: Secondary | ICD-10-CM | POA: Diagnosis not present

## 2021-05-19 DIAGNOSIS — R030 Elevated blood-pressure reading, without diagnosis of hypertension: Secondary | ICD-10-CM | POA: Diagnosis not present

## 2021-05-23 DIAGNOSIS — Z1211 Encounter for screening for malignant neoplasm of colon: Secondary | ICD-10-CM | POA: Diagnosis not present

## 2021-05-23 DIAGNOSIS — K635 Polyp of colon: Secondary | ICD-10-CM | POA: Diagnosis not present

## 2021-05-31 DIAGNOSIS — K7400 Hepatic fibrosis, unspecified: Secondary | ICD-10-CM | POA: Diagnosis not present

## 2021-05-31 DIAGNOSIS — R768 Other specified abnormal immunological findings in serum: Secondary | ICD-10-CM | POA: Diagnosis not present

## 2021-05-31 DIAGNOSIS — K635 Polyp of colon: Secondary | ICD-10-CM | POA: Diagnosis not present

## 2021-06-06 DIAGNOSIS — K7400 Hepatic fibrosis, unspecified: Secondary | ICD-10-CM | POA: Diagnosis not present

## 2021-06-06 DIAGNOSIS — D127 Benign neoplasm of rectosigmoid junction: Secondary | ICD-10-CM | POA: Diagnosis not present

## 2021-06-14 DIAGNOSIS — R1319 Other dysphagia: Secondary | ICD-10-CM | POA: Diagnosis not present

## 2021-06-14 DIAGNOSIS — C19 Malignant neoplasm of rectosigmoid junction: Secondary | ICD-10-CM | POA: Diagnosis not present

## 2021-06-14 DIAGNOSIS — K635 Polyp of colon: Secondary | ICD-10-CM | POA: Diagnosis not present

## 2021-06-14 DIAGNOSIS — K219 Gastro-esophageal reflux disease without esophagitis: Secondary | ICD-10-CM | POA: Diagnosis not present

## 2021-06-20 DIAGNOSIS — R1319 Other dysphagia: Secondary | ICD-10-CM | POA: Diagnosis not present

## 2021-06-20 DIAGNOSIS — K219 Gastro-esophageal reflux disease without esophagitis: Secondary | ICD-10-CM | POA: Diagnosis not present

## 2021-06-26 DIAGNOSIS — K2270 Barrett's esophagus without dysplasia: Secondary | ICD-10-CM | POA: Diagnosis not present

## 2021-06-27 DIAGNOSIS — K200 Eosinophilic esophagitis: Secondary | ICD-10-CM | POA: Diagnosis not present

## 2021-09-22 IMAGING — DX DG CHEST 2V
2 series · 2 of 2 positions shown · non-contrast
Comparison: Chest x-ray 09/06/2017.

CLINICAL DATA: 58-year-old male with history of pneumonia.
Increased productive cough, yellow sputum and night sweats for the
past 3 months.

EXAM:
CHEST - 2 VIEW

[chest pa]
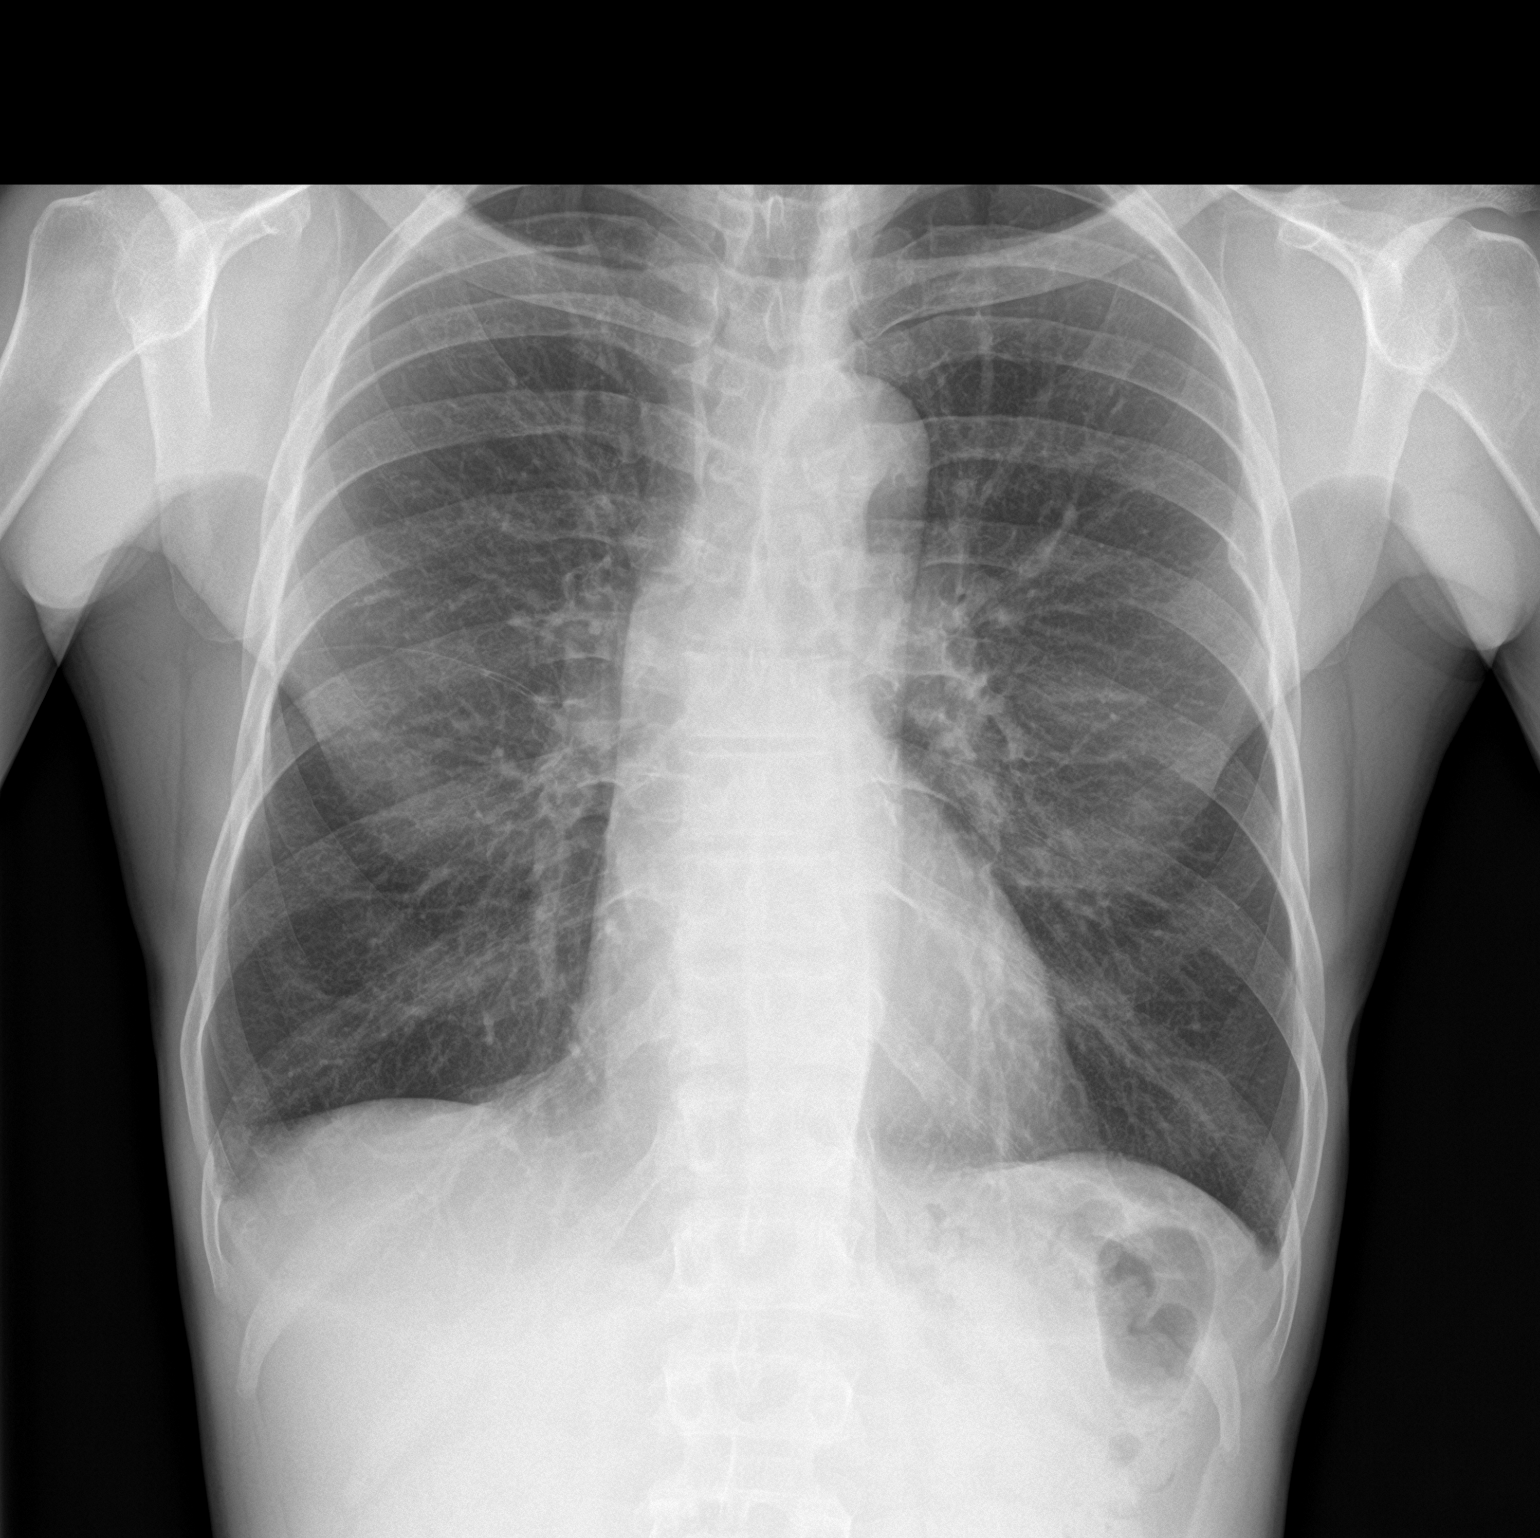

[chest lat]
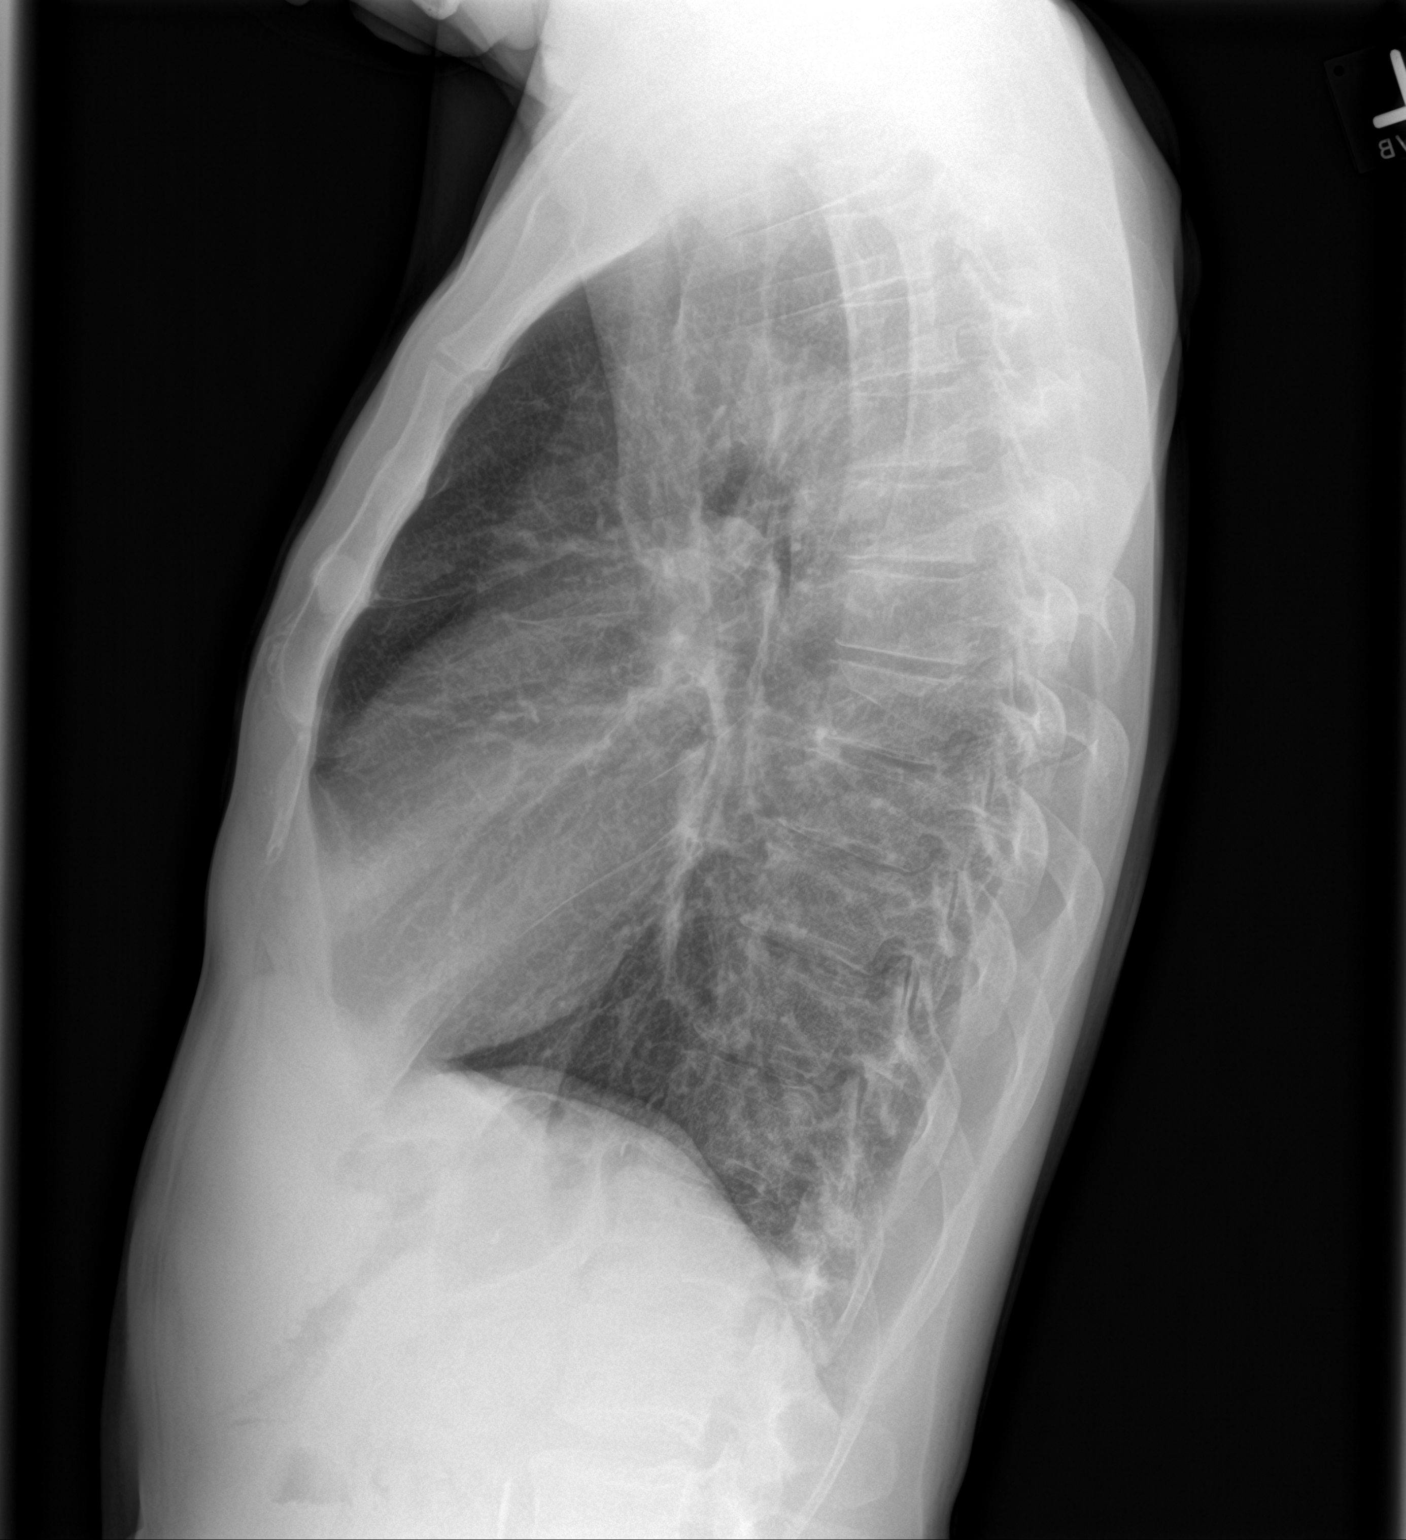

[2 of 2 positions shown; findings below may reference images not displayed]

FINDINGS: Lung volumes are normal. No consolidative airspace disease. No
pleural effusions. No pneumothorax. No pulmonary nodule or mass
noted. Pulmonary vasculature and the cardiomediastinal silhouette
are within normal limits.
IMPRESSION: No radiographic evidence of acute cardiopulmonary disease.

## 2021-12-07 DIAGNOSIS — K2270 Barrett's esophagus without dysplasia: Secondary | ICD-10-CM | POA: Diagnosis not present

## 2021-12-07 DIAGNOSIS — K2940 Chronic atrophic gastritis without bleeding: Secondary | ICD-10-CM | POA: Diagnosis not present

## 2021-12-07 DIAGNOSIS — D126 Benign neoplasm of colon, unspecified: Secondary | ICD-10-CM | POA: Diagnosis not present

## 2022-05-04 DIAGNOSIS — D124 Benign neoplasm of descending colon: Secondary | ICD-10-CM | POA: Diagnosis not present

## 2022-05-04 DIAGNOSIS — Z86010 Personal history of colonic polyps: Secondary | ICD-10-CM | POA: Diagnosis not present

## 2022-05-04 DIAGNOSIS — D120 Benign neoplasm of cecum: Secondary | ICD-10-CM | POA: Diagnosis not present

## 2022-05-04 DIAGNOSIS — D126 Benign neoplasm of colon, unspecified: Secondary | ICD-10-CM | POA: Diagnosis not present

## 2022-05-04 DIAGNOSIS — C20 Malignant neoplasm of rectum: Secondary | ICD-10-CM | POA: Diagnosis not present

## 2022-05-04 DIAGNOSIS — D122 Benign neoplasm of ascending colon: Secondary | ICD-10-CM | POA: Diagnosis not present

## 2022-05-10 DIAGNOSIS — K7400 Hepatic fibrosis, unspecified: Secondary | ICD-10-CM | POA: Diagnosis not present

## 2022-05-10 DIAGNOSIS — K2270 Barrett's esophagus without dysplasia: Secondary | ICD-10-CM | POA: Diagnosis not present

## 2022-05-10 DIAGNOSIS — D126 Benign neoplasm of colon, unspecified: Secondary | ICD-10-CM | POA: Diagnosis not present

## 2022-05-10 DIAGNOSIS — R768 Other specified abnormal immunological findings in serum: Secondary | ICD-10-CM | POA: Diagnosis not present

## 2022-06-05 DIAGNOSIS — Z87891 Personal history of nicotine dependence: Secondary | ICD-10-CM | POA: Diagnosis not present

## 2022-06-07 DIAGNOSIS — D099 Carcinoma in situ, unspecified: Secondary | ICD-10-CM | POA: Diagnosis not present

## 2022-06-07 DIAGNOSIS — J449 Chronic obstructive pulmonary disease, unspecified: Secondary | ICD-10-CM | POA: Diagnosis not present

## 2022-06-07 DIAGNOSIS — I499 Cardiac arrhythmia, unspecified: Secondary | ICD-10-CM | POA: Diagnosis not present

## 2022-06-07 DIAGNOSIS — N400 Enlarged prostate without lower urinary tract symptoms: Secondary | ICD-10-CM | POA: Diagnosis not present

## 2022-06-07 DIAGNOSIS — K219 Gastro-esophageal reflux disease without esophagitis: Secondary | ICD-10-CM | POA: Diagnosis not present

## 2022-06-07 DIAGNOSIS — I10 Essential (primary) hypertension: Secondary | ICD-10-CM | POA: Diagnosis not present

## 2022-07-16 DIAGNOSIS — A539 Syphilis, unspecified: Secondary | ICD-10-CM | POA: Diagnosis not present

## 2022-07-16 DIAGNOSIS — D099 Carcinoma in situ, unspecified: Secondary | ICD-10-CM | POA: Diagnosis not present

## 2022-07-16 DIAGNOSIS — Z789 Other specified health status: Secondary | ICD-10-CM | POA: Diagnosis not present

## 2022-07-16 DIAGNOSIS — I499 Cardiac arrhythmia, unspecified: Secondary | ICD-10-CM | POA: Diagnosis not present

## 2022-07-16 DIAGNOSIS — R0609 Other forms of dyspnea: Secondary | ICD-10-CM | POA: Diagnosis not present

## 2022-07-16 DIAGNOSIS — Z720 Tobacco use: Secondary | ICD-10-CM | POA: Diagnosis not present

## 2022-07-16 DIAGNOSIS — I10 Essential (primary) hypertension: Secondary | ICD-10-CM | POA: Diagnosis not present

## 2022-07-16 DIAGNOSIS — Z01810 Encounter for preprocedural cardiovascular examination: Secondary | ICD-10-CM | POA: Diagnosis not present

## 2022-07-16 DIAGNOSIS — B182 Chronic viral hepatitis C: Secondary | ICD-10-CM | POA: Diagnosis not present

## 2022-07-21 DIAGNOSIS — Z01810 Encounter for preprocedural cardiovascular examination: Secondary | ICD-10-CM | POA: Diagnosis not present

## 2022-07-21 DIAGNOSIS — R0609 Other forms of dyspnea: Secondary | ICD-10-CM | POA: Diagnosis not present

## 2022-08-02 DIAGNOSIS — I10 Essential (primary) hypertension: Secondary | ICD-10-CM | POA: Diagnosis not present

## 2022-08-02 DIAGNOSIS — Z01810 Encounter for preprocedural cardiovascular examination: Secondary | ICD-10-CM | POA: Diagnosis not present

## 2022-08-02 DIAGNOSIS — Z720 Tobacco use: Secondary | ICD-10-CM | POA: Diagnosis not present

## 2022-08-02 DIAGNOSIS — R0609 Other forms of dyspnea: Secondary | ICD-10-CM | POA: Diagnosis not present

## 2022-08-16 DIAGNOSIS — C19 Malignant neoplasm of rectosigmoid junction: Secondary | ICD-10-CM | POA: Diagnosis not present

## 2022-08-16 DIAGNOSIS — R1319 Other dysphagia: Secondary | ICD-10-CM | POA: Diagnosis not present

## 2022-08-16 DIAGNOSIS — I4719 Other supraventricular tachycardia: Secondary | ICD-10-CM | POA: Diagnosis not present

## 2022-08-16 DIAGNOSIS — K2270 Barrett's esophagus without dysplasia: Secondary | ICD-10-CM | POA: Diagnosis not present

## 2022-09-28 DIAGNOSIS — K2270 Barrett's esophagus without dysplasia: Secondary | ICD-10-CM | POA: Diagnosis not present

## 2022-10-14 DIAGNOSIS — K200 Eosinophilic esophagitis: Secondary | ICD-10-CM | POA: Diagnosis not present

## 2022-10-18 DIAGNOSIS — D099 Carcinoma in situ, unspecified: Secondary | ICD-10-CM | POA: Diagnosis not present

## 2022-10-25 DIAGNOSIS — J432 Centrilobular emphysema: Secondary | ICD-10-CM | POA: Diagnosis not present

## 2022-10-25 DIAGNOSIS — K760 Fatty (change of) liver, not elsewhere classified: Secondary | ICD-10-CM | POA: Diagnosis not present

## 2022-10-25 DIAGNOSIS — N281 Cyst of kidney, acquired: Secondary | ICD-10-CM | POA: Diagnosis not present

## 2022-10-25 DIAGNOSIS — J8410 Pulmonary fibrosis, unspecified: Secondary | ICD-10-CM | POA: Diagnosis not present

## 2022-10-25 DIAGNOSIS — I700 Atherosclerosis of aorta: Secondary | ICD-10-CM | POA: Diagnosis not present

## 2022-10-25 DIAGNOSIS — J941 Fibrothorax: Secondary | ICD-10-CM | POA: Diagnosis not present

## 2022-11-20 DIAGNOSIS — K640 First degree hemorrhoids: Secondary | ICD-10-CM | POA: Diagnosis not present

## 2022-11-20 DIAGNOSIS — D010 Carcinoma in situ of colon: Secondary | ICD-10-CM | POA: Diagnosis not present

## 2022-11-20 DIAGNOSIS — D123 Benign neoplasm of transverse colon: Secondary | ICD-10-CM | POA: Diagnosis not present

## 2022-11-20 DIAGNOSIS — K6389 Other specified diseases of intestine: Secondary | ICD-10-CM | POA: Diagnosis not present

## 2022-11-20 DIAGNOSIS — K635 Polyp of colon: Secondary | ICD-10-CM | POA: Diagnosis not present

## 2022-11-21 DIAGNOSIS — C187 Malignant neoplasm of sigmoid colon: Secondary | ICD-10-CM | POA: Diagnosis not present

## 2022-11-21 DIAGNOSIS — N400 Enlarged prostate without lower urinary tract symptoms: Secondary | ICD-10-CM | POA: Diagnosis not present

## 2022-11-21 DIAGNOSIS — K6389 Other specified diseases of intestine: Secondary | ICD-10-CM | POA: Diagnosis not present

## 2022-11-21 DIAGNOSIS — Z466 Encounter for fitting and adjustment of urinary device: Secondary | ICD-10-CM | POA: Diagnosis not present

## 2022-11-23 DIAGNOSIS — D010 Carcinoma in situ of colon: Secondary | ICD-10-CM | POA: Diagnosis not present

## 2022-11-23 DIAGNOSIS — I10 Essential (primary) hypertension: Secondary | ICD-10-CM | POA: Diagnosis not present

## 2022-11-23 DIAGNOSIS — N3289 Other specified disorders of bladder: Secondary | ICD-10-CM | POA: Diagnosis not present

## 2022-11-23 DIAGNOSIS — D62 Acute posthemorrhagic anemia: Secondary | ICD-10-CM | POA: Diagnosis not present

## 2022-11-23 DIAGNOSIS — Z9049 Acquired absence of other specified parts of digestive tract: Secondary | ICD-10-CM | POA: Diagnosis not present

## 2022-11-24 DIAGNOSIS — Z9049 Acquired absence of other specified parts of digestive tract: Secondary | ICD-10-CM | POA: Diagnosis not present

## 2022-11-24 DIAGNOSIS — J449 Chronic obstructive pulmonary disease, unspecified: Secondary | ICD-10-CM | POA: Diagnosis not present

## 2022-11-24 DIAGNOSIS — D127 Benign neoplasm of rectosigmoid junction: Secondary | ICD-10-CM | POA: Diagnosis not present

## 2022-11-24 DIAGNOSIS — D62 Acute posthemorrhagic anemia: Secondary | ICD-10-CM | POA: Diagnosis not present

## 2022-11-24 DIAGNOSIS — I509 Heart failure, unspecified: Secondary | ICD-10-CM | POA: Diagnosis not present

## 2022-11-24 DIAGNOSIS — I110 Hypertensive heart disease with heart failure: Secondary | ICD-10-CM | POA: Diagnosis not present

## 2022-11-25 DIAGNOSIS — D127 Benign neoplasm of rectosigmoid junction: Secondary | ICD-10-CM | POA: Diagnosis not present

## 2022-11-25 DIAGNOSIS — I110 Hypertensive heart disease with heart failure: Secondary | ICD-10-CM | POA: Diagnosis not present

## 2022-11-25 DIAGNOSIS — R42 Dizziness and giddiness: Secondary | ICD-10-CM | POA: Diagnosis not present

## 2022-11-25 DIAGNOSIS — D62 Acute posthemorrhagic anemia: Secondary | ICD-10-CM | POA: Diagnosis not present

## 2022-11-25 DIAGNOSIS — Z9049 Acquired absence of other specified parts of digestive tract: Secondary | ICD-10-CM | POA: Diagnosis not present

## 2022-11-25 DIAGNOSIS — I509 Heart failure, unspecified: Secondary | ICD-10-CM | POA: Diagnosis not present

## 2022-11-25 DIAGNOSIS — J449 Chronic obstructive pulmonary disease, unspecified: Secondary | ICD-10-CM | POA: Diagnosis not present

## 2022-11-26 DIAGNOSIS — J449 Chronic obstructive pulmonary disease, unspecified: Secondary | ICD-10-CM | POA: Diagnosis not present

## 2022-11-26 DIAGNOSIS — D098 Carcinoma in situ of other specified sites: Secondary | ICD-10-CM | POA: Diagnosis not present

## 2022-11-26 DIAGNOSIS — I509 Heart failure, unspecified: Secondary | ICD-10-CM | POA: Diagnosis not present

## 2022-11-26 DIAGNOSIS — D62 Acute posthemorrhagic anemia: Secondary | ICD-10-CM | POA: Diagnosis not present

## 2022-11-26 DIAGNOSIS — I110 Hypertensive heart disease with heart failure: Secondary | ICD-10-CM | POA: Diagnosis not present

## 2022-11-28 ENCOUNTER — Telehealth: Payer: Self-pay | Admitting: *Deleted

## 2022-11-28 DIAGNOSIS — Z483 Aftercare following surgery for neoplasm: Secondary | ICD-10-CM | POA: Diagnosis not present

## 2022-11-28 NOTE — Transitions of Care (Post Inpatient/ED Visit) (Signed)
   11/28/2022  Name: Charles [name] MRN: [id number] DOB: [birth year]  Today's TOC FU Call Status: Today's TOC FU Call Status:: Unsuccessul Call (1st Attempt) Unsuccessful Call (1st Attempt) Date: 11/28/22  Attempted to reach the patient regarding the most recent Inpatient/ED visit.  Follow Up Plan: Additional outreach attempts will be made to reach the patient to complete the Transitions of Care (Post Inpatient/ED visit) call.   Lurena Joiner RN, BSN [place]  [employer] manager

## 2022-12-12 DIAGNOSIS — C189 Malignant neoplasm of colon, unspecified: Secondary | ICD-10-CM | POA: Diagnosis not present

## 2022-12-19 DIAGNOSIS — C189 Malignant neoplasm of colon, unspecified: Secondary | ICD-10-CM | POA: Diagnosis not present

## 2022-12-26 DIAGNOSIS — C189 Malignant neoplasm of colon, unspecified: Secondary | ICD-10-CM | POA: Diagnosis not present

## 2023-02-14 DIAGNOSIS — C19 Malignant neoplasm of rectosigmoid junction: Secondary | ICD-10-CM | POA: Diagnosis not present

## 2023-02-14 DIAGNOSIS — K700 Alcoholic fatty liver: Secondary | ICD-10-CM | POA: Diagnosis not present

## 2023-04-17 DIAGNOSIS — Z125 Encounter for screening for malignant neoplasm of prostate: Secondary | ICD-10-CM | POA: Diagnosis not present

## 2023-04-17 DIAGNOSIS — D485 Neoplasm of uncertain behavior of skin: Secondary | ICD-10-CM | POA: Diagnosis not present

## 2023-04-17 DIAGNOSIS — C19 Malignant neoplasm of rectosigmoid junction: Secondary | ICD-10-CM | POA: Diagnosis not present

## 2023-04-17 DIAGNOSIS — Z681 Body mass index (BMI) 19 or less, adult: Secondary | ICD-10-CM | POA: Diagnosis not present

## 2023-04-17 DIAGNOSIS — M109 Gout, unspecified: Secondary | ICD-10-CM | POA: Diagnosis not present

## 2023-04-17 DIAGNOSIS — K2270 Barrett's esophagus without dysplasia: Secondary | ICD-10-CM | POA: Diagnosis not present

## 2023-04-17 DIAGNOSIS — Z760 Encounter for issue of repeat prescription: Secondary | ICD-10-CM | POA: Diagnosis not present

## 2023-04-17 DIAGNOSIS — I10 Essential (primary) hypertension: Secondary | ICD-10-CM | POA: Diagnosis not present

## 2023-04-17 DIAGNOSIS — L089 Local infection of the skin and subcutaneous tissue, unspecified: Secondary | ICD-10-CM | POA: Diagnosis not present

## 2023-04-17 DIAGNOSIS — E7800 Pure hypercholesterolemia, unspecified: Secondary | ICD-10-CM | POA: Diagnosis not present

## 2023-04-17 DIAGNOSIS — J45909 Unspecified asthma, uncomplicated: Secondary | ICD-10-CM | POA: Diagnosis not present

## 2023-04-17 DIAGNOSIS — R634 Abnormal weight loss: Secondary | ICD-10-CM | POA: Diagnosis not present

## 2023-07-18 DIAGNOSIS — Z0000 Encounter for general adult medical examination without abnormal findings: Secondary | ICD-10-CM | POA: Diagnosis not present

## 2023-07-18 DIAGNOSIS — E7800 Pure hypercholesterolemia, unspecified: Secondary | ICD-10-CM | POA: Diagnosis not present

## 2023-07-18 DIAGNOSIS — Z125 Encounter for screening for malignant neoplasm of prostate: Secondary | ICD-10-CM | POA: Diagnosis not present

## 2023-07-18 DIAGNOSIS — E119 Type 2 diabetes mellitus without complications: Secondary | ICD-10-CM | POA: Diagnosis not present

## 2023-08-01 DIAGNOSIS — Z87891 Personal history of nicotine dependence: Secondary | ICD-10-CM | POA: Diagnosis not present

## 2023-12-05 DIAGNOSIS — Z85038 Personal history of other malignant neoplasm of large intestine: Secondary | ICD-10-CM | POA: Diagnosis not present

## 2024-01-14 DIAGNOSIS — K635 Polyp of colon: Secondary | ICD-10-CM | POA: Diagnosis not present

## 2024-01-23 DIAGNOSIS — Z681 Body mass index (BMI) 19 or less, adult: Secondary | ICD-10-CM | POA: Diagnosis not present

## 2024-01-23 DIAGNOSIS — E7800 Pure hypercholesterolemia, unspecified: Secondary | ICD-10-CM | POA: Diagnosis not present

## 2024-01-23 DIAGNOSIS — M109 Gout, unspecified: Secondary | ICD-10-CM | POA: Diagnosis not present

## 2024-01-23 DIAGNOSIS — C19 Malignant neoplasm of rectosigmoid junction: Secondary | ICD-10-CM | POA: Diagnosis not present

## 2024-01-23 DIAGNOSIS — M25562 Pain in left knee: Secondary | ICD-10-CM | POA: Diagnosis not present

## 2024-01-23 DIAGNOSIS — I10 Essential (primary) hypertension: Secondary | ICD-10-CM | POA: Diagnosis not present

## 2024-01-26 DIAGNOSIS — K635 Polyp of colon: Secondary | ICD-10-CM | POA: Diagnosis not present

## 2024-05-01 DIAGNOSIS — Z7951 Long term (current) use of inhaled steroids: Secondary | ICD-10-CM | POA: Diagnosis not present

## 2024-07-21 DIAGNOSIS — Z131 Encounter for screening for diabetes mellitus: Secondary | ICD-10-CM | POA: Diagnosis not present

## 2024-07-21 DIAGNOSIS — M109 Gout, unspecified: Secondary | ICD-10-CM | POA: Diagnosis not present

## 2024-07-21 DIAGNOSIS — E7800 Pure hypercholesterolemia, unspecified: Secondary | ICD-10-CM | POA: Diagnosis not present

## 2024-07-21 DIAGNOSIS — Z0000 Encounter for general adult medical examination without abnormal findings: Secondary | ICD-10-CM | POA: Diagnosis not present

## 2024-07-21 DIAGNOSIS — Z13220 Encounter for screening for lipoid disorders: Secondary | ICD-10-CM | POA: Diagnosis not present

## 2024-08-17 DIAGNOSIS — J449 Chronic obstructive pulmonary disease, unspecified: Secondary | ICD-10-CM | POA: Diagnosis not present
